# Patient Record
Sex: Female | Born: 1935 | Race: Black or African American | Hispanic: No | Smoking: Former smoker
Health system: Southern US, Community
[De-identification: ages and names within clinical notes are randomized; demographics above are authoritative.]

## PROBLEM LIST (undated history)

## (undated) DIAGNOSIS — F32A Depression, unspecified: Secondary | ICD-10-CM

## (undated) DIAGNOSIS — I209 Angina pectoris, unspecified: Secondary | ICD-10-CM

## (undated) DIAGNOSIS — D649 Anemia, unspecified: Secondary | ICD-10-CM

## (undated) DIAGNOSIS — R51 Headache: Secondary | ICD-10-CM

## (undated) DIAGNOSIS — N2581 Secondary hyperparathyroidism of renal origin: Secondary | ICD-10-CM

## (undated) DIAGNOSIS — I10 Essential (primary) hypertension: Secondary | ICD-10-CM

## (undated) DIAGNOSIS — G5600 Carpal tunnel syndrome, unspecified upper limb: Secondary | ICD-10-CM

## (undated) DIAGNOSIS — K219 Gastro-esophageal reflux disease without esophagitis: Secondary | ICD-10-CM

## (undated) DIAGNOSIS — Z992 Dependence on renal dialysis: Secondary | ICD-10-CM

## (undated) DIAGNOSIS — I639 Cerebral infarction, unspecified: Secondary | ICD-10-CM

## (undated) DIAGNOSIS — N186 End stage renal disease: Secondary | ICD-10-CM

## (undated) DIAGNOSIS — Z8719 Personal history of other diseases of the digestive system: Secondary | ICD-10-CM

## (undated) DIAGNOSIS — B1920 Unspecified viral hepatitis C without hepatic coma: Secondary | ICD-10-CM

## (undated) DIAGNOSIS — F329 Major depressive disorder, single episode, unspecified: Secondary | ICD-10-CM

## (undated) DIAGNOSIS — M1990 Unspecified osteoarthritis, unspecified site: Secondary | ICD-10-CM

## (undated) DIAGNOSIS — J189 Pneumonia, unspecified organism: Secondary | ICD-10-CM

## (undated) DIAGNOSIS — Z9981 Dependence on supplemental oxygen: Secondary | ICD-10-CM

## (undated) DIAGNOSIS — F419 Anxiety disorder, unspecified: Secondary | ICD-10-CM

## (undated) HISTORY — PX: ANKLE RECONSTRUCTION: SHX1151

## (undated) HISTORY — PX: ABDOMINAL HYSTERECTOMY: SHX81

## (undated) HISTORY — PX: CARDIAC CATHETERIZATION: SHX172

## (undated) HISTORY — PX: CATARACT EXTRACTION, BILATERAL: SHX1313

## (undated) HISTORY — PX: SP AV DIALYSIS SHUNT ACCESS EXISTING *L*: HXRAD383

---

## 2012-03-25 ENCOUNTER — Inpatient Hospital Stay: Admit: 2012-03-25 | Payer: Self-pay | Admitting: Family Medicine

## 2012-08-06 ENCOUNTER — Emergency Department (HOSPITAL_COMMUNITY): Payer: Medicare Other

## 2012-08-06 ENCOUNTER — Encounter (HOSPITAL_COMMUNITY): Payer: Self-pay

## 2012-08-06 ENCOUNTER — Inpatient Hospital Stay (HOSPITAL_COMMUNITY)
Admission: EM | Admit: 2012-08-06 | Discharge: 2012-08-11 | DRG: 539 | Disposition: A | Discharge status: Skilled Nursing Facility | Payer: Medicare Other | Attending: Internal Medicine | Admitting: Internal Medicine

## 2012-08-06 DIAGNOSIS — I959 Hypotension, unspecified: Secondary | ICD-10-CM | POA: Diagnosis not present

## 2012-08-06 DIAGNOSIS — Z91158 Patient's noncompliance with renal dialysis for other reason: Secondary | ICD-10-CM

## 2012-08-06 DIAGNOSIS — F329 Major depressive disorder, single episode, unspecified: Secondary | ICD-10-CM | POA: Diagnosis present

## 2012-08-06 DIAGNOSIS — G8929 Other chronic pain: Secondary | ICD-10-CM | POA: Diagnosis present

## 2012-08-06 DIAGNOSIS — I10 Essential (primary) hypertension: Secondary | ICD-10-CM

## 2012-08-06 DIAGNOSIS — M519 Unspecified thoracic, thoracolumbar and lumbosacral intervertebral disc disorder: Secondary | ICD-10-CM | POA: Diagnosis present

## 2012-08-06 DIAGNOSIS — N186 End stage renal disease: Secondary | ICD-10-CM | POA: Diagnosis present

## 2012-08-06 DIAGNOSIS — R29810 Facial weakness: Secondary | ICD-10-CM | POA: Diagnosis present

## 2012-08-06 DIAGNOSIS — D649 Anemia, unspecified: Secondary | ICD-10-CM | POA: Diagnosis present

## 2012-08-06 DIAGNOSIS — Z515 Encounter for palliative care: Secondary | ICD-10-CM

## 2012-08-06 DIAGNOSIS — I120 Hypertensive chronic kidney disease with stage 5 chronic kidney disease or end stage renal disease: Secondary | ICD-10-CM | POA: Diagnosis present

## 2012-08-06 DIAGNOSIS — F3289 Other specified depressive episodes: Secondary | ICD-10-CM | POA: Diagnosis present

## 2012-08-06 DIAGNOSIS — N2581 Secondary hyperparathyroidism of renal origin: Secondary | ICD-10-CM | POA: Diagnosis present

## 2012-08-06 DIAGNOSIS — F411 Generalized anxiety disorder: Secondary | ICD-10-CM | POA: Diagnosis not present

## 2012-08-06 DIAGNOSIS — Z992 Dependence on renal dialysis: Secondary | ICD-10-CM

## 2012-08-06 DIAGNOSIS — M869 Osteomyelitis, unspecified: Principal | ICD-10-CM | POA: Diagnosis present

## 2012-08-06 DIAGNOSIS — K219 Gastro-esophageal reflux disease without esophagitis: Secondary | ICD-10-CM | POA: Diagnosis present

## 2012-08-06 DIAGNOSIS — K5900 Constipation, unspecified: Secondary | ICD-10-CM | POA: Diagnosis not present

## 2012-08-06 DIAGNOSIS — Z79899 Other long term (current) drug therapy: Secondary | ICD-10-CM

## 2012-08-06 DIAGNOSIS — G5600 Carpal tunnel syndrome, unspecified upper limb: Secondary | ICD-10-CM | POA: Diagnosis present

## 2012-08-06 DIAGNOSIS — R29898 Other symptoms and signs involving the musculoskeletal system: Secondary | ICD-10-CM | POA: Diagnosis present

## 2012-08-06 DIAGNOSIS — M129 Arthropathy, unspecified: Secondary | ICD-10-CM | POA: Diagnosis present

## 2012-08-06 DIAGNOSIS — Z66 Do not resuscitate: Secondary | ICD-10-CM | POA: Diagnosis not present

## 2012-08-06 DIAGNOSIS — D72819 Decreased white blood cell count, unspecified: Secondary | ICD-10-CM | POA: Diagnosis present

## 2012-08-06 DIAGNOSIS — R531 Weakness: Secondary | ICD-10-CM

## 2012-08-06 DIAGNOSIS — I69998 Other sequelae following unspecified cerebrovascular disease: Secondary | ICD-10-CM

## 2012-08-06 DIAGNOSIS — M4640 Discitis, unspecified, site unspecified: Secondary | ICD-10-CM | POA: Diagnosis present

## 2012-08-06 DIAGNOSIS — M549 Dorsalgia, unspecified: Secondary | ICD-10-CM

## 2012-08-06 DIAGNOSIS — Z9115 Patient's noncompliance with renal dialysis: Secondary | ICD-10-CM

## 2012-08-06 DIAGNOSIS — Z87891 Personal history of nicotine dependence: Secondary | ICD-10-CM

## 2012-08-06 DIAGNOSIS — E875 Hyperkalemia: Secondary | ICD-10-CM | POA: Diagnosis present

## 2012-08-06 DIAGNOSIS — I330 Acute and subacute infective endocarditis: Secondary | ICD-10-CM | POA: Diagnosis present

## 2012-08-06 HISTORY — DX: Carpal tunnel syndrome, unspecified upper limb: G56.00

## 2012-08-06 HISTORY — DX: Secondary hyperparathyroidism of renal origin: N25.81

## 2012-08-06 HISTORY — DX: Gastro-esophageal reflux disease without esophagitis: K21.9

## 2012-08-06 HISTORY — DX: Essential (primary) hypertension: I10

## 2012-08-06 HISTORY — DX: End stage renal disease: N18.6

## 2012-08-06 LAB — RENAL FUNCTION PANEL
Calcium: 9.3 mg/dL (ref 8.4–10.5)
Creatinine, Ser: 9.61 mg/dL — ABNORMAL HIGH (ref 0.50–1.10)
GFR calc Af Amer: 4 mL/min — ABNORMAL LOW (ref >90)
GFR calc non Af Amer: 3 mL/min — ABNORMAL LOW (ref >90)
Glucose, Bld: 79 mg/dL (ref 70–99)
Phosphorus: 8.3 mg/dL — ABNORMAL HIGH (ref 2.3–4.6)
Sodium: 131 mEq/L — ABNORMAL LOW (ref 135–145)

## 2012-08-06 LAB — CBC
HCT: 31.2 % — ABNORMAL LOW (ref 36.0–46.0)
Hemoglobin: 10.3 g/dL — ABNORMAL LOW (ref 12.0–15.0)
MCV: 88.4 fL (ref 78.0–100.0)
WBC: 3.5 10*3/uL — ABNORMAL LOW (ref 4.0–10.5)

## 2012-08-06 LAB — BASIC METABOLIC PANEL
BUN: 81 mg/dL — ABNORMAL HIGH (ref 6–23)
CO2: 22 mEq/L (ref 19–32)
Chloride: 91 mEq/L — ABNORMAL LOW (ref 96–112)
Creatinine, Ser: 9.45 mg/dL — ABNORMAL HIGH (ref 0.50–1.10)
Glucose, Bld: 92 mg/dL (ref 70–99)
Potassium: 5.8 mEq/L — ABNORMAL HIGH (ref 3.5–5.1)

## 2012-08-06 LAB — HEPATIC FUNCTION PANEL
ALT: 29 U/L (ref 0–35)
AST: 16 U/L (ref 0–37)
Alkaline Phosphatase: 86 U/L (ref 39–117)
Bilirubin, Direct: 0.1 mg/dL (ref 0.0–0.3)
Total Bilirubin: 0.3 mg/dL (ref 0.3–1.2)

## 2012-08-06 MED ORDER — HYDROCODONE-ACETAMINOPHEN 5-325 MG PO TABS
1.0000 | ORAL_TABLET | ORAL | Status: DC | PRN
  Administered 2012-08-07: 2 via ORAL
  Filled 2012-08-06 (×2): qty 2

## 2012-08-06 MED ORDER — HEPARIN SODIUM (PORCINE) 5000 UNIT/ML IJ SOLN
5000.0000 [IU] | Freq: Three times a day (TID) | INTRAMUSCULAR | Status: DC
Start: 2012-08-06 — End: 2012-08-11
  Administered 2012-08-07 – 2012-08-10 (×3): 5000 [IU] via SUBCUTANEOUS
  Filled 2012-08-06 (×17): qty 1

## 2012-08-06 MED ORDER — ONDANSETRON HCL 4 MG/2ML IJ SOLN: 4.0000 mg | Freq: Three times a day (TID) | INTRAMUSCULAR | Status: AC | PRN

## 2012-08-06 MED ORDER — IOHEXOL 300 MG/ML  SOLN: 25.0000 mL | INTRAMUSCULAR | Status: AC

## 2012-08-06 MED ORDER — DARBEPOETIN ALFA-POLYSORBATE 60 MCG/0.3ML IJ SOLN
60.0000 ug | INTRAMUSCULAR | Status: DC
  Administered 2012-08-06: 60 ug via INTRAVENOUS
  Filled 2012-08-06: qty 0.3

## 2012-08-06 MED ORDER — SODIUM CHLORIDE 0.9 % IJ SOLN
3.0000 mL | Freq: Two times a day (BID) | INTRAMUSCULAR | Status: DC
  Administered 2012-08-07 – 2012-08-11 (×10): 3 mL via INTRAVENOUS

## 2012-08-06 MED ORDER — POLYETHYLENE GLYCOL 3350 17 G PO PACK
17.0000 g | PACK | Freq: Every day | ORAL | Status: DC | PRN
  Filled 2012-08-06: qty 1

## 2012-08-06 MED ORDER — HYDROMORPHONE HCL PF 1 MG/ML IJ SOLN: 1.0000 mg | Freq: Once | INTRAMUSCULAR | Status: DC

## 2012-08-06 MED ORDER — CALCIUM ACETATE 667 MG PO CAPS
2001.0000 mg | ORAL_CAPSULE | Freq: Three times a day (TID) | ORAL | Status: DC
  Administered 2012-08-07 – 2012-08-10 (×10): 2001 mg via ORAL
  Filled 2012-08-06 (×16): qty 3

## 2012-08-06 MED ORDER — SODIUM CHLORIDE 0.9 % IV BOLUS (SEPSIS)
500.0000 mL | Freq: Once | INTRAVENOUS | Status: AC
  Administered 2012-08-06: 14:00:00 via INTRAVENOUS

## 2012-08-06 MED ORDER — RENA-VITE PO TABS
1.0000 | ORAL_TABLET | Freq: Every day | ORAL | Status: DC
  Administered 2012-08-07 – 2012-08-08 (×2): 1 via ORAL
  Administered 2012-08-10: 18:00:00 via ORAL
  Filled 2012-08-06 (×6): qty 1

## 2012-08-06 MED ORDER — PIPERACILLIN-TAZOBACTAM 3.375 G IVPB: 3.3750 g | Freq: Once | INTRAVENOUS | Status: DC

## 2012-08-06 MED ORDER — SODIUM POLYSTYRENE SULFONATE 15 GM/60ML PO SUSP: 45.0000 g | Freq: Once | ORAL | Status: DC

## 2012-08-06 MED ORDER — HYDROMORPHONE HCL PF 1 MG/ML IJ SOLN
1.0000 mg | INTRAMUSCULAR | Status: AC | PRN
  Administered 2012-08-06 – 2012-08-07 (×3): 1 mg via INTRAVENOUS
  Filled 2012-08-06: qty 1

## 2012-08-06 MED ORDER — DOXERCALCIFEROL 4 MCG/2ML IV SOLN
4.0000 ug | INTRAVENOUS | Status: DC | PRN
  Administered 2012-08-06 – 2012-08-11 (×3): 4 ug via INTRAVENOUS
  Filled 2012-08-06: qty 2

## 2012-08-06 NOTE — ED Notes (Signed)
IV RN at bedside to attempt IV access.

## 2012-08-06 NOTE — ED Notes (Addendum)
Per [facility] pt c/o lower abd/back pain, increase back pain w/movement, dizziness and weakness x5 days. Pt usually receives dialysis Tuesday, Thursday, and Saturday but has only had dialysis Wednesday of this week due to "not feeling well." 22 g Right Hand, 12 non remarkable, CBG 85, VSS  Pt also c/o SOB w/exertion

## 2012-08-06 NOTE — ED Notes (Signed)
PIV replacement attempted by L. Shular, RN x 1: unsuccessful.  Pt declines further needle sticks.

## 2012-08-06 NOTE — ED Notes (Signed)
IV team will come to attempt IV placement

## 2012-08-06 NOTE — Progress Notes (Signed)
Received pt. From ED.Pt. Is from home alone.She uses a walker at home.pt. Alert and oriented.Pt.'s skin with out any skin sore.Keep monitoring pt. Closely and assessing her needs.

## 2012-08-06 NOTE — H&P (Addendum)
[facility] History and Physical  Julia Small FAO:[id number] DOB: 30-Oct-1935 DOA: 08/06/2012  Referring physician: Dr. Rosalia Hammers PCP: No primary provider on file.  Specialists: Neurosurgery, Nephrology  Chief Complaint: back pain  HPI: Julia Small is a 77 y.o. female has a past medical history significant for end-stage renal disease on hemodialysis TTS, with her last hemodialysis Saturday, also history of hypertension, previous CVA, GERD presents with a chief complaint of back pain and weakness for the past 5 days. Her complaints are somewhat diffuse, however endorses nausea, generalized weakness with difficulties walking and fatigue. She missed her dialysis sessions this week due to these complaints. She lives in [place]. She denies any fevers, endorses two episodes of chills in the past 2 days. She denies any chest pain, shortness of breath. She denies any abdominal pain. She has a small cough which is somewhat chronic, she's having troubles coughing because every time she tries to cough she has back pain. In the emergency room, patient is afebrile, initially tachycardic then her heart rate is in the 70s, initial blood pressure 84/52, improved after small 500cc bolus and now consistently > 100 systolic. Lab evaluation showed hyperkalemia at 5.8, BUN 81 , creatinine 9.4, mild leukopenia with a white count of 3.5, and mild anemia. Because of her diffuse GI complaints and back pain, she had a CT scan which showed findings worrisome for acute discitis/osteomyelitis of the T10-11 region. Hospitalist service has been asked to admit patient.  Review of Systems: As per history of present illness, otherwise negative.  Past Medical History  Diagnosis Date  . Renal disorder   . Hypertension   . GERD (gastroesophageal reflux disease)   . Arthritis   . Renal failure   . CVA (cerebral infarction)     Left side weakness and facial droop  . Carpal tunnel syndrome    Past Surgical History   Procedure Laterality Date  . Ankle reconstruction    . Cesarean section      three  . Sp av dialysis shunt access existing *l* Left   . Abdominal hysterectomy     Social History:  reports that she has quit smoking. She does not have any smokeless tobacco history on file. She reports that  drinks alcohol. She reports that she does not use illicit drugs.  No Known Allergies  History reviewed. No pertinent family history.   Prior to Admission medications   Medication Sig Start Date End Date Taking? Authorizing Provider  amLODipine (NORVASC) 10 MG tablet Take 10 mg by mouth daily.   Yes Historical Provider, MD  b complex-vitamin c-folic acid (NEPHRO-VITE) 0.8 MG TABS Take 0.8 mg by mouth daily.   Yes Historical Provider, MD  calcium acetate (PHOSLO) 667 MG capsule Take 1,334 mg by mouth 3 (three) times daily with meals.    Yes Historical Provider, MD  carvedilol (COREG) 6.25 MG tablet Take 6.25 mg by mouth 2 (two) times daily.   Yes Historical Provider, MD  cloNIDine (CATAPRES) 0.1 MG tablet Take 0.1 mg by mouth 2 (two) times daily.   Yes Historical Provider, MD  esomeprazole (NEXIUM) 40 MG capsule Take 40 mg by mouth daily.   Yes Historical Provider, MD  HYDROcodone-acetaminophen (NORCO) 7.5-325 MG per tablet Take 1 tablet by mouth 3 (three) times daily as needed for pain.    Yes Historical Provider, MD  ranitidine (ZANTAC) 150 MG tablet Take 150 mg by mouth 2 (two) times daily.   Yes Historical Provider, MD  rOPINIRole (REQUIP) 0.5 MG tablet  Take 0.5 mg by mouth daily.   Yes Historical Provider, MD  zolpidem (AMBIEN) 10 MG tablet Take 10 mg by mouth at bedtime as needed for sleep.   Yes Historical Provider, MD   Physical Exam: Filed Vitals:   08/06/12 1300 08/06/12 1315 08/06/12 1403 08/06/12 1415  BP: 115/66 108/68  126/78  Pulse: 72 74  73  Temp:   98.7 F (37.1 C)   TempSrc:   Rectal   Resp: 20 23  23   Height:      Weight:      SpO2: 96% 100%  97%     General:  No apparent  distress, pleasant African American woman  Eyes: no scleral icterus  ENT: moist oropharynx  Neck: supple, no JVD  Cardiovascular: regular rate without MRG; 2+ peripheral pulses  Respiratory: CTA biL, good air movement without wheezing, rhonchi or crackled  Abdomen: soft, non tender to palpation, positive bowel sounds, no guarding, no rebound  Skin: no rashes  Musculoskeletal: no peripheral edema  Psychiatric: normal mood and affect  Neurologic: Nonfocal  Labs on Admission:  Basic Metabolic Panel:  Recent Labs Lab 08/06/12 1312  NA 132*  K 5.8*  CL 91*  CO2 22  GLUCOSE 92  BUN 81*  CREATININE 9.45*  CALCIUM 9.7   Liver Function Tests:  Recent Labs Lab 08/06/12 1200  AST 16  ALT 29  ALKPHOS 86  BILITOT 0.3  PROT 7.6  ALBUMIN 2.9*   CBC:  Recent Labs Lab 08/06/12 1312  WBC 3.5*  HGB 10.3*  HCT 31.2*  MCV 88.4  PLT PLATELET CLUMPS NOTED ON SMEAR, UNABLE TO ESTIMATE   Radiological Exams on Admission: Ct Abdomen Pelvis Wo Contrast  08/06/2012   *RADIOLOGY REPORT*  Clinical Data: Diffuse abdominal and back pain  CT ABDOMEN AND PELVIS WITHOUT CONTRAST  Technique:  Multidetector CT imaging of the abdomen and pelvis was performed following the standard protocol without intravenous contrast.  Comparison: CT abdomen pelvis - 04/17/2008  Findings:  The lack of intravenous contrast limits the ability to evaluate solid abdominal organs.  There has been interval destruction of the T10 - T11 intervertebral disc space with associated increase sclerosis and irregularity of the adjacent endplates. There is minimal (approximately 5 mm) of herniated disc material towards the spinal canal (axial image 11, series 2) and approximately 25% loss of the T11 vertebral body height.  These findings are associated with development of adjacent paraspinal soft tissue stranding and thickening (images 8 through 13, series 2) and are worrisome for acute diskitis osteomyelitis.  Previously  levels of diskitis osteomyelitis at T9 - T10 and L4 - L5 appear grossly unchanged, however there is now near complete bony fusion of the T9 - T10 intervertebral disc space.  Normal hepatic contour.  Normal noncontrast appearance of the decompressed gallbladder.  No ascites.  The bilateral kidneys are atrophic compatible with provided history of end-stage renal disease.  There is a possible 1.6 x 1.9 cm hypoattenuating lesion arising from the superior pole of the left kidney which is incompletely evaluated on this noncontrast examination (image 23, series 2).  No definite urinary obstruction. No perinephric stranding.  There is mild diffuse thickening of the left adrenal gland without discrete nodule.  Normal noncontrast appearance of the right adrenal gland, pancreas and spleen.  Ingested enteric contrast extends to the level of the distal small bowel.  No evidence of enteric obstruction.  Bowel is normal in course and caliber without discrete area of wall thickening.  No pneumoperitoneum, pneumatosis or portal venous gas.  Atherosclerotic plaque within a tortuous and normal caliber abdominal aorta.  No definite bulky retroperitoneal, mesenteric, pelvic or inguinal lymphadenopathy on this noncontrast examination.  Post hysterectomy.  No discrete adnexal lesion.  No free fluid in the pelvis.  Limited visualization of the lower thorax demonstrates worsening bibasilar opacities.  No definite pleural effusion.  Cardiomegaly.  Calcifications within the mitral valve annulus.  No definite pericardial effusion.  Multiple ventral abdominal wall metallic sutures are redemonstrated.  IMPRESSION: 1.  Findings worrisome for acute diskitis osteomyelitis involving the T10 - T11 intervertebral disc space.  Further evaluation with MRI may be performed as clinically indicated.  2.  Grossly unchanged sequela of prior presumed areas of diskitis osteomyelitis involving the T9 - T10 and L4 - L5 intervertebral disc space levels.  3.   Cardiomegaly.  4.  Indeterminate approximately 1.7 cm left-sided hypoattenuating lesion, incompletely evaluated on this noncontrast examination but may represent a renal cyst.  Further evaluation with non emergent renal ultrasound may be performed as clinically indicated.  Above findings discussed with Geiple, PA at 1526.   Original Report Authenticated By: Tacey Ruiz, MD   Dg Chest 2 View  08/06/2012   *RADIOLOGY REPORT*  Clinical Data: Cough.  CHEST - 2 VIEW  Comparison: 03/27/2012  Findings: The cardiopericardial silhouette is enlarged.  Vascular congestion noted with patchy atelectasis or infiltrate bilaterally, left greater than right. Telemetry leads overlie the chest. Imaged bony structures of the thorax are intact.  IMPRESSION: No substantial change.  Cardiomegaly with vascular congestion and bilateral lower lung patchy atelectasis or infiltrate.   Original Report Authenticated By: Kennith Center, M.D.   Assessment/Plan Active Problems:   ESRD (end stage renal disease) on dialysis   Osteomyelitis   Diskitis   HTN (hypertension)   GERD (gastroesophageal reflux disease)    End-stage renal disease on hemodialysis - Patient missed her dialysis sessions this week, nephrology has been consulted, appreciate input. - I don't have access to her baseline labs; per patient dry weight is 133 lbs, now 135.  Hyperkalemia - Again I'm not sure about her regular potassium levels. EKG does show very mild peaked T waves. Will keep patient on telemetry. Kayexalate. HD per renal  Osteomyelitis/diskitis - Patient not toxic looking, now normotensive and afebrile, will hold antibiotics hopefully will be able to do CT guided biopsy prior to antibiotics. Talked to IR and plan for tomorrow morning 9 am. NPO past midnight.  - low threshold to start antibiotics if needed.  HTN - given initial hypotension we'll hold antihypertensives. They may need to be restarted soon.  Anemia - Of chronic kidney  disease.  Progressive weakness - somewhat acute on chronic, uses a walker at home - PT consult.   GERD - continue PPI  DVT Prophylaxis - heparin   Code Status: Full  Family Communication: none  Disposition Plan: inpatient.  Time spent: 24  Saina Waage M. Elvera Lennox, MD [facility] Pager [phone]  If 7PM-7AM, please contact night-coverage [email or web address] Password [facility] 08/06/2012, 4:28 PM

## 2012-08-06 NOTE — Procedures (Signed)
I was present at this session.  I have reviewed the session itself and made appropriate changes.  Mild dyspnea, vol xs. LUA AVF  Julia Small L 7/18/20146:26 PM

## 2012-08-06 NOTE — ED Provider Notes (Signed)
History    CSN: [id number] Arrival date & time 08/06/12  1145  First MD Initiated Contact with Patient 08/06/12 1225     Chief Complaint  Patient presents with  . Weakness   (Consider location/radiation/quality/duration/timing/severity/associated sxs/prior Treatment) HPI 77 y.o. Female on dialysis, h.o. Cva, hypertension here complaining of weakness for 5 days.  Patient states she feels sick with nausea and dizziness, difficulty walking due to generalized.  Patient normally goes dialysis T,Th, Saturday.  Patient went to dialysis Saturday.  She did not go to dialysis Tuesday due to feeling very weak.  She went Wednesday because she knew she had to. She states they told her they wouldn't have room for her today.  Patient goes to dialysis in [place].  Dr. Park Breed at [facility].  Nepghrology was [facility].  Patient endorses headache, cough, nausea, decreased po intake partly due to too weak to prepare meals, no fever known, no chills, no chest pain.  Patient does not make urine.  Patient has bilateral low back pain began about a week ago.  No trauma to back known.  Pain increases with movement.  No history of back pain or problems.   Past Medical History  Diagnosis Date  . Renal disorder   . Hypertension   . GERD (gastroesophageal reflux disease)   . Arthritis   . Renal failure   . CVA (cerebral infarction)     Left side weakness and facial droop  . Carpal tunnel syndrome    Past Surgical History  Procedure Laterality Date  . Ankle reconstruction    . Cesarean section      three  . Sp av dialysis shunt access existing *l* Left   . Abdominal hysterectomy     History reviewed. No pertinent family history. History  Substance Use Topics  . Smoking status: Former Games developer  . Smokeless tobacco: Not on file  . Alcohol Use: Yes     Comment: occasional   OB History   Grav Para Term Preterm Abortions TAB SAB Ect Mult Living   3 3 3       3      Review of Systems  All other systems reviewed  and are negative.    Allergies  Review of patient's allergies indicates no known allergies.  Home Medications   Current Outpatient Rx  Name  Route  Sig  Dispense  Refill  . AMLODIPINE BESYLATE PO   Oral   Take 2 mg by mouth.         . calcium acetate (PHOSLO) 667 MG capsule   Oral   Take 667 mg by mouth 3 (three) times daily with meals.         Marland Kitchen HYDROcodone-acetaminophen (NORCO) 7.5-325 MG per tablet   Oral   Take 1 tablet by mouth every 6 (six) hours as needed for pain.         Marland Kitchen oxyCODONE-acetaminophen (PERCOCET/ROXICET) 5-325 MG per tablet   Oral   Take 1 tablet by mouth every 4 (four) hours as needed for pain.         . ropinirole (REQUIP) 5 MG tablet   Oral   Take 5 mg by mouth at bedtime.         Marland Kitchen zolpidem (AMBIEN) 10 MG tablet   Oral   Take 10 mg by mouth at bedtime as needed for sleep.          BP 84/52  Pulse 70  Temp(Src) 97.8 F (36.6 C) (Oral)  Resp 14  Ht 4\' 10"  (1.473 m)  Wt 135 lb (61.236 kg)  BMI 28.22 kg/m2  SpO2 100% Physical Exam  Nursing note and vitals reviewed. Constitutional: She is oriented to person, place, and time. She appears well-developed and well-nourished.  HENT:  Head: Normocephalic and atraumatic.  Right Ear: Tympanic membrane and external ear normal.  Left Ear: Tympanic membrane and external ear normal.  Nose: Nose normal. Right sinus exhibits no maxillary sinus tenderness and no frontal sinus tenderness. Left sinus exhibits no maxillary sinus tenderness and no frontal sinus tenderness.  Mouth/Throat: Oropharynx is clear and moist.  Eyes: Conjunctivae and EOM are normal. Pupils are equal, round, and reactive to light. Right eye exhibits no nystagmus. Left eye exhibits no nystagmus.  Neck: Normal range of motion. Neck supple.  Cardiovascular: Normal rate, regular rhythm, normal heart sounds and intact distal pulses.   Pulmonary/Chest: Effort normal and breath sounds normal. No respiratory distress. She exhibits  no tenderness.  Abdominal: Soft. Bowel sounds are normal. She exhibits no distension and no mass. There is no tenderness.  Diffuse ttp  Musculoskeletal: Normal range of motion. She exhibits edema. She exhibits no tenderness.  Patient without pain on rom but states too weak to arom  Neurological: She is alert and oriented to person, place, and time. She has normal strength and normal reflexes. No sensory deficit. She displays a negative Romberg sign. GCS eye subscore is 4. GCS verbal subscore is 5. GCS motor subscore is 6.  Reflex Scores:      Tricep reflexes are 2+ on the right side and 2+ on the left side.      Bicep reflexes are 2+ on the right side and 2+ on the left side.      Brachioradialis reflexes are 2+ on the right side and 2+ on the left side.      Patellar reflexes are 2+ on the right side and 2+ on the left side.      Achilles reflexes are 2+ on the right side and 2+ on the left side. Strength is 4/5 bilateral hip flexors, knee, ankle and toe 5/5 bilaterally, no sensory deficit.  DTR equal bilateral knees and ankles.   Skin: Skin is warm and dry. No rash noted.  Psychiatric: Her speech is normal. Judgment and thought content normal. Her affect is angry. She is agitated. Cognition and memory are normal.    ED Course  Procedures (including critical care time) Labs Reviewed  CBC  BASIC METABOLIC PANEL   No results found. No diagnosis found. Results for orders placed during the hospital encounter of 08/06/12  CBC      Result Value Range   WBC 3.5 (*) 4.0 - 10.5 K/uL   RBC 3.53 (*) 3.87 - 5.11 MIL/uL   Hemoglobin 10.3 (*) 12.0 - 15.0 g/dL   HCT 56.2 (*) 13.0 - 86.5 %   MCV 88.4  78.0 - 100.0 fL   MCH 29.2  26.0 - 34.0 pg   MCHC 33.0  30.0 - 36.0 g/dL   RDW 78.4  69.6 - 29.5 %   Platelets PLATELET CLUMPS NOTED ON SMEAR, UNABLE TO ESTIMATE  150 - 400 K/uL  BASIC METABOLIC PANEL      Result Value Range   Sodium 132 (*) 135 - 145 mEq/L   Potassium 5.8 (*) 3.5 - 5.1 mEq/L    Chloride 91 (*) 96 - 112 mEq/L   CO2 22  19 - 32 mEq/L   Glucose, Bld 92  70 - 99 mg/dL  BUN 81 (*) 6 - 23 mg/dL   Creatinine, Ser 4.09 (*) 0.50 - 1.10 mg/dL   Calcium 9.7  8.4 - 81.1 mg/dL   GFR calc non Af Amer 4 (*) >90 mL/min   GFR calc Af Amer 4 (*) >90 mL/min  HEPATIC FUNCTION PANEL      Result Value Range   Total Protein 7.6  6.0 - 8.3 g/dL   Albumin 2.9 (*) 3.5 - 5.2 g/dL   AST 16  0 - 37 U/L   ALT 29  0 - 35 U/L   Alkaline Phosphatase 86  39 - 117 U/L   Total Bilirubin 0.3  0.3 - 1.2 mg/dL   Bilirubin, Direct 0.1  0.0 - 0.3 mg/dL   Indirect Bilirubin 0.2 (*) 0.3 - 0.9 mg/dL  CG4 I-STAT (LACTIC ACID)      Result Value Range   Lactic Acid, Venous 1.20  0.5 - 2.2 mmol/L   Filed Vitals:   08/06/12 1415  BP: 126/78  Pulse: 73  Temp:   Resp: 23   Filed Vitals:   08/06/12 1300 08/06/12 1315 08/06/12 1403 08/06/12 1415  BP: 115/66 108/68  126/78  Pulse: 72 74  73  Temp:   98.7 F (37.1 C)   TempSrc:   Rectal   Resp: 20 23  23   Height:      Weight:      SpO2: 96% 100%  97%   Ct Abdomen Pelvis Wo Contrast  08/06/2012   *RADIOLOGY REPORT*  Clinical Data: Diffuse abdominal and back pain  CT ABDOMEN AND PELVIS WITHOUT CONTRAST  Technique:  Multidetector CT imaging of the abdomen and pelvis was performed following the standard protocol without intravenous contrast.  Comparison: CT abdomen pelvis - 04/17/2008  Findings:  The lack of intravenous contrast limits the ability to evaluate solid abdominal organs.  There has been interval destruction of the T10 - T11 intervertebral disc space with associated increase sclerosis and irregularity of the adjacent endplates. There is minimal (approximately 5 mm) of herniated disc material towards the spinal canal (axial image 11, series 2) and approximately 25% loss of the T11 vertebral body height.  These findings are associated with development of adjacent paraspinal soft tissue stranding and thickening (images 8 through 13, series 2)  and are worrisome for acute diskitis osteomyelitis.  Previously levels of diskitis osteomyelitis at T9 - T10 and L4 - L5 appear grossly unchanged, however there is now near complete bony fusion of the T9 - T10 intervertebral disc space.  Normal hepatic contour.  Normal noncontrast appearance of the decompressed gallbladder.  No ascites.  The bilateral kidneys are atrophic compatible with provided history of end-stage renal disease.  There is a possible 1.6 x 1.9 cm hypoattenuating lesion arising from the superior pole of the left kidney which is incompletely evaluated on this noncontrast examination (image 23, series 2).  No definite urinary obstruction. No perinephric stranding.  There is mild diffuse thickening of the left adrenal gland without discrete nodule.  Normal noncontrast appearance of the right adrenal gland, pancreas and spleen.  Ingested enteric contrast extends to the level of the distal small bowel.  No evidence of enteric obstruction.  Bowel is normal in course and caliber without discrete area of wall thickening.  No pneumoperitoneum, pneumatosis or portal venous gas.  Atherosclerotic plaque within a tortuous and normal caliber abdominal aorta.  No definite bulky retroperitoneal, mesenteric, pelvic or inguinal lymphadenopathy on this noncontrast examination.  Post hysterectomy.  No discrete adnexal lesion.  No free fluid in the pelvis.  Limited visualization of the lower thorax demonstrates worsening bibasilar opacities.  No definite pleural effusion.  Cardiomegaly.  Calcifications within the mitral valve annulus.  No definite pericardial effusion.  Multiple ventral abdominal wall metallic sutures are redemonstrated.  IMPRESSION: 1.  Findings worrisome for acute diskitis osteomyelitis involving the T10 - T11 intervertebral disc space.  Further evaluation with MRI may be performed as clinically indicated.  2.  Grossly unchanged sequela of prior presumed areas of diskitis osteomyelitis involving the  T9 - T10 and L4 - L5 intervertebral disc space levels.  3.  Cardiomegaly.  4.  Indeterminate approximately 1.7 cm left-sided hypoattenuating lesion, incompletely evaluated on this noncontrast examination but may represent a renal cyst.  Further evaluation with non emergent renal ultrasound may be performed as clinically indicated.  Above findings discussed with Geiple, PA at 1526.   Original Report Authenticated By: Tacey Ruiz, MD   Dg Chest 2 View  08/06/2012   *RADIOLOGY REPORT*  Clinical Data: Cough.  CHEST - 2 VIEW  Comparison: 03/27/2012  Findings: The cardiopericardial silhouette is enlarged.  Vascular congestion noted with patchy atelectasis or infiltrate bilaterally, left greater than right. Telemetry leads overlie the chest. Imaged bony structures of the thorax are intact.  IMPRESSION: No substantial change.  Cardiomegaly with vascular congestion and bilateral lower lung patchy atelectasis or infiltrate.   Original Report Authenticated By: Kennith Center, M.D.   MDM  77 y.o. Female on dialysis presents today with generalized weakness who presented with initial hypotension with bp 84/52.  BP has increased with fluid bolus.  Patient with cough and possible bilateral lower lobe patchy pneumonia.  Patient started on empiric zosyn and vancomycin.  CT of abdomen done secondary to lower abdominal pain, bilateral flank pain and back pain.  CT positive for discitis and osteomyelitis at t10/11.  Plan consult to neurosurgery and hospitalist for admission and treatment.   1- discitis/osteomyelitis t10/11 2- pneumonia 3- weaknes- secondary to 1 and 2. 4- crf- patient will need dialysis.  5- hyperkalemia- secondary to #4, only mild at 5.8- EKg with some peaked t waves.  Plan kay exalate.    Patient care discussed with Dr. Elvera Lennox.  Plan admission to 6700 bed.  Neurosurgery has been paged for consult.  Dr. Reino Kent will consult nephrology.  Dr. Phoebe Perch answered page and advised that ir can be consulted and  patient managed medically unless evidence of cord involvement.    Hilario Quarry, MD 08/06/12 [phone]

## 2012-08-06 NOTE — Consult Note (Signed)
[facility] Renal Consultation Note    Indication for Consultation:  Management of ESRD/hemodialysis; anemia, hypertension/volume and secondary hyperparathyroidism  HPI: Julia Small is a 77 y.o. female ESRD patient (TTS HD) with past medical history significant for hypertension, diastolic dysfunction, CVA with left sided deficit, GERD, chronic pain and depression who presented to the ED via EMS with complaints of dizziness, weakness, abdominal and back pain x 5 days.  CT abdomen and pelvis is worrisome for acute diskitis osteomyelitis at T10-T-11 and left sided hypo attenuating lesion suspicious for renal cyst but not confirmed due to non-contrast study.  Julia Small relocated to [place] from [place] last year and has had a tenuous social and financial situation since the move. She has a history of chronic sign-offs that she attributes to unbearable pain to bilateral hands (L > R, stated as secondary to carpal tunnel.) She missed her regular dialysis sessions this week, reporting for only one of the make -up days offered by the outpatient center, for a total of 1 treatment since 07/29/12. Her K+ is 5.8 with a BUN of 81 and creatinine of 9.4. At the time of this encounter she is tearful, complaining of back pain, dizziness and leg weakness. She denies, HA, N/V/D or paresthesias.   Past Medical History  Diagnosis Date  . Renal disorder   . Hypertension   . GERD (gastroesophageal reflux disease)   . Arthritis   . Renal failure   . CVA (cerebral infarction)     Left side weakness and facial droop  . Carpal tunnel syndrome    Past Surgical History  Procedure Laterality Date  . Ankle reconstruction    . Cesarean section      three  . Sp av dialysis shunt access existing *l* Left   . Abdominal hysterectomy     History reviewed. No pertinent family history. Social History:  reports that she has quit smoking. She does not have any smokeless tobacco history on file.  She reports that  drinks alcohol. She reports that she does not use illicit drugs. No Known Allergies Prior to Admission medications   Medication Sig Start Date End Date Taking? Authorizing Provider  amLODipine (NORVASC) 10 MG tablet Take 10 mg by mouth daily.   Yes Historical Provider, MD  b complex-vitamin c-folic acid (NEPHRO-VITE) 0.8 MG TABS Take 0.8 mg by mouth daily.   Yes Historical Provider, MD  calcium acetate (PHOSLO) 667 MG capsule Take 1,334 mg by mouth 3 (three) times daily with meals.    Yes Historical Provider, MD  carvedilol (COREG) 6.25 MG tablet Take 6.25 mg by mouth 2 (two) times daily.   Yes Historical Provider, MD  cloNIDine (CATAPRES) 0.1 MG tablet Take 0.1 mg by mouth 2 (two) times daily.   Yes Historical Provider, MD  esomeprazole (NEXIUM) 40 MG capsule Take 40 mg by mouth daily.   Yes Historical Provider, MD  HYDROcodone-acetaminophen (NORCO) 7.5-325 MG per tablet Take 1 tablet by mouth 3 (three) times daily as needed for pain.    Yes Historical Provider, MD  ranitidine (ZANTAC) 150 MG tablet Take 150 mg by mouth 2 (two) times daily.   Yes Historical Provider, MD  rOPINIRole (REQUIP) 0.5 MG tablet Take 0.5 mg by mouth daily.   Yes Historical Provider, MD  zolpidem (AMBIEN) 10 MG tablet Take 10 mg by mouth at bedtime as needed for sleep.   Yes Historical Provider, MD   Current Facility-Administered Medications  Medication Dose Route Frequency Provider Last Rate  Last Dose  . doxercalciferol (HECTOROL) injection 4 mcg  4 mcg Intravenous Q hemodialysis Kerin Salen, PA-C      . heparin injection 5,000 Units  5,000 Units Subcutaneous Q8H Pamella Pert, MD      . HYDROcodone-acetaminophen (NORCO/VICODIN) 5-325 MG per tablet 1-2 tablet  1-2 tablet Oral Q4H PRN Pamella Pert, MD      . HYDROmorphone (DILAUDID) injection 1 mg  1 mg Intravenous Once Hilario Quarry, MD      . HYDROmorphone (DILAUDID) injection 1 mg  1 mg Intravenous Q4H PRN Hilario Quarry, MD      .  ondansetron [facility]) injection 4 mg  4 mg Intravenous Q8H PRN Hilario Quarry, MD      . piperacillin-tazobactam (ZOSYN) IVPB 3.375 g  3.375 g Intravenous Once Hilario Quarry, MD      . polyethylene glycol (MIRALAX / GLYCOLAX) packet 17 g  17 g Oral Daily PRN Costin Gherghe, MD      . sodium chloride 0.9 % injection 3 mL  3 mL Intravenous Q12H Costin Gherghe, MD      . sodium polystyrene (KAYEXALATE) 15 GM/60ML suspension 45 g  45 g Oral Once Hilario Quarry, MD       Current Outpatient Prescriptions  Medication Sig Dispense Refill  . amLODipine (NORVASC) 10 MG tablet Take 10 mg by mouth daily.      Marland Kitchen b complex-vitamin c-folic acid (NEPHRO-VITE) 0.8 MG TABS Take 0.8 mg by mouth daily.      . calcium acetate (PHOSLO) 667 MG capsule Take 1,334 mg by mouth 3 (three) times daily with meals.       . carvedilol (COREG) 6.25 MG tablet Take 6.25 mg by mouth 2 (two) times daily.      . cloNIDine (CATAPRES) 0.1 MG tablet Take 0.1 mg by mouth 2 (two) times daily.      Marland Kitchen esomeprazole (NEXIUM) 40 MG capsule Take 40 mg by mouth daily.      Marland Kitchen HYDROcodone-acetaminophen (NORCO) 7.5-325 MG per tablet Take 1 tablet by mouth 3 (three) times daily as needed for pain.       . ranitidine (ZANTAC) 150 MG tablet Take 150 mg by mouth 2 (two) times daily.      Marland Kitchen rOPINIRole (REQUIP) 0.5 MG tablet Take 0.5 mg by mouth daily.      Marland Kitchen zolpidem (AMBIEN) 10 MG tablet Take 10 mg by mouth at bedtime as needed for sleep.       Labs: Basic Metabolic Panel:  Recent Labs Lab 08/06/12 1312  NA 132*  K 5.8*  CL 91*  CO2 22  GLUCOSE 92  BUN 81*  CREATININE 9.45*  CALCIUM 9.7   Liver Function Tests:  Recent Labs Lab 08/06/12 1200  AST 16  ALT 29  ALKPHOS 86  BILITOT 0.3  PROT 7.6  ALBUMIN 2.9*   No results found for this basename: LIPASE, AMYLASE,  in the last 168 hours No results found for this basename: AMMONIA,  in the last 168 hours CBC:  Recent Labs Lab 08/06/12 1312  WBC 3.5*  HGB 10.3*  HCT 31.2*   MCV 88.4  PLT PLATELET CLUMPS NOTED ON SMEAR, UNABLE TO ESTIMATE   Cardiac Enzymes: No results found for this basename: CKTOTAL, CKMB, CKMBINDEX, TROPONINI,  in the last 168 hours CBG: No results found for this basename: GLUCAP,  in the last 168 hours INR: @labcntip (inr:3)@ Iron Studies: No results found for this basename: IRON, TIBC, TRANSFERRIN, FERRITIN,  in the last 72 hours Studies/Results: Ct Abdomen Pelvis Wo Contrast  08/06/2012   *RADIOLOGY REPORT*  Clinical Data: Diffuse abdominal and back pain  CT ABDOMEN AND PELVIS WITHOUT CONTRAST  Technique:  Multidetector CT imaging of the abdomen and pelvis was performed following the standard protocol without intravenous contrast.  Comparison: CT abdomen pelvis - 04/17/2008  Findings:  The lack of intravenous contrast limits the ability to evaluate solid abdominal organs.  There has been interval destruction of the T10 - T11 intervertebral disc space with associated increase sclerosis and irregularity of the adjacent endplates. There is minimal (approximately 5 mm) of herniated disc material towards the spinal canal (axial image 11, series 2) and approximately 25% loss of the T11 vertebral body height.  These findings are associated with development of adjacent paraspinal soft tissue stranding and thickening (images 8 through 13, series 2) and are worrisome for acute diskitis osteomyelitis.  Previously levels of diskitis osteomyelitis at T9 - T10 and L4 - L5 appear grossly unchanged, however there is now near complete bony fusion of the T9 - T10 intervertebral disc space.  Normal hepatic contour.  Normal noncontrast appearance of the decompressed gallbladder.  No ascites.  The bilateral kidneys are atrophic compatible with provided history of end-stage renal disease.  There is a possible 1.6 x 1.9 cm hypoattenuating lesion arising from the superior pole of the left kidney which is incompletely evaluated on this noncontrast examination (image 23, series  2).  No definite urinary obstruction. No perinephric stranding.  There is mild diffuse thickening of the left adrenal gland without discrete nodule.  Normal noncontrast appearance of the right adrenal gland, pancreas and spleen.  Ingested enteric contrast extends to the level of the distal small bowel.  No evidence of enteric obstruction.  Bowel is normal in course and caliber without discrete area of wall thickening.  No pneumoperitoneum, pneumatosis or portal venous gas.  Atherosclerotic plaque within a tortuous and normal caliber abdominal aorta.  No definite bulky retroperitoneal, mesenteric, pelvic or inguinal lymphadenopathy on this noncontrast examination.  Post hysterectomy.  No discrete adnexal lesion.  No free fluid in the pelvis.  Limited visualization of the lower thorax demonstrates worsening bibasilar opacities.  No definite pleural effusion.  Cardiomegaly.  Calcifications within the mitral valve annulus.  No definite pericardial effusion.  Multiple ventral abdominal wall metallic sutures are redemonstrated.  IMPRESSION: 1.  Findings worrisome for acute diskitis osteomyelitis involving the T10 - T11 intervertebral disc space.  Further evaluation with MRI may be performed as clinically indicated.  2.  Grossly unchanged sequela of prior presumed areas of diskitis osteomyelitis involving the T9 - T10 and L4 - L5 intervertebral disc space levels.  3.  Cardiomegaly.  4.  Indeterminate approximately 1.7 cm left-sided hypoattenuating lesion, incompletely evaluated on this noncontrast examination but may represent a renal cyst.  Further evaluation with non emergent renal ultrasound may be performed as clinically indicated.  Above findings discussed with [place], PA at 1526.   Original Report Authenticated By: Tacey Ruiz, MD   Dg Chest 2 View  08/06/2012   *RADIOLOGY REPORT*  Clinical Data: Cough.  CHEST - 2 VIEW  Comparison: 03/27/2012  Findings: The cardiopericardial silhouette is enlarged.  Vascular  congestion noted with patchy atelectasis or infiltrate bilaterally, left greater than right. Telemetry leads overlie the chest. Imaged bony structures of the thorax are intact.  IMPRESSION: No substantial change.  Cardiomegaly with vascular congestion and bilateral lower lung patchy atelectasis or infiltrate.   Original Report  Authenticated By: Kennith Center, M.D.    ROS: Dizziness, cough, back pain, leg weakness, depression. All systems negative except as above.   Physical Exam: Filed Vitals:   08/06/12 1300 08/06/12 1315 08/06/12 1403 08/06/12 1415  BP: 115/66 108/68  126/78  Pulse: 72 74  73  Temp:   98.7 F (37.1 C)   TempSrc:   Rectal   Resp: 20 23  23   Height:      Weight:      SpO2: 96% 100%  97%     General: Elderly, chronically-ill appearing, obvious discomfort. Head: Normocephalic, atraumatic, sclera non-icteric, mucus membranes are moist, dentition in poor repair upper plate and protruding lower teeth, fundi with scarring  Neck: Supple. JVD mildly elevated.PCL Lungs: Decreased at bases, No wheezes, rales, or rhonchi. Breathing is unlabored. Heart: RRR with S1 S2. No murmurs, rubs, or gallops appreciated.  Gr 2/6 SEM ULSB  PMI 12 cm lat to MSL Abdomen: Soft,  Diffusely tender, normoactive bowel sounds. No rebound/guarding. No obvious abdominal masses. M-S:  Strength and tone appear normal for age. Lower extremities:without edema or ischemic changes, no open wounds Pulses 2+/4+ Neuro: Alert and oriented X 3. Moves all extremities spontaneously. LE effort poor but moves Psych:  Tearful, Responds to questions appropriately Dialysis Access: Large LUA AVF + bruit  Dialysis Orders: Center: [facility]  on TTS. EDW 60.5 HD Bath 2K/2Ca  Time 3:30 Heparin NONE. Access LUA AVF BFR 400 DFR 600   Hectorol 4 mcg IV/HD Epogen 1800 Units IV/HD  Venofer  50 mg q week  Labs: Hgb 11.9, Tsat 36%, Ferritin 764 in April, Phos 8.4, PTH 959.6, Ca 9  Assessment/Plan: 1. Acute diskitis osteomyelitis  T10-11 - Per admit. Neurosurgery consulted. Plan for abx upon completion of CT-guided biopsy. 2. ESRD/Hyperkalemia -  TTS but only one tx Wed of this week . K+5.8, should resolve with HD. Hold kayexalate.  Recurrently s/o, and poor compliance with meds and HD  3. Hypertension/volume  - SBPs 100s-120s. Home amlodipine, coreg and clonidine held for now. Up about 1 kg by wgts with vascular congestion on CXR. UF 2.5L as tolerated. Suspect if would dialzyze appropriately would not need as much 4.  Anemia will use epo, check Fe 5. Metabolic bone disease -  Ca 9.7 (10.6 corrected). ^Phos and PTH op. No binders for 3 weeks sec to cost. Continue hectorol, phoslo and low Ca bath 6. Nutrition - Alb 2.9. NPO for now. Renal diet when appropriate. Multivitamin, 7. HX CVA - left side deficit 8. Carpal Tunnel syndrome - Has had op eval and been approved for surgery. Procedure cancelled due to cost.  Claud Kelp, PA-C [facility] Pager [phone] 08/06/2012, 4:48 PM  I have seen and examined this patient and agree with the plan of care seen, eval , examined and counseled.  Eval disciitis and give AB.  Will do HD and lower K and vol. .  Girtha Kilgore L 08/06/2012, 5:33 PM

## 2012-08-07 ENCOUNTER — Encounter (HOSPITAL_COMMUNITY): Payer: Self-pay | Admitting: Radiology

## 2012-08-07 ENCOUNTER — Inpatient Hospital Stay (HOSPITAL_COMMUNITY): Payer: Medicare Other

## 2012-08-07 LAB — COMPREHENSIVE METABOLIC PANEL
ALT: 23 U/L (ref 0–35)
AST: 13 U/L (ref 0–37)
Albumin: 2.9 g/dL — ABNORMAL LOW (ref 3.5–5.2)
CO2: 28 mEq/L (ref 19–32)
Chloride: 99 mEq/L (ref 96–112)
GFR calc non Af Amer: 9 mL/min — ABNORMAL LOW (ref >90)
Potassium: 4 mEq/L (ref 3.5–5.1)
Sodium: 140 mEq/L (ref 135–145)
Total Bilirubin: 0.3 mg/dL (ref 0.3–1.2)

## 2012-08-07 LAB — CBC
MCH: 29.4 pg (ref 26.0–34.0)
MCHC: 32.8 g/dL (ref 30.0–36.0)
MCV: 89.7 fL (ref 78.0–100.0)
Platelets: 189 10*3/uL (ref 150–400)

## 2012-08-07 LAB — PROTIME-INR: INR: 1.14 (ref 0.00–1.49)

## 2012-08-07 MED ORDER — MIDAZOLAM HCL 2 MG/2ML IJ SOLN
INTRAMUSCULAR | Status: AC
  Filled 2012-08-07: qty 4

## 2012-08-07 MED ORDER — ROPINIROLE HCL 0.5 MG PO TABS
0.5000 mg | ORAL_TABLET | Freq: Every day | ORAL | Status: DC
  Administered 2012-08-07 – 2012-08-11 (×5): 0.5 mg via ORAL
  Filled 2012-08-07 (×6): qty 1

## 2012-08-07 MED ORDER — DEXTROSE 5 % IV SOLN
2.0000 g | Freq: Once | INTRAVENOUS | Status: DC
  Filled 2012-08-07: qty 2

## 2012-08-07 MED ORDER — FENTANYL CITRATE 0.05 MG/ML IJ SOLN
INTRAMUSCULAR | Status: AC | PRN
  Administered 2012-08-07: 50 ug via INTRAVENOUS
  Administered 2012-08-07 (×2): 25 ug via INTRAVENOUS

## 2012-08-07 MED ORDER — PANTOPRAZOLE SODIUM 40 MG PO TBEC
80.0000 mg | DELAYED_RELEASE_TABLET | Freq: Every day | ORAL | Status: DC
  Administered 2012-08-07 – 2012-08-11 (×5): 80 mg via ORAL
  Filled 2012-08-07 (×4): qty 2

## 2012-08-07 MED ORDER — CAPSAICIN 0.025 % EX CREA
TOPICAL_CREAM | Freq: Two times a day (BID) | CUTANEOUS | Status: DC
  Administered 2012-08-07 – 2012-08-10 (×7): via TOPICAL
  Filled 2012-08-07: qty 56.6

## 2012-08-07 MED ORDER — DEXTROSE 5 % IV SOLN
2.0000 g | INTRAVENOUS | Status: AC
  Administered 2012-08-07: 2 g via INTRAVENOUS
  Filled 2012-08-07: qty 2

## 2012-08-07 MED ORDER — MIDAZOLAM HCL 2 MG/2ML IJ SOLN
INTRAMUSCULAR | Status: AC | PRN
  Administered 2012-08-07: 1 mg via INTRAVENOUS
  Administered 2012-08-07: 0.5 mg via INTRAVENOUS
  Administered 2012-08-07: 1 mg via INTRAVENOUS

## 2012-08-07 MED ORDER — VANCOMYCIN HCL 500 MG IV SOLR
500.0000 mg | Freq: Once | INTRAVENOUS | Status: AC
  Administered 2012-08-09: 500 mg via INTRAVENOUS
  Filled 2012-08-07 (×2): qty 500

## 2012-08-07 MED ORDER — FENTANYL CITRATE 0.05 MG/ML IJ SOLN
INTRAMUSCULAR | Status: AC
  Filled 2012-08-07: qty 4

## 2012-08-07 MED ORDER — SODIUM CHLORIDE 0.9 % IV SOLN: 125.0000 mg | INTRAVENOUS | Status: DC

## 2012-08-07 MED ORDER — VANCOMYCIN HCL 10 G IV SOLR
1250.0000 mg | Freq: Once | INTRAVENOUS | Status: AC
  Administered 2012-08-07: 1250 mg via INTRAVENOUS
  Filled 2012-08-07: qty 1250

## 2012-08-07 MED ORDER — BIOTENE DRY MOUTH MT LIQD: 15.0000 mL | OROMUCOSAL | Status: DC | PRN

## 2012-08-07 NOTE — Procedures (Signed)
Interventional Radiology Procedure Note  Procedure: Technically successful CT guided T10-T11 disc aspiration.  Minimal ~ 1 mL bloody fluid aspirated and diluted in saline. Sent for Cx. Complications: None. Recommendations: - Bedrest x 1 hour - Follow Cx  Signed,  Sterling Big, MD Vascular & Interventional Radiologist [facility]

## 2012-08-07 NOTE — H&P (Signed)
Agree with PA note.  Will attempt aspiration of T10-T11 disc space and/or paraspinal phlegmon.  Very low pre-test probability of isolating a pathogen.  Signed,  Sterling Big, MD [facility] Interventional [facility]iology [facility]

## 2012-08-07 NOTE — Progress Notes (Signed)
2.5mg  total Versed was given to pt during her CT aspiration procedure. Waste in Phyxis was entered incorrectly. Chelsea from CT wasted with RN, Rodney Booze. Meant to say that 2.5mg  given and 1.5mg  wasted. Accidentally charted 2.25mg  given and 1.75mg  wasted.

## 2012-08-07 NOTE — Consult Note (Signed)
ANTIBIOTIC CONSULT NOTE - INITIAL  Pharmacy Consult for :   Vancomycin, Cefepime Indication:  Diskitis osteomyelitis T10-11  Hospital Problems: Active Problems:   ESRD (end stage renal disease) on dialysis   Osteomyelitis   Diskitis   HTN (hypertension)   GERD (gastroesophageal reflux disease)  Allergies: No Known Allergies  Patient Measurements: Height: 4\' 10"  (147.3 cm) Weight: 125 lb 10.6 oz (57 kg) IBW/kg (Calculated) : 40.9  Vital Signs: BP 123/87  Pulse 78  Temp(Src) 98.6 F (37 C) (Oral)  Resp 18  Ht 4\' 10"  (1.473 m)  Wt 125 lb 10.6 oz (57 kg)  BMI 26.27 kg/m2  SpO2 100%  Labs:  Recent Labs  08/06/12 2004 08/07/12 0400  WBC  --  3.4*  HGB  --  10.3*  PLT  --  189  CREATININE 9.61* 4.22*   Estimated Creatinine Clearance: 8.6 ml/min (by C-G formula based on Cr of 4.22).   Microbiology: Recent Results (from the past 720 hour(s))  MRSA PCR SCREENING     Status: None   Collection Time    08/07/12  3:35 AM      Result Value Range Status   MRSA by PCR NEGATIVE  NEGATIVE Final    Medical/Surgical History: Diagnosis  . Renal disorder  . Hypertension  . GERD (gastroesophageal reflux disease)  . Arthritis  . Renal failure  . CVA (cerebral infarction)     . Carpal tunnel syndrome  . Secondary hyperparathyroidism (of renal origin)   Procedure Laterality  . Ankle reconstruction   . Cesarean section      . Sp av dialysis shunt access existing *l* Left  . Abdominal hysterectomy    Medications:  Prescriptions prior to admission  Medication Sig Dispense Refill  . amLODipine (NORVASC) 10 MG tablet Take 10 mg by mouth daily.      Marland Kitchen b complex-vitamin c-folic acid (NEPHRO-VITE) 0.8 MG TABS Take 0.8 mg by mouth daily.      . calcium acetate (PHOSLO) 667 MG capsule Take 1,334 mg by mouth 3 (three) times daily with meals.       . carvedilol (COREG) 6.25 MG tablet Take 6.25 mg by mouth 2 (two) times daily.      . cloNIDine (CATAPRES) 0.1 MG tablet Take 0.1  mg by mouth 2 (two) times daily.      Marland Kitchen esomeprazole (NEXIUM) 40 MG capsule Take 40 mg by mouth daily.      Marland Kitchen HYDROcodone-acetaminophen (NORCO) 7.5-325 MG per tablet Take 1 tablet by mouth 3 (three) times daily as needed for pain.       . ranitidine (ZANTAC) 150 MG tablet Take 150 mg by mouth 2 (two) times daily.      Marland Kitchen rOPINIRole (REQUIP) 0.5 MG tablet Take 0.5 mg by mouth daily.      Marland Kitchen zolpidem (AMBIEN) 10 MG tablet Take 10 mg by mouth at bedtime as needed for sleep.       Scheduled:  . calcium acetate  2,001 mg Oral TID WC  . capsaicin   Topical BID  . darbepoetin (ARANESP) injection - DIALYSIS  60 mcg Intravenous Q Fri-HD  . [START ON 08/12/2012] ferric gluconate (FERRLECIT/NULECIT) IV  125 mg Intravenous Q Thu-HD  . heparin  5,000 Units Subcutaneous Q8H  .  HYDROmorphone (DILAUDID) injection  1 mg Intravenous Once  . multivitamin  1 tablet Oral Q supper  . pantoprazole  80 mg Oral Q1200  . rOPINIRole  0.5 mg Oral Daily  . sodium chloride  3 mL Intravenous Q12H   Assessment:  77 y/o F with history of ESRD admitted w/Diskitis osteomyelitis T10-11 and will be placed on Cefepime and Vancomycin.  Currently on HD q TTS [patient non-compliant].  Next HD session scheduled Monday, 08/09/12.  Goal of Therapy:  Pre - HD Vancomycin levels 15 - 25 mcg/ml  Antibiotics selected for infection/cultures and adjusted for renal function/dialysis schedule.  Plan:   Vancomycin 1250 mg IV x 1, then 500 mg q HD.  Cefepime 2 gm IV now, then 2 gm IV q HD. Follow up SCr, UOP, cultures, clinical course and adjust as clinically indicated.  Kimbely Whiteaker, Elisha Headland,  Pharm.D.,    7/19/20144:05 PM

## 2012-08-07 NOTE — Progress Notes (Signed)
[facility] PROGRESS NOTE  Khaliyah Northrop ZOX:[id number] DOB: 1935-02-10 DOA: 08/06/2012 PCP: No primary provider on file.  Assessment/Plan  Osteomyelitis/diskitis T10-11  -  Appreciate radiology assistance with bone biopsy -  Obtain blood cultures -  ESR and CRP -  Start vancomycin and cefepime after blood cultures obtained  Left kidney lesion, possible cyst -  RUS  End-stage renal disease on hemodialysis Tuesday, Thursday, Saturday. -  Next hemodialysis on Monday -  Patient has history of noncompliance, we'll have social worker find out if there is a problem with her returning to her previous dialysis center  Hyperkalemia resolved with hemodialysis   HTN blood pressures have been low normal - Continue to hold blood pressure medications  Anemia of renal parenchymal disease - Per nephrology  GERD stable, continue high dose PPI  Hx of CVA with residual side weakness and facial droop  Carpal tunnel and chronic pain  -  Consider fentanyl patch or gabapentin - will discuss with patient -  Continue vicodin prn  Progressive weakness - somewhat acute on chronic, uses a walker at home  - PT consult.    Diet:  Renal diet Access:  PIV IVF:  OFF Proph:  heparin  Code Status: Full Family Communication: spoke with the patient alone Disposition Plan: pending results of culture from disk and blood cultures   Consultants:  Nephrology  Radiology  Procedures:  CT-guided biopsy on 7/18   Antibiotics:  Vancomycin 7/19 >>  Cefepime 7/19 >>   HPI/Subjective:  Endorses severe hand and low back pain.  Pain is primarily in the low lumbar region and radiates laterally.  Feels hungry but also has nausea.  Objective: Filed Vitals:   08/07/12 1036 08/07/12 1102 08/07/12 1206 08/07/12 1231  BP: 112/73 116/72 135/93 123/87  Pulse:  78 79 78  Temp:  98.5 F (36.9 C) 98.7 F (37.1 C) 98.6 F (37 C)  TempSrc:  Oral Oral Oral  Resp:  18 18 18   Height:      Weight:       SpO2: 100% 100% 100% 100%    Intake/Output Summary (Last 24 hours) at 08/07/12 1442 Last data filed at 08/06/12 2339  Gross per 24 hour  Intake      0 ml  Output   2715 ml  Net  -2715 ml   Filed Weights   08/06/12 1202 08/06/12 1915 08/07/12 0025  Weight: 61.236 kg (135 lb) 58.6 kg (129 lb 3 oz) 57 kg (125 lb 10.6 oz)    Exam:   General:  Thin African American female,  No acute distress  HEENT:  NCAT, MMM  Cardiovascular:  RRR, nl S1, S2 radiation of bruit from arm, 2+ pulses, warm extremities  Respiratory:  CTAB, no increased WOB  Abdomen:  NABS, soft, NT/ND  MSK:  Normal tone and bulk, no LEE.  Patient has no tenderness to palpation of the spinous processes. She does have some tenderness of the paraspinous muscles of the L5 level and tenderness over the SI joints bilaterally.  She has severe pain in her bilateral hands particularly in her right hand and yelled in pain when I try to shake her hand.    Neuro:  Grossly intact  mild facial asymmetry, 3/5 hand grip on the right, possibly secondary to pain.  Data Reviewed: Basic Metabolic Panel:  Recent Labs Lab 08/06/12 1312 08/06/12 2004 08/07/12 0400  NA 132* 131* 140  K 5.8* 5.7* 4.0  CL 91* 90* 99  CO2 22 23 28  GLUCOSE 92 79 68*  BUN 81* 84* 23  CREATININE 9.45* 9.61* 4.22*  CALCIUM 9.7 9.3 9.4  PHOS  --  8.3*  --    Liver Function Tests:  Recent Labs Lab 08/06/12 1200 08/06/12 2004 08/07/12 0400  AST 16  --  13  ALT 29  --  23  ALKPHOS 86  --  81  BILITOT 0.3  --  0.3  PROT 7.6  --  7.2  ALBUMIN 2.9* 2.8* 2.9*   No results found for this basename: LIPASE, AMYLASE,  in the last 168 hours No results found for this basename: AMMONIA,  in the last 168 hours CBC:  Recent Labs Lab 08/06/12 1312 08/07/12 0400  WBC 3.5* 3.4*  HGB 10.3* 10.3*  HCT 31.2* 31.4*  MCV 88.4 89.7  PLT PLATELET CLUMPS NOTED ON SMEAR, UNABLE TO ESTIMATE 189   Cardiac Enzymes: No results found for this basename:  CKTOTAL, CKMB, CKMBINDEX, TROPONINI,  in the last 168 hours BNP (last 3 results) No results found for this basename: PROBNP,  in the last 8760 hours CBG: No results found for this basename: GLUCAP,  in the last 168 hours  Recent Results (from the past 240 hour(s))  MRSA PCR SCREENING     Status: None   Collection Time    08/07/12  3:35 AM      Result Value Range Status   MRSA by PCR NEGATIVE  NEGATIVE Final   Comment:            The GeneXpert MRSA Assay (FDA     approved for NASAL specimens     only), is one component of a     comprehensive MRSA colonization     surveillance program. It is not     intended to diagnose MRSA     infection nor to guide or     monitor treatment for     MRSA infections.     Studies: Ct Abdomen Pelvis Wo Contrast  08/06/2012   *RADIOLOGY REPORT*  Clinical Data: Diffuse abdominal and back pain  CT ABDOMEN AND PELVIS WITHOUT CONTRAST  Technique:  Multidetector CT imaging of the abdomen and pelvis was performed following the standard protocol without intravenous contrast.  Comparison: CT abdomen pelvis - 04/17/2008  Findings:  The lack of intravenous contrast limits the ability to evaluate solid abdominal organs.  There has been interval destruction of the T10 - T11 intervertebral disc space with associated increase sclerosis and irregularity of the adjacent endplates. There is minimal (approximately 5 mm) of herniated disc material towards the spinal canal (axial image 11, series 2) and approximately 25% loss of the T11 vertebral body height.  These findings are associated with development of adjacent paraspinal soft tissue stranding and thickening (images 8 through 13, series 2) and are worrisome for acute diskitis osteomyelitis.  Previously levels of diskitis osteomyelitis at T9 - T10 and L4 - L5 appear grossly unchanged, however there is now near complete bony fusion of the T9 - T10 intervertebral disc space.  Normal hepatic contour.  Normal noncontrast  appearance of the decompressed gallbladder.  No ascites.  The bilateral kidneys are atrophic compatible with provided history of end-stage renal disease.  There is a possible 1.6 x 1.9 cm hypoattenuating lesion arising from the superior pole of the left kidney which is incompletely evaluated on this noncontrast examination (image 23, series 2).  No definite urinary obstruction. No perinephric stranding.  There is mild diffuse thickening of the left adrenal gland without  discrete nodule.  Normal noncontrast appearance of the right adrenal gland, pancreas and spleen.  Ingested enteric contrast extends to the level of the distal small bowel.  No evidence of enteric obstruction.  Bowel is normal in course and caliber without discrete area of wall thickening.  No pneumoperitoneum, pneumatosis or portal venous gas.  Atherosclerotic plaque within a tortuous and normal caliber abdominal aorta.  No definite bulky retroperitoneal, mesenteric, pelvic or inguinal lymphadenopathy on this noncontrast examination.  Post hysterectomy.  No discrete adnexal lesion.  No free fluid in the pelvis.  Limited visualization of the lower thorax demonstrates worsening bibasilar opacities.  No definite pleural effusion.  Cardiomegaly.  Calcifications within the mitral valve annulus.  No definite pericardial effusion.  Multiple ventral abdominal wall metallic sutures are redemonstrated.  IMPRESSION: 1.  Findings worrisome for acute diskitis osteomyelitis involving the T10 - T11 intervertebral disc space.  Further evaluation with MRI may be performed as clinically indicated.  2.  Grossly unchanged sequela of prior presumed areas of diskitis osteomyelitis involving the T9 - T10 and L4 - L5 intervertebral disc space levels.  3.  Cardiomegaly.  4.  Indeterminate approximately 1.7 cm left-sided hypoattenuating lesion, incompletely evaluated on this noncontrast examination but may represent a renal cyst.  Further evaluation with non emergent renal  ultrasound may be performed as clinically indicated.  Above findings discussed with Geiple, PA at 1526.   Original Report Authenticated By: Tacey Ruiz, MD   Dg Chest 2 View  08/06/2012   *RADIOLOGY REPORT*  Clinical Data: Cough.  CHEST - 2 VIEW  Comparison: 03/27/2012  Findings: The cardiopericardial silhouette is enlarged.  Vascular congestion noted with patchy atelectasis or infiltrate bilaterally, left greater than right. Telemetry leads overlie the chest. Imaged bony structures of the thorax are intact.  IMPRESSION: No substantial change.  Cardiomegaly with vascular congestion and bilateral lower lung patchy atelectasis or infiltrate.   Original Report Authenticated By: Kennith Center, M.D.   Ir Lumbar Disc Aspiration W/img Guide  08/07/2012   *RADIOLOGY REPORT*  CT ASPIRATION  Date: 08/07/2012  Clinical History: 77 year old female with osteomyelitis diskitis of the T10-T11 interspace.  CT guided aspiration is requested to facilitate passage of identification prior to initiation of intravenous antibiotics.  Procedures Performed: 1. CT guided aspiration of the T10-T11 disc space  Interventional Radiologist:  Sterling Big, MD  Sedation: Moderate (conscious) sedation was used.  2.5 mg Versed, 100 mcg Fentanyl were administered intravenously.  The patient's vital signs were monitored continuously by radiology nursing throughout the procedure.  Sedation Time: 30 minutes  Fluoroscopy time: 3 minutes  Contrast volume: None  PROCEDURE/FINDINGS:   Informed consent was obtained from the patient following explanation of the procedure, risks, benefits and alternatives. The patient understands, agrees and consents for the procedure. All questions were addressed. A time out was performed.  Maximal barrier sterile technique utilized including caps, mask, sterile gowns, sterile gloves, large sterile drape, hand hygiene, and betadine skin prep.  A planning axial CT scan was performed.  The T10-T11 disc space was  successfully identified.  A suitable skin entry site was selected and marked.  Using snap shot CT guidance, an 18 gauge trocar needle was carefully advanced from an oblique approach into the T10-T11 disc space.  Aspiration and agitation of the disc space new approximately 1 ml of bloody fluid.  This was dilated and a small amount of saline within the aspiration syringe.  The needle was removed.  Post aspiration axial CT imaging demonstrates no  evidence of immediate complication or hemorrhage.  The patient tolerated the procedure well and was returned to her room in stable condition.  IMPRESSION:  Technically successful CT-guided aspiration of the T10-T11 disc space yielding approximately 1 ml of bloody fluid which is dilated and a small amount of saline and sent to the lab for culture and sensitivities.  Signed,  Sterling Big, MD Vascular & Interventional Radiologist [facility]   Original Report Authenticated By: Malachy Moan, M.D.    Scheduled Meds: . calcium acetate  2,001 mg Oral TID WC  . darbepoetin (ARANESP) injection - DIALYSIS  60 mcg Intravenous Q Fri-HD  . [START ON 08/12/2012] ferric gluconate (FERRLECIT/NULECIT) IV  125 mg Intravenous Q Thu-HD  . heparin  5,000 Units Subcutaneous Q8H  .  HYDROmorphone (DILAUDID) injection  1 mg Intravenous Once  . multivitamin  1 tablet Oral Q supper  . pantoprazole  80 mg Oral Q1200  . rOPINIRole  0.5 mg Oral Daily  . sodium chloride  3 mL Intravenous Q12H   Continuous Infusions:   Active Problems:   ESRD (end stage renal disease) on dialysis   Osteomyelitis   Diskitis   HTN (hypertension)   GERD (gastroesophageal reflux disease)    Time spent: 30 min    Melesio Madara, [facility]  [facility] Pager [phone]. If 7PM-7AM, please contact night-coverage at [email or web address], password [facility] 08/07/2012, 2:42 PM  LOS: 1 day

## 2012-08-07 NOTE — Progress Notes (Signed)
[facility] Progress Note  Subjective:   C/o pain in back; wants to sit up; can't get comfortable - lying crossways in bed and half sitting up. Said she had prior back infection about 10 years ago in [place] (has been on HD ~14 years)  Objective Filed Vitals:   08/07/12 1030 08/07/12 1033 08/07/12 1036 08/07/12 1102  BP: 118/72 118/72 112/73 116/72  Pulse: 81 78  78  Temp:    98.5 F (36.9 C)  TempSrc:    Oral  Resp: 16 18  18   Height:      Weight:      SpO2: 100% 100% 100% 100%   Physical Exam General: uncomfortable, multiple complaints Heart: RRR 2/6 murmur Lungs: grossly clear without rales or wheezes diminished bs Abdomen:soft mild diffuse tenderness Extremities: no edema Dialysis Access: left upper AVF + bruit and thrill  Dialysis Orders: Center: [facility] on TTS.  EDW 60.5 HD Bath 2K/2Ca Time 3:30 Heparin NONE. Access LUA AVF BFR 400 DFR 600  Hectorol 4 mcg IV/HD Epogen 1800 Units IV/HD Venofer 50 mg q week  Labs: Hgb 11.9, Tsat 36%, Ferritin 764 in April, Phos 8.4, PTH 959.6, Ca 9   Assessment/Plan: 1. Acute diskitis osteomyelitis T10-11 -  Neurosurgery consulted. Plan for abx upon completion of CT-guided biopsy just completed . (note CT shows grossly unchanged sequela of prior presumed areas of diskitis osteomyelitis involving the T9 - T10 and L4 - L5 intervertebral disc space levels)= what pat said she had about 10 years ago while living in [place] 2. ESRD/Hyperkalemia - TTS hx noncompliance with HD; K corrected with dialysis (4 today) - had HD on off day - plan next HD Monday; no on heparin HD 3. Hypertension/volume - SBPs variable - . Home amlodipine, coreg and clonidine held for now. Most recent BP in the 90s . Net UF 2.7L Friday - looks like post HD weight about 58.6 - probably can use less meds with lower volume 4. Anemia -Hgb stable 10.3  - Aranesp 60, weeklyl low dose IV Fe 5. Metabolic bone disease - . ^Phos and PTH op. No binders for 3 weeks sec to  cost. Continue hectorol, phoslo and low Ca bath 6. Nutrition - Alb 2.9. NPO for now. Renal diet when appropriate. Multivitamin, 7. HX CVA - left side deficit 8. Carpal Tunnel syndrome - Has had op eval and been approved for surgery. Procedure cancelled due to cost. 9. Hx dialysis and medical noncompliance, the latter ostensibly secondary to financial constraints  Sheffield Slider, PA-C [facility] Beeper [phone] 08/07/2012,11:21 AM  LOS: 1 day  I have seen and examined this patient and agree with the plan of care seen, eval, examined, and counseled. .  Emmett Bracknell L 08/07/2012, 1:36 PM    Additional Objective Labs: Basic Metabolic Panel:  Recent Labs Lab 08/06/12 1312 08/06/12 2004 08/07/12 0400  NA 132* 131* 140  K 5.8* 5.7* 4.0  CL 91* 90* 99  CO2 22 23 28   GLUCOSE 92 79 68*  BUN 81* 84* 23  CREATININE 9.45* 9.61* 4.22*  CALCIUM 9.7 9.3 9.4  PHOS  --  8.3*  --    Liver Function Tests:  Recent Labs Lab 08/06/12 1200 08/06/12 2004 08/07/12 0400  AST 16  --  13  ALT 29  --  23  ALKPHOS 86  --  81  BILITOT 0.3  --  0.3  PROT 7.6  --  7.2  ALBUMIN 2.9* 2.8* 2.9*   CBC:  Recent Labs Lab 08/06/12 1312 08/07/12 0400  WBC 3.5* 3.4*  HGB 10.3* 10.3*  HCT 31.2* 31.4*  MCV 88.4 89.7  PLT PLATELET CLUMPS NOTED ON SMEAR, UNABLE TO ESTIMATE 189   Blood Culture No results found for this basename: sdes,  specrequest,  cult,  reptstatus   Studies/Results: Ct Abdomen Pelvis Wo Contrast  08/06/2012   *RADIOLOGY REPORT*  Clinical Data: Diffuse abdominal and back pain  CT ABDOMEN AND PELVIS WITHOUT CONTRAST  .  IMPRESSION: 1.  Findings worrisome for acute diskitis osteomyelitis involving the T10 - T11 intervertebral disc space.  Further evaluation with MRI may be performed as clinically indicated.  2.  Grossly unchanged sequela of prior presumed areas of diskitis osteomyelitis involving the T9 - T10 and L4 - L5 intervertebral disc space levels.  3.   Cardiomegaly.  4.  Indeterminate approximately 1.7 cm left-sided hypoattenuating lesion, incompletely evaluated on this noncontrast examination but may represent a renal cyst.  Further evaluation with non emergent renal ultrasound may be performed as clinically indicated.  Above findings discussed with Geiple, PA at 1526.   Original Report Authenticated By: Tacey Ruiz, MD   Dg Chest 2 View  08/06/2012   *RADIOLOGY REPORT*    IMPRESSION: No substantial change.  Cardiomegaly with vascular congestion and bilateral lower lung patchy atelectasis or infiltrate.   Original Report Authenticated By: Kennith Center, M.D.   Medications:   . calcium acetate  2,001 mg Oral TID WC  . darbepoetin (ARANESP) injection - DIALYSIS  60 mcg Intravenous Q Fri-HD  . [START ON 08/12/2012] ferric gluconate (FERRLECIT/NULECIT) IV  125 mg Intravenous Q Thu-HD  . heparin  5,000 Units Subcutaneous Q8H  .  HYDROmorphone (DILAUDID) injection  1 mg Intravenous Once  . multivitamin  1 tablet Oral Q supper  . pantoprazole  80 mg Oral Q1200  . rOPINIRole  0.5 mg Oral Daily  . sodium chloride  3 mL Intravenous Q12H

## 2012-08-07 NOTE — Evaluation (Signed)
Physical Therapy Evaluation Patient Details Name: Lilyanna Lunt MRN: [id number] DOB: [birth year] Today's Date: 08/07/2012 Time: 4098-1191 PT Time Calculation (min): 16 min  PT Assessment / Plan / Recommendation History of Present Illness  77 y.o. female has a past medical history significant for end-stage renal disease on hemodialysis TTS, with her last hemodialysis Saturday, also history of hypertension, previous CVA, GERD presents with a chief complaint of back pain and weakness for the past 5 days. Her complaints are somewhat diffuse, however endorses nausea, generalized weakness with difficulties walking and fatigue. She missed her dialysis sessions this week due to these complaints. She lives in [place].  CT scan which showed findings worrisome for acute discitis/osteomyelitis of the T10-11 region and now s/p CT guided T10-T11 disc aspiration  Clinical Impression  Pt currently with functional limitations due to the deficits listed below (see PT Problem List). Pt will benefit from skilled PT to increase their independence and safety with mobility to allow discharge to the venue listed below. Pt hopeful to d/c home however requiring assist at this time and would benefit from ST-SNF.  Pt may progress to home.  Pt also reports bilateral wrist/hand pain from arthritis is very limiting to her mobility at this time so notified RN of pt request for pain creme.      PT Assessment  Patient needs continued PT services    Follow Up Recommendations  Supervision/Assistance - 24 hour;SNF    Does the patient have the potential to tolerate intense rehabilitation      Barriers to Discharge        Equipment Recommendations  3in1 (PT)    Recommendations for Other Services     Frequency Min 3X/week    Precautions / Restrictions Precautions Precautions: Fall   Pertinent Vitals/Pain unrated bilateral wrist/hand pain > back pain today, notified RN, repositioned pt to comfort      Mobility  Bed  Mobility Bed Mobility: Supine to Sit;Sit to Supine Supine to Sit: 3: Mod assist Sit to Supine: 3: Mod assist Details for Bed Mobility Assistance: pt reports bed not working so assist for trunk upright, also needed assist for LEs onto bed Transfers Transfers: Sit to Stand;Stand to Sit Sit to Stand: 4: Min assist;With upper extremity assist;From bed Stand to Sit: 4: Min guard;With upper extremity assist;To bed Details for Transfer Assistance: verbal cues for technique, assist to rise Ambulation/Gait Ambulation/Gait Assistance: 4: Min guard Ambulation Distance (Feet): 28 Feet Assistive device: Rolling walker Ambulation/Gait Assistance Details: pt prefers rollator, reports increased bil wrist/hand pain from arthritis Gait Pattern: Step-through pattern;Trunk flexed;Decreased stride length Gait velocity: decreased    Exercises     PT Diagnosis: Difficulty walking  PT Problem List: Decreased strength;Decreased activity tolerance;Decreased mobility;Pain PT Treatment Interventions: DME instruction;Gait training;Functional mobility training;Therapeutic activities;Therapeutic exercise;Patient/family education     PT Goals(Current goals can be found in the care plan section) Acute Rehab PT Goals PT Goal Formulation: With patient Time For Goal Achievement: 08/14/12 Potential to Achieve Goals: Good  Visit Information  Last PT Received On: 08/07/12 Assistance Needed: +1 History of Present Illness: 77 y.o. female has a past medical history significant for end-stage renal disease on hemodialysis TTS, with her last hemodialysis Saturday, also history of hypertension, previous CVA, GERD presents with a chief complaint of back pain and weakness for the past 5 days. Her complaints are somewhat diffuse, however endorses nausea, generalized weakness with difficulties walking and fatigue. She missed her dialysis sessions this week due to these complaints. She lives in [place].  CT scan which showed  findings worrisome for acute discitis/osteomyelitis of the T10-11 region and now s/p CT guided T10-T11 disc aspiration       Prior Functioning  Home Living Family/patient expects to be discharged to:: Private residence Living Arrangements: Alone Type of Home: House Home Access: Level entry Home Layout: One level Home Equipment: Environmental consultant - 4 wheels;Other (comment) (rollator) Prior Function Level of Independence: Independent with assistive device(s) Communication Communication: No difficulties    Cognition  Cognition Arousal/Alertness: Awake/alert Behavior During Therapy: WFL for tasks assessed/performed Overall Cognitive Status: Within Functional Limits for tasks assessed    Extremity/Trunk Assessment Lower Extremity Assessment Lower Extremity Assessment: RLE deficits/detail;LLE deficits/detail RLE Deficits / Details: decreased hip flexion strength observed, pt reports overall LE weakness throughout, not at baseline LLE Deficits / Details: decreased hip flexion strength observed, pt reports overall LE weakness throughout, not at baseline   Balance    End of Session PT - End of Session Activity Tolerance: Patient limited by fatigue;Patient limited by pain Patient left: in bed;with call bell/phone within reach Nurse Communication: Mobility status;Other (comment) (request for pain cream for wrists/hands)  GP     Ada Holness,KATHrine E 08/07/2012, 3:51 PM Zenovia Jarred, PT, DPT 08/07/2012 Pager: [phone]

## 2012-08-07 NOTE — H&P (Signed)
Julia Small is an 77 y.o. female.   Chief Complaint: Back pain x 1 week Weakness; fatigue No injury; no chills; no fever CT reveals Thoracic 10-11 diskitis Scheduled for disc aspiration now  HPI: ESRD; HTN; CVA; GERD  Past Medical History  Diagnosis Date  . Renal disorder   . Hypertension   . GERD (gastroesophageal reflux disease)   . Arthritis   . Renal failure   . CVA (cerebral infarction)     Left side weakness and facial droop  . Carpal tunnel syndrome     Past Surgical History  Procedure Laterality Date  . Ankle reconstruction    . Cesarean section      three  . Sp av dialysis shunt access existing *l* Left   . Abdominal hysterectomy      History reviewed. No pertinent family history. Social History:  reports that she has quit smoking. She does not have any smokeless tobacco history on file. She reports that  drinks alcohol. She reports that she does not use illicit drugs.  Allergies: No Known Allergies  Medications Prior to Admission  Medication Sig Dispense Refill  . amLODipine (NORVASC) 10 MG tablet Take 10 mg by mouth daily.      Marland Kitchen b complex-vitamin c-folic acid (NEPHRO-VITE) 0.8 MG TABS Take 0.8 mg by mouth daily.      . calcium acetate (PHOSLO) 667 MG capsule Take 1,334 mg by mouth 3 (three) times daily with meals.       . carvedilol (COREG) 6.25 MG tablet Take 6.25 mg by mouth 2 (two) times daily.      . cloNIDine (CATAPRES) 0.1 MG tablet Take 0.1 mg by mouth 2 (two) times daily.      Marland Kitchen esomeprazole (NEXIUM) 40 MG capsule Take 40 mg by mouth daily.      Marland Kitchen HYDROcodone-acetaminophen (NORCO) 7.5-325 MG per tablet Take 1 tablet by mouth 3 (three) times daily as needed for pain.       . ranitidine (ZANTAC) 150 MG tablet Take 150 mg by mouth 2 (two) times daily.      Marland Kitchen rOPINIRole (REQUIP) 0.5 MG tablet Take 0.5 mg by mouth daily.      Marland Kitchen zolpidem (AMBIEN) 10 MG tablet Take 10 mg by mouth at bedtime as needed for sleep.        Results for orders placed during  the hospital encounter of 08/06/12 (from the past 48 hour(s))  HEPATIC FUNCTION PANEL     Status: Abnormal   Collection Time    08/06/12 12:00 PM      Result Value Range   Total Protein 7.6  6.0 - 8.3 g/dL   Albumin 2.9 (*) 3.5 - 5.2 g/dL   AST 16  0 - 37 U/L   ALT 29  0 - 35 U/L   Alkaline Phosphatase 86  39 - 117 U/L   Total Bilirubin 0.3  0.3 - 1.2 mg/dL   Bilirubin, Direct 0.1  0.0 - 0.3 mg/dL   Comment: HEMOLYSIS AT THIS LEVEL MAY AFFECT RESULT   Indirect Bilirubin 0.2 (*) 0.3 - 0.9 mg/dL  CG4 I-STAT (LACTIC ACID)     Status: None   Collection Time    08/06/12  1:09 PM      Result Value Range   Lactic Acid, Venous 1.20  0.5 - 2.2 mmol/L  CBC     Status: Abnormal   Collection Time    08/06/12  1:12 PM      Result Value Range  WBC 3.5 (*) 4.0 - 10.5 K/uL   Comment: WHITE COUNT CONFIRMED ON SMEAR   RBC 3.53 (*) 3.87 - 5.11 MIL/uL   Hemoglobin 10.3 (*) 12.0 - 15.0 g/dL   HCT 16.1 (*) 09.6 - 04.5 %   MCV 88.4  78.0 - 100.0 fL   MCH 29.2  26.0 - 34.0 pg   MCHC 33.0  30.0 - 36.0 g/dL   RDW 40.9  81.1 - 91.4 %   Platelets PLATELET CLUMPS NOTED ON SMEAR, UNABLE TO ESTIMATE  150 - 400 K/uL  BASIC METABOLIC PANEL     Status: Abnormal   Collection Time    08/06/12  1:12 PM      Result Value Range   Sodium 132 (*) 135 - 145 mEq/L   Potassium 5.8 (*) 3.5 - 5.1 mEq/L   Chloride 91 (*) 96 - 112 mEq/L   CO2 22  19 - 32 mEq/L   Glucose, Bld 92  70 - 99 mg/dL   BUN 81 (*) 6 - 23 mg/dL   Creatinine, Ser 7.82 (*) 0.50 - 1.10 mg/dL   Calcium 9.7  8.4 - 95.6 mg/dL   GFR calc non Af Amer 4 (*) >90 mL/min   GFR calc Af Amer 4 (*) >90 mL/min   Comment:            The eGFR has been calculated     using the CKD EPI equation.     This calculation has not been     validated in all clinical     situations.     eGFR's persistently     <90 mL/min signify     possible Chronic Kidney Disease.  RENAL FUNCTION PANEL     Status: Abnormal   Collection Time    08/06/12  8:04 PM      Result  Value Range   Sodium 131 (*) 135 - 145 mEq/L   Potassium 5.7 (*) 3.5 - 5.1 mEq/L   Chloride 90 (*) 96 - 112 mEq/L   CO2 23  19 - 32 mEq/L   Glucose, Bld 79  70 - 99 mg/dL   BUN 84 (*) 6 - 23 mg/dL   Creatinine, Ser 2.13 (*) 0.50 - 1.10 mg/dL   Calcium 9.3  8.4 - 08.6 mg/dL   Phosphorus 8.3 (*) 2.3 - 4.6 mg/dL   Albumin 2.8 (*) 3.5 - 5.2 g/dL   GFR calc non Af Amer 3 (*) >90 mL/min   GFR calc Af Amer 4 (*) >90 mL/min   Comment:            The eGFR has been calculated     using the CKD EPI equation.     This calculation has not been     validated in all clinical     situations.     eGFR's persistently     <90 mL/min signify     possible Chronic Kidney Disease.  MRSA PCR SCREENING     Status: None   Collection Time    08/07/12  3:35 AM      Result Value Range   MRSA by PCR NEGATIVE  NEGATIVE   Comment:            The GeneXpert MRSA Assay (FDA     approved for NASAL specimens     only), is one component of a     comprehensive MRSA colonization     surveillance program. It is not     intended to diagnose  MRSA     infection nor to guide or     monitor treatment for     MRSA infections.  COMPREHENSIVE METABOLIC PANEL     Status: Abnormal   Collection Time    08/07/12  4:00 AM      Result Value Range   Sodium 140  135 - 145 mEq/L   Comment: DELTA CHECK NOTED   Potassium 4.0  3.5 - 5.1 mEq/L   Comment: DELTA CHECK NOTED   Chloride 99  96 - 112 mEq/L   Comment: DELTA CHECK NOTED   CO2 28  19 - 32 mEq/L   Glucose, Bld 68 (*) 70 - 99 mg/dL   BUN 23  6 - 23 mg/dL   Comment: DELTA CHECK NOTED   Creatinine, Ser 4.22 (*) 0.50 - 1.10 mg/dL   Comment: DELTA CHECK NOTED   Calcium 9.4  8.4 - 10.5 mg/dL   Total Protein 7.2  6.0 - 8.3 g/dL   Albumin 2.9 (*) 3.5 - 5.2 g/dL   AST 13  0 - 37 U/L   ALT 23  0 - 35 U/L   Alkaline Phosphatase 81  39 - 117 U/L   Total Bilirubin 0.3  0.3 - 1.2 mg/dL   GFR calc non Af Amer 9 (*) >90 mL/min   GFR calc Af Amer 11 (*) >90 mL/min   Comment:             The eGFR has been calculated     using the CKD EPI equation.     This calculation has not been     validated in all clinical     situations.     eGFR's persistently     <90 mL/min signify     possible Chronic Kidney Disease.  CBC     Status: Abnormal   Collection Time    08/07/12  4:00 AM      Result Value Range   WBC 3.4 (*) 4.0 - 10.5 K/uL   RBC 3.50 (*) 3.87 - 5.11 MIL/uL   Hemoglobin 10.3 (*) 12.0 - 15.0 g/dL   HCT 16.1 (*) 09.6 - 04.5 %   MCV 89.7  78.0 - 100.0 fL   MCH 29.4  26.0 - 34.0 pg   MCHC 32.8  30.0 - 36.0 g/dL   RDW 40.9  81.1 - 91.4 %   Platelets 189  150 - 400 K/uL   Ct Abdomen Pelvis Wo Contrast  08/06/2012   *RADIOLOGY REPORT*  Clinical Data: Diffuse abdominal and back pain  CT ABDOMEN AND PELVIS WITHOUT CONTRAST  Technique:  Multidetector CT imaging of the abdomen and pelvis was performed following the standard protocol without intravenous contrast.  Comparison: CT abdomen pelvis - 04/17/2008  Findings:  The lack of intravenous contrast limits the ability to evaluate solid abdominal organs.  There has been interval destruction of the T10 - T11 intervertebral disc space with associated increase sclerosis and irregularity of the adjacent endplates. There is minimal (approximately 5 mm) of herniated disc material towards the spinal canal (axial image 11, series 2) and approximately 25% loss of the T11 vertebral body height.  These findings are associated with development of adjacent paraspinal soft tissue stranding and thickening (images 8 through 13, series 2) and are worrisome for acute diskitis osteomyelitis.  Previously levels of diskitis osteomyelitis at T9 - T10 and L4 - L5 appear grossly unchanged, however there is now near complete bony fusion of the T9 - T10 intervertebral disc space.  Normal hepatic contour.  Normal noncontrast appearance of the decompressed gallbladder.  No ascites.  The bilateral kidneys are atrophic compatible with provided history of  end-stage renal disease.  There is a possible 1.6 x 1.9 cm hypoattenuating lesion arising from the superior pole of the left kidney which is incompletely evaluated on this noncontrast examination (image 23, series 2).  No definite urinary obstruction. No perinephric stranding.  There is mild diffuse thickening of the left adrenal gland without discrete nodule.  Normal noncontrast appearance of the right adrenal gland, pancreas and spleen.  Ingested enteric contrast extends to the level of the distal small bowel.  No evidence of enteric obstruction.  Bowel is normal in course and caliber without discrete area of wall thickening.  No pneumoperitoneum, pneumatosis or portal venous gas.  Atherosclerotic plaque within a tortuous and normal caliber abdominal aorta.  No definite bulky retroperitoneal, mesenteric, pelvic or inguinal lymphadenopathy on this noncontrast examination.  Post hysterectomy.  No discrete adnexal lesion.  No free fluid in the pelvis.  Limited visualization of the lower thorax demonstrates worsening bibasilar opacities.  No definite pleural effusion.  Cardiomegaly.  Calcifications within the mitral valve annulus.  No definite pericardial effusion.  Multiple ventral abdominal wall metallic sutures are redemonstrated.  IMPRESSION: 1.  Findings worrisome for acute diskitis osteomyelitis involving the T10 - T11 intervertebral disc space.  Further evaluation with MRI may be performed as clinically indicated.  2.  Grossly unchanged sequela of prior presumed areas of diskitis osteomyelitis involving the T9 - T10 and L4 - L5 intervertebral disc space levels.  3.  Cardiomegaly.  4.  Indeterminate approximately 1.7 cm left-sided hypoattenuating lesion, incompletely evaluated on this noncontrast examination but may represent a renal cyst.  Further evaluation with non emergent renal ultrasound may be performed as clinically indicated.  Above findings discussed with [place], PA at 1526.   Original Report  Authenticated By: Tacey Ruiz, MD   Dg Chest 2 View  08/06/2012   *RADIOLOGY REPORT*  Clinical Data: Cough.  CHEST - 2 VIEW  Comparison: 03/27/2012  Findings: The cardiopericardial silhouette is enlarged.  Vascular congestion noted with patchy atelectasis or infiltrate bilaterally, left greater than right. Telemetry leads overlie the chest. Imaged bony structures of the thorax are intact.  IMPRESSION: No substantial change.  Cardiomegaly with vascular congestion and bilateral lower lung patchy atelectasis or infiltrate.   Original Report Authenticated By: Kennith Center, M.D.    Review of Systems  Constitutional: Negative for fever and chills.  Respiratory: Negative for shortness of breath.   Cardiovascular: Negative for chest pain.  Gastrointestinal: Negative for nausea, vomiting and abdominal pain.  Musculoskeletal: Positive for back pain.  Neurological: Positive for weakness.    Blood pressure 95/73, pulse 96, temperature 98.9 F (37.2 C), temperature source Oral, resp. rate 18, height 4\' 10"  (1.473 m), weight 125 lb 10.6 oz (57 kg), SpO2 97.00%. Physical Exam  Constitutional: She is oriented to person, place, and time.  Cardiovascular: Normal rate and regular rhythm.   No murmur heard. Respiratory: Effort normal. She has no wheezes.  GI: Soft. Bowel sounds are normal. There is no tenderness.  Musculoskeletal: Normal range of motion.  weak  Neurological: She is alert and oriented to person, place, and time. Coordination normal.  Skin: Skin is warm and dry.  Psychiatric: She has a normal mood and affect. Her behavior is normal. Judgment and thought content normal.     Assessment/Plan Back pain x 1 week Weakness; fatigue CT shows T10-11 diskitis  Scheduled for disc aspiration Pt aware of procedure benefits and risks and agreeable to proceed Consent signed and in chart  Ajani Schnieders A 08/07/2012, 8:01 AM

## 2012-08-08 ENCOUNTER — Inpatient Hospital Stay (HOSPITAL_COMMUNITY): Payer: Medicare Other

## 2012-08-08 LAB — RENAL FUNCTION PANEL
BUN: 39 mg/dL — ABNORMAL HIGH (ref 6–23)
CO2: 24 mEq/L (ref 19–32)
GFR calc Af Amer: 6 mL/min — ABNORMAL LOW (ref >90)
Glucose, Bld: 103 mg/dL — ABNORMAL HIGH (ref 70–99)
Potassium: 4 mEq/L (ref 3.5–5.1)
Sodium: 134 mEq/L — ABNORMAL LOW (ref 135–145)

## 2012-08-08 LAB — CBC
HCT: 30.7 % — ABNORMAL LOW (ref 36.0–46.0)
Hemoglobin: 10 g/dL — ABNORMAL LOW (ref 12.0–15.0)
MCV: 90.6 fL (ref 78.0–100.0)
RBC: 3.39 MIL/uL — ABNORMAL LOW (ref 3.87–5.11)
WBC: 3.8 10*3/uL — ABNORMAL LOW (ref 4.0–10.5)

## 2012-08-08 MED ORDER — QUETIAPINE FUMARATE 50 MG PO TABS
50.0000 mg | ORAL_TABLET | Freq: Every day | ORAL | Status: DC
  Administered 2012-08-08 – 2012-08-10 (×3): 50 mg via ORAL
  Filled 2012-08-08 (×4): qty 1

## 2012-08-08 MED ORDER — FENTANYL 12 MCG/HR TD PT72
12.5000 ug | MEDICATED_PATCH | TRANSDERMAL | Status: DC
  Administered 2012-08-08: 12.5 ug via TRANSDERMAL
  Filled 2012-08-08: qty 1

## 2012-08-08 MED ORDER — LACTULOSE 10 GM/15ML PO SOLN
30.0000 g | Freq: Every day | ORAL | Status: DC | PRN
  Administered 2012-08-08: 30 g via ORAL
  Filled 2012-08-08: qty 45

## 2012-08-08 MED ORDER — FENTANYL 25 MCG/HR TD PT72
25.0000 ug | MEDICATED_PATCH | TRANSDERMAL | Status: DC
  Administered 2012-08-11: 25 ug via TRANSDERMAL
  Filled 2012-08-08: qty 1

## 2012-08-08 MED ORDER — OXYCODONE HCL 5 MG PO TABS
10.0000 mg | ORAL_TABLET | ORAL | Status: DC | PRN
  Administered 2012-08-08 – 2012-08-11 (×5): 10 mg via ORAL
  Filled 2012-08-08 (×4): qty 2

## 2012-08-08 NOTE — Progress Notes (Signed)
[facility] Progress Note  Subjective:   Hands are bothering her the most.  Back not hurting as bad. No recent BM, but doesn't want laxative yet.  Objective Filed Vitals:   08/07/12 1231 08/07/12 1712 08/07/12 2024 08/08/12 0427  BP: 123/87 110/67 112/57 120/57  Pulse: 78 75 79 62  Temp: 98.6 F (37 C) 98.6 F (37 C) 98.7 F (37.1 C) 98.2 F (36.8 C)  TempSrc: Oral Oral Oral Oral  Resp: 18 18 18 18   Height:   4\' 10"  (1.473 m)   Weight:   57 kg (125 lb 10.6 oz)   SpO2: 100% 100% 100% 96%   Physical Exam General: sleeping, wakens easily, moves in bed with difficulty due to pain crying very down, depressed Heart: RRR 2/6 murmur Lungs: no wheezes or rales Abdomen: soft NT liver down 4 cm Extremities: no sign edema on right; tr on left Dialysis Access: left upper AVF + bruit and thrill olc avg la  Dialysis Orders: Center: [facility].  EDW 60.5 HD Bath 2K/2Ca Time 3:30 Heparin NONE. Access LUA AVF BFR 400 DFR 600  Hectorol 4 mcg IV/HD Epogen 1800 Units IV/HD Venofer 50 mg q week  Labs: Hgb 11.9, Tsat 36%, Ferritin 764 in April, Phos 8.4, PTH 959.6, Ca 9   Assessment/Plan:  1. 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 Vanc and cefepime started 7/19 after aspiration; BC and aspiration culture pending; afebrile. ESR 65 CRP 8.8 . (note CT shows grossly unchanged sequela of prior presumed areas of diskitis osteomyelitis involving the T9 - T10 and L4 - L5 intervertebral disc space levels)= what pat said she had about 10 years ago while living in [place] 2. ESRD/Hyperkalemia - TTS hx noncompliance with HD; K corrected with dialysis Friday (4 7/19) - had HD on off day - plan next HD Monday; on NO  heparin HD however, it is noted that she is on SQ heparin 3. Hypertension/volume - SBPs variable - . Home amlodipine, coreg and clonidine held for now. Most recent BP in the 90s . Net UF 2.7L Friday - looks like post HD weight was about 58.6 - probably can use less meds with lower  volume- will need new lower edw at d/c 4. Anemia -Hgb stable 10.3 - Aranesp 60, weeklyl low dose IV Fe 5. Metabolic bone disease - . ^Phos and PTH op. Reportedly has had no binders for 3 weeks due to cost. Continue hectorol, phoslo and low Ca bath 6. Nutrition - Alb 2.9. NPO for now. Renal diet when appropriate. Multivitamin, 7. HX CVA - left side deficit 8. Carpal Tunnel syndrome - Has had op eval and been approved for surgery. Procedure cancelled due to cost. 9. Hx dialysis and medical noncompliance, choices  Sheffield Slider, PA-C [facility] [phone] 08/08/2012,8:04 AM  LOS: 2 days  I have seen and examined this patient and agree with the plan of care  Seen eval, examined, counseled.  Severe depression, discussed goals, small step and not futile yet .  Jujuan Dugo L 08/08/2012, 10:34 AM    Additional Objective Labs: Basic Metabolic Panel:  Recent Labs Lab 08/06/12 1312 08/06/12 2004 08/07/12 0400  NA 132* 131* 140  K 5.8* 5.7* 4.0  CL 91* 90* 99  CO2 22 23 28   GLUCOSE 92 79 68*  BUN 81* 84* 23  CREATININE 9.45* 9.61* 4.22*  CALCIUM 9.7 9.3 9.4  PHOS  --  8.3*  --    Liver Function Tests:  Recent Labs  Lab 08/06/12 1200 08/06/12 2004 08/07/12 0400  AST 16  --  13  ALT 29  --  23  ALKPHOS 86  --  81  BILITOT 0.3  --  0.3  PROT 7.6  --  7.2  ALBUMIN 2.9* 2.8* 2.9*   CBC:  Recent Labs Lab 08/06/12 1312 08/07/12 0400  WBC 3.5* 3.4*  HGB 10.3* 10.3*  HCT 31.2* 31.4*  MCV 88.4 89.7  PLT PLATELET CLUMPS NOTED ON SMEAR, UNABLE TO ESTIMATE 189   Blood Culture    Component Value Date/Time   SDES FLUID  T10,T11 DISC ASPIRATE 08/07/2012 1123   SPECREQUEST NONE 08/07/2012 1123   CULT PENDING 08/07/2012 1123   REPTSTATUS PENDING 08/07/2012 1123  CBG:  Recent Labs Lab 08/07/12 1658  GLUCAP 111*  Medications:   . calcium acetate  2,001 mg Oral TID WC  . capsaicin   Topical BID  . [START ON 08/09/2012] ceFEPime (MAXIPIME) IV  2 g  Intravenous Once  . darbepoetin (ARANESP) injection - DIALYSIS  60 mcg Intravenous Q Fri-HD  . [START ON 08/12/2012] ferric gluconate (FERRLECIT/NULECIT) IV  125 mg Intravenous Q Thu-HD  . heparin  5,000 Units Subcutaneous Q8H  .  HYDROmorphone (DILAUDID) injection  1 mg Intravenous Once  . multivitamin  1 tablet Oral Q supper  . pantoprazole  80 mg Oral Q1200  . rOPINIRole  0.5 mg Oral Daily  . sodium chloride  3 mL Intravenous Q12H  . [START ON 08/09/2012] vancomycin (VANCOCIN) 500 mg IVPB  500 mg Intravenous Once

## 2012-08-08 NOTE — Progress Notes (Signed)
Thank you for consulting the Palliative Medicine Team at [facility] to meet your patient's and family's needs.   The reason that you asked [place] to see your patient is  For Goals of Care, Symptom Management, Care Coordination   We have scheduled your patient for a meeting: 7/21 8:30AM  The Surrogate decision make is: no surrogate-needs to be established Contact information:none  Other family members that need to be present: none, patient says even though she has family she is alone    Your patient is able/unable to participate:yes  Additional Narrative: Issues with chronic pain, insomnia, anxiety, depression-Patient having severe pain in her bilateral arms-likely from neuropathy. Increased dose of fentanyl patch to 25 mcg and added seroquel QHS until full consult can be obtained.

## 2012-08-08 NOTE — Progress Notes (Addendum)
[facility] PROGRESS NOTE  Julia Small JYN:[id number] DOB: 12/07/1935 DOA: 08/06/2012 PCP: No primary provider on file.  Assessment/Plan  Osteomyelitis/diskitis T10-11  -  Bone biopsy NGTD -  Blood cx NGTD -  ESR 65 and CRP 8.8 -  Continue vancomycin and cefepime pending culture results -  ECHO to rule out endocarditis as source  Left kidney lesion, possible cyst vs. malignancy -  RUS:  renal sinus cyst which is complicated by an internal area of septation -  Radiology recommending a pre and post contrast CT - would need to be coordinated with HD   End-stage renal disease on hemodialysis Tuesday, Thursday, Saturday. -  Next hemodialysis on Monday -  Patient has history of noncompliance, we'll have social worker find out if there is a problem with her returning to her previous dialysis center  Hyperkalemia resolved with hemodialysis   HTN blood pressures have been low normal - Continue to hold blood pressure medications  Anemia of renal parenchymal disease - Per nephrology  GERD stable, continue high dose PPI  Hx of CVA with residual side weakness and facial droop  Carpal tunnel and chronic pain  -  Start fentanyl patch -  Continue vicodin prn  Progressive weakness - somewhat acute on chronic, uses a walker at home  - PT recommending SNF, however, patient declines at this time  Diet:  Renal diet Access:  PIV IVF:  OFF Proph:  heparin  Code Status: Full Family Communication: spoke with the patient alone Disposition Plan: pending results of culture from disk and blood cultures   Consultants:  Nephrology  Radiology  Procedures:  CT-guided biopsy on 7/18   Antibiotics:  Vancomycin 7/19 >>  Cefepime 7/19 >>   HPI/Subjective:  Patient states that she feels ill, chronically in pain and without resources to afford even her basic medications.  States she does not qualify for medicaid.  States she is depressed.    Objective: Filed Vitals:    08/07/12 1712 08/07/12 2024 08/08/12 0427 08/08/12 0957  BP: 110/67 112/57 120/57 108/65  Pulse: 75 79 62 70  Temp: 98.6 F (37 C) 98.7 F (37.1 C) 98.2 F (36.8 C) 98.3 F (36.8 C)  TempSrc: Oral Oral Oral Oral  Resp: 18 18 18 18   Height:  4\' 10"  (1.473 m)    Weight:  57 kg (125 lb 10.6 oz)    SpO2: 100% 100% 96% 100%    Intake/Output Summary (Last 24 hours) at 08/08/12 1000 Last data filed at 08/08/12 0900  Gross per 24 hour  Intake    320 ml  Output      0 ml  Net    320 ml   Filed Weights   08/06/12 1915 08/07/12 0025 08/07/12 2024  Weight: 58.6 kg (129 lb 3 oz) 57 kg (125 lb 10.6 oz) 57 kg (125 lb 10.6 oz)    Exam:   General:  Thin African American female, Tearful  HEENT:  NCAT, MMM  Cardiovascular:  RRR, nl S1, S2 radiation of bruit from arm, 2+ pulses, warm extremities  Respiratory:  CTAB, no increased WOB  Abdomen:  NABS, soft, NT/ND  MSK:  Normal tone and bulk, no LEE.  Patient continues to have no point tenderness to palpation of the spinous.  Bandage from biopsy c/d/i.   Neuro:  Grossly intact mild facial asymmetry, 3/5 hand grip on the right, possibly secondary to pain.  Data Reviewed: Basic Metabolic Panel:  Recent Labs Lab 08/06/12 1312 08/06/12 2004 08/07/12 0400  NA 132* 131* 140  K 5.8* 5.7* 4.0  CL 91* 90* 99  CO2 22 23 28   GLUCOSE 92 79 68*  BUN 81* 84* 23  CREATININE 9.45* 9.61* 4.22*  CALCIUM 9.7 9.3 9.4  PHOS  --  8.3*  --    Liver Function Tests:  Recent Labs Lab 08/06/12 1200 08/06/12 2004 08/07/12 0400  AST 16  --  13  ALT 29  --  23  ALKPHOS 86  --  81  BILITOT 0.3  --  0.3  PROT 7.6  --  7.2  ALBUMIN 2.9* 2.8* 2.9*   No results found for this basename: LIPASE, AMYLASE,  in the last 168 hours No results found for this basename: AMMONIA,  in the last 168 hours CBC:  Recent Labs Lab 08/06/12 1312 08/07/12 0400  WBC 3.5* 3.4*  HGB 10.3* 10.3*  HCT 31.2* 31.4*  MCV 88.4 89.7  PLT PLATELET CLUMPS NOTED ON  SMEAR, UNABLE TO ESTIMATE 189   Cardiac Enzymes: No results found for this basename: CKTOTAL, CKMB, CKMBINDEX, TROPONINI,  in the last 168 hours BNP (last 3 results) No results found for this basename: PROBNP,  in the last 8760 hours CBG:  Recent Labs Lab 08/07/12 1658  GLUCAP 111*    Recent Results (from the past 240 hour(s))  MRSA PCR SCREENING     Status: None   Collection Time    08/07/12  3:35 AM      Result Value Range Status   MRSA by PCR NEGATIVE  NEGATIVE Final   Comment:            The GeneXpert MRSA Assay (FDA     approved for NASAL specimens     only), is one component of a     comprehensive MRSA colonization     surveillance program. It is not     intended to diagnose MRSA     infection nor to guide or     monitor treatment for     MRSA infections.  BODY FLUID CULTURE     Status: None   Collection Time    08/07/12 11:23 AM      Result Value Range Status   Specimen Description FLUID  T10,T11 DISC ASPIRATE   Final   Special Requests NONE   Final   Gram Stain     Final   Value: WBC PRESENT,BOTH PMN AND MONONUCLEAR     NO ORGANISMS SEEN   Culture PENDING   Incomplete   Report Status PENDING   Incomplete     Studies: Ct Abdomen Pelvis Wo Contrast  08/06/2012   *RADIOLOGY REPORT*  Clinical Data: Diffuse abdominal and back pain  CT ABDOMEN AND PELVIS WITHOUT CONTRAST  Technique:  Multidetector CT imaging of the abdomen and pelvis was performed following the standard protocol without intravenous contrast.  Comparison: CT abdomen pelvis - 04/17/2008  Findings:  The lack of intravenous contrast limits the ability to evaluate solid abdominal organs.  There has been interval destruction of the T10 - T11 intervertebral disc space with associated increase sclerosis and irregularity of the adjacent endplates. There is minimal (approximately 5 mm) of herniated disc material towards the spinal canal (axial image 11, series 2) and approximately 25% loss of the T11 vertebral  body height.  These findings are associated with development of adjacent paraspinal soft tissue stranding and thickening (images 8 through 13, series 2) and are worrisome for acute diskitis osteomyelitis.  Previously levels of diskitis osteomyelitis at T9 -  T10 and L4 - L5 appear grossly unchanged, however there is now near complete bony fusion of the T9 - T10 intervertebral disc space.  Normal hepatic contour.  Normal noncontrast appearance of the decompressed gallbladder.  No ascites.  The bilateral kidneys are atrophic compatible with provided history of end-stage renal disease.  There is a possible 1.6 x 1.9 cm hypoattenuating lesion arising from the superior pole of the left kidney which is incompletely evaluated on this noncontrast examination (image 23, series 2).  No definite urinary obstruction. No perinephric stranding.  There is mild diffuse thickening of the left adrenal gland without discrete nodule.  Normal noncontrast appearance of the right adrenal gland, pancreas and spleen.  Ingested enteric contrast extends to the level of the distal small bowel.  No evidence of enteric obstruction.  Bowel is normal in course and caliber without discrete area of wall thickening.  No pneumoperitoneum, pneumatosis or portal venous gas.  Atherosclerotic plaque within a tortuous and normal caliber abdominal aorta.  No definite bulky retroperitoneal, mesenteric, pelvic or inguinal lymphadenopathy on this noncontrast examination.  Post hysterectomy.  No discrete adnexal lesion.  No free fluid in the pelvis.  Limited visualization of the lower thorax demonstrates worsening bibasilar opacities.  No definite pleural effusion.  Cardiomegaly.  Calcifications within the mitral valve annulus.  No definite pericardial effusion.  Multiple ventral abdominal wall metallic sutures are redemonstrated.  IMPRESSION: 1.  Findings worrisome for acute diskitis osteomyelitis involving the T10 - T11 intervertebral disc space.  Further  evaluation with MRI may be performed as clinically indicated.  2.  Grossly unchanged sequela of prior presumed areas of diskitis osteomyelitis involving the T9 - T10 and L4 - L5 intervertebral disc space levels.  3.  Cardiomegaly.  4.  Indeterminate approximately 1.7 cm left-sided hypoattenuating lesion, incompletely evaluated on this noncontrast examination but may represent a renal cyst.  Further evaluation with non emergent renal ultrasound may be performed as clinically indicated.  Above findings discussed with [place], PA at 1526.   Original Report Authenticated By: Tacey Ruiz, MD   Dg Chest 2 View  08/06/2012   *RADIOLOGY REPORT*  Clinical Data: Cough.  CHEST - 2 VIEW  Comparison: 03/27/2012  Findings: The cardiopericardial silhouette is enlarged.  Vascular congestion noted with patchy atelectasis or infiltrate bilaterally, left greater than right. Telemetry leads overlie the chest. Imaged bony structures of the thorax are intact.  IMPRESSION: No substantial change.  Cardiomegaly with vascular congestion and bilateral lower lung patchy atelectasis or infiltrate.   Original Report Authenticated By: Kennith Center, M.D.   Ir Lumbar Disc Aspiration W/img Guide  08/07/2012   *RADIOLOGY REPORT*  CT ASPIRATION  Date: 08/07/2012  Clinical History: 77 year old female with osteomyelitis diskitis of the T10-T11 interspace.  CT guided aspiration is requested to facilitate passage of identification prior to initiation of intravenous antibiotics.  Procedures Performed: 1. CT guided aspiration of the T10-T11 disc space  Interventional Radiologist:  Sterling Big, MD  Sedation: Moderate (conscious) sedation was used.  2.5 mg Versed, 100 mcg Fentanyl were administered intravenously.  The patient's vital signs were monitored continuously by radiology nursing throughout the procedure.  Sedation Time: 30 minutes  Fluoroscopy time: 3 minutes  Contrast volume: None  PROCEDURE/FINDINGS:   Informed consent was obtained  from the patient following explanation of the procedure, risks, benefits and alternatives. The patient understands, agrees and consents for the procedure. All questions were addressed. A time out was performed.  Maximal barrier sterile technique utilized including caps,  mask, sterile gowns, sterile gloves, large sterile drape, hand hygiene, and betadine skin prep.  A planning axial CT scan was performed.  The T10-T11 disc space was successfully identified.  A suitable skin entry site was selected and marked.  Using snap shot CT guidance, an 18 gauge trocar needle was carefully advanced from an oblique approach into the T10-T11 disc space.  Aspiration and agitation of the disc space new approximately 1 ml of bloody fluid.  This was dilated and a small amount of saline within the aspiration syringe.  The needle was removed.  Post aspiration axial CT imaging demonstrates no evidence of immediate complication or hemorrhage.  The patient tolerated the procedure well and was returned to her room in stable condition.  IMPRESSION:  Technically successful CT-guided aspiration of the T10-T11 disc space yielding approximately 1 ml of bloody fluid which is dilated and a small amount of saline and sent to the lab for culture and sensitivities.  Signed,  Sterling Big, MD Vascular & Interventional Radiologist [facility] Radiology   Original Report Authenticated By: Malachy Moan, M.D.    Scheduled Meds: . calcium acetate  2,001 mg Oral TID WC  . capsaicin   Topical BID  . [START ON 08/09/2012] ceFEPime (MAXIPIME) IV  2 g Intravenous Once  . darbepoetin (ARANESP) injection - DIALYSIS  60 mcg Intravenous Q Fri-HD  . [START ON 08/12/2012] ferric gluconate (FERRLECIT/NULECIT) IV  125 mg Intravenous Q Thu-HD  . heparin  5,000 Units Subcutaneous Q8H  .  HYDROmorphone (DILAUDID) injection  1 mg Intravenous Once  . multivitamin  1 tablet Oral Q supper  . pantoprazole  80 mg Oral Q1200  . rOPINIRole  0.5 mg Oral Daily   . sodium chloride  3 mL Intravenous Q12H  . [START ON 08/09/2012] vancomycin (VANCOCIN) 500 mg IVPB  500 mg Intravenous Once   Continuous Infusions:   Active Problems:   ESRD (end stage renal disease) on dialysis   Osteomyelitis   Diskitis   HTN (hypertension)   GERD (gastroesophageal reflux disease)    Time spent: 30 min    Ayona Yniguez, [facility]  [facility] Pager [phone]. If 7PM-7AM, please contact night-coverage at [email or web address], password [facility] 08/08/2012, 10:00 AM  LOS: 2 days

## 2012-08-08 NOTE — Progress Notes (Signed)
Clinical Social Work Department BRIEF PSYCHOSOCIAL ASSESSMENT 08/08/2012  Patient:  Julia Small, Julia Small     Account Number:  [id number]     Admit date:  08/06/2012  Clinical Social Worker:  Illene Silver  Date/Time:  08/08/2012 04:40 PM  Referred by:  Physician  Date Referred:   Referred for  Other - See comment   Other Referral:   non compliance with dialysis   Interview type:  Patient Other interview type:    PSYCHOSOCIAL DATA Living Status:  ALONE Admitted from facility:   Level of care:   Primary support name:   Primary support relationship to patient:  FAMILY Degree of support available:   Pt reports that although she lives near family, she gets little support.    CURRENT CONCERNS Current Concerns  Other - See comment   Other Concerns:   compliance with dialysis    SOCIAL WORK ASSESSMENT / PLAN CSW met with pt who denies non-compliance with dialysis. Pt has transportation arranged and states that she goes as she is scheduled.  Pt states that she needs a Child psychotherapist to get her assistance, not to be concerned with her dialysis.  Pt unable to stay on track of conversation and went from topic to topic.  pt depressed that she cannot get food stamps and medicaid like she had in [place].  pt also not happy with the reputation of the neighborhood in which she lives.  Conversation cut short b/c MD came in and then pt went to [place].  Unit CSW to f/u.   Assessment/plan status:  Psychosocial Support/Ongoing Assessment of Needs Other assessment/ plan:   Unit CSW to f/u re: status of pt's Medicaid.   Information/referral to community resources:    PATIENT'S/FAMILY'S RESPONSE TO PLAN OF CARE: Pt depressed about her many life stressors.

## 2012-08-09 ENCOUNTER — Inpatient Hospital Stay (HOSPITAL_COMMUNITY): Payer: Medicare Other

## 2012-08-09 ENCOUNTER — Encounter (HOSPITAL_COMMUNITY): Payer: Self-pay | Admitting: Radiology

## 2012-08-09 DIAGNOSIS — I330 Acute and subacute infective endocarditis: Secondary | ICD-10-CM | POA: Clinically undetermined

## 2012-08-09 DIAGNOSIS — R531 Weakness: Secondary | ICD-10-CM

## 2012-08-09 DIAGNOSIS — M549 Dorsalgia, unspecified: Secondary | ICD-10-CM

## 2012-08-09 DIAGNOSIS — Z515 Encounter for palliative care: Secondary | ICD-10-CM

## 2012-08-09 DIAGNOSIS — K5900 Constipation, unspecified: Secondary | ICD-10-CM

## 2012-08-09 LAB — RENAL FUNCTION PANEL
BUN: 47 mg/dL — ABNORMAL HIGH (ref 6–23)
CO2: 24 mEq/L (ref 19–32)
Chloride: 95 mEq/L — ABNORMAL LOW (ref 96–112)
Creatinine, Ser: 7.65 mg/dL — ABNORMAL HIGH (ref 0.50–1.10)
GFR calc non Af Amer: 5 mL/min — ABNORMAL LOW (ref >90)

## 2012-08-09 LAB — CBC
HCT: 31.2 % — ABNORMAL LOW (ref 36.0–46.0)
Hemoglobin: 9.8 g/dL — ABNORMAL LOW (ref 12.0–15.0)
MCV: 92 fL (ref 78.0–100.0)
Platelets: 217 10*3/uL (ref 150–400)
RBC: 3.39 MIL/uL — ABNORMAL LOW (ref 3.87–5.11)
WBC: 3.5 10*3/uL — ABNORMAL LOW (ref 4.0–10.5)

## 2012-08-09 MED ORDER — LIDOCAINE-PRILOCAINE 2.5-2.5 % EX CREA: 1.0000 "application " | TOPICAL_CREAM | CUTANEOUS | Status: DC | PRN

## 2012-08-09 MED ORDER — PENTAFLUOROPROP-TETRAFLUOROETH EX AERO: 1.0000 "application " | INHALATION_SPRAY | CUTANEOUS | Status: DC | PRN

## 2012-08-09 MED ORDER — SODIUM CHLORIDE 0.9 % IV SOLN: 100.0000 mL | INTRAVENOUS | Status: DC | PRN

## 2012-08-09 MED ORDER — NEPRO/CARBSTEADY PO LIQD: 237.0000 mL | ORAL | Status: DC | PRN

## 2012-08-09 MED ORDER — IOHEXOL 300 MG/ML  SOLN
100.0000 mL | Freq: Once | INTRAMUSCULAR | Status: AC | PRN
  Administered 2012-08-09: 100 mL via INTRAVENOUS

## 2012-08-09 MED ORDER — HEPARIN SODIUM (PORCINE) 1000 UNIT/ML DIALYSIS
1000.0000 [IU] | INTRAMUSCULAR | Status: DC | PRN
  Filled 2012-08-09: qty 1

## 2012-08-09 MED ORDER — DEXTROSE 5 % IV SOLN
2.0000 g | Freq: Once | INTRAVENOUS | Status: AC
  Administered 2012-08-09: 2 g via INTRAVENOUS
  Filled 2012-08-09: qty 2

## 2012-08-09 MED ORDER — ALTEPLASE 2 MG IJ SOLR
2.0000 mg | Freq: Once | INTRAMUSCULAR | Status: AC | PRN
  Filled 2012-08-09: qty 2

## 2012-08-09 MED ORDER — LIDOCAINE HCL (PF) 1 % IJ SOLN: 5.0000 mL | INTRAMUSCULAR | Status: DC | PRN

## 2012-08-09 MED ORDER — IOHEXOL 300 MG/ML  SOLN: 100.0000 mL | Freq: Once | INTRAMUSCULAR | Status: DC | PRN

## 2012-08-09 NOTE — Progress Notes (Signed)
[facility] PROGRESS NOTE  Julia Small ZHY:[id number] DOB: December 19, 1935 DOA: 08/06/2012 PCP: No primary provider on file.  Assessment/Plan  Osteomyelitis/diskitis T10-11  -  Bone biopsy NGTD -  Blood cx NGTD -  ESR 65 and CRP 8.8 -  Anticipate long course of broad spectrum ABX.  vancomycin and ceftaz for minimum of 6 weeks at HD with follow up with Infectious Disease.   -  ECHO to rule out endocarditis as source  Left kidney lesion, possible cyst vs. malignancy -  RUS:  renal sinus cyst which is complicated by an internal area of septation -  Have ordered pre and post contrast CT  End-stage renal disease on hemodialysis Tuesday, Thursday, Saturday. 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  Offered further investigation of her hand pain, however, she declined at this time.   -  Continue fentanyl patch at (do NOT increase for at least 3-5 days, such as Friday) -  Continue vicodin prn  Progressive weakness - somewhat acute on chronic, uses a walker at home  - PT recommending SNF, however, patient continues to want home care if possible and does not want to go do SNF.    Tearful and likely depressed -  Will discuss possible psych consultation  Diet:  Renal diet Access:  PIV IVF:  OFF Proph:  heparin  Code Status: Full Family Communication: spoke with the patient alone Disposition Plan:  Awaiting CT to characterize her liver lesion and arrangements for home health versus SNF.  Patient appears to be trying to reconcile  going to SNF.   Home possibly tomorrow.     Consultants:  Nephrology  Radiology  Procedures:  CT-guided biopsy on 7/18   Antibiotics:  Vancomycin 7/19 >>  Cefepime 7/19 >> 7/21  Ceftaz 7/21 >>  HPI/Subjective:  Patient states that she feels ill and continues to have pain.   Pain in back improved but persistent.    Objective: Filed Vitals:   08/08/12 1502 08/08/12 1816 08/08/12 2023 08/09/12 0953  BP: 112/58 121/60 118/69 126/66  Pulse: 74 73 72 71  Temp: 98.9 F (37.2 C) 98.6 F (37 C) 99 F (37.2 C) 98.8 F (37.1 C)  TempSrc: Oral Oral Oral Oral  Resp: 18 18 18 18   Height:   4\' 10"  (1.473 m)   Weight:   57 kg (125 lb 10.6 oz)   SpO2: 100% 100% 100% 100%    Intake/Output Summary (Last 24 hours) at 08/09/12 1220 Last data filed at 08/09/12 0900  Gross per 24 hour  Intake    240 ml  Output      0 ml  Net    240 ml   Filed Weights   08/07/12 0025 08/07/12 2024 08/08/12 2023  Weight: 57 kg (125 lb 10.6 oz) 57 kg (125 lb 10.6 oz) 57 kg (125 lb 10.6 oz)    Exam:   General:  Thin African American female, Tearful  HEENT:  NCAT, MMM  Cardiovascular:  RRR, nl S1, S2 radiation of bruit from arm, 2+ pulses,  warm extremities  Respiratory:  CTAB, no increased WOB  Abdomen:  NABS, soft, NT/ND  MSK:  Normal tone and bulk, no LEE.   Hands are swollen warm and tender.    Neuro:  Grossly intact mild facial asymmetry, 3/5 hand grip on the right, possibly secondary to pain.     Data Reviewed: Basic Metabolic Panel:  Recent Labs Lab 08/06/12 1312 08/06/12 2004 08/07/12 0400 08/08/12 1226  NA 132* 131* 140 134*  K 5.8* 5.7* 4.0 4.0  CL 91* 90* 99 95*  CO2 22 23 28 24   GLUCOSE 92 79 68* 103*  BUN 81* 84* 23 39*  CREATININE 9.45* 9.61* 4.22* 6.49*  CALCIUM 9.7 9.3 9.4 10.0  PHOS  --  8.3*  --  5.6*   Liver Function Tests:  Recent Labs Lab 08/06/12 1200 08/06/12 2004 08/07/12 0400 08/08/12 1226  AST 16  --  13  --   ALT 29  --  23  --    ALKPHOS 86  --  81  --   BILITOT 0.3  --  0.3  --   PROT 7.6  --  7.2  --   ALBUMIN 2.9* 2.8* 2.9* 2.7*   No results found for this basename: LIPASE, AMYLASE,  in the last 168 hours No results found for this basename: AMMONIA,  in the last 168 hours CBC:  Recent Labs Lab 08/06/12 1312 08/07/12 0400 08/08/12 1226  WBC 3.5* 3.4* 3.8*  HGB 10.3* 10.3* 10.0*  HCT 31.2* 31.4* 30.7*  MCV 88.4 89.7 90.6  PLT PLATELET CLUMPS NOTED ON SMEAR, UNABLE TO ESTIMATE 189 172   Cardiac Enzymes: No results found for this basename: CKTOTAL, CKMB, CKMBINDEX, TROPONINI,  in the last 168 hours BNP (last 3 results) No results found for this basename: PROBNP,  in the last 8760 hours CBG:  Recent Labs Lab 08/07/12 1658  GLUCAP 111*    Recent Results (from the past 240 hour(s))  MRSA PCR SCREENING     Status: None   Collection Time    08/07/12  3:35 AM      Result Value Range Status   MRSA by PCR NEGATIVE  NEGATIVE Final   Comment:            The GeneXpert MRSA Assay (FDA     approved for NASAL specimens     only), is one component of a     comprehensive MRSA colonization     surveillance program. It is not     intended to diagnose MRSA     infection nor to guide or     monitor treatment for     MRSA infections.  BODY FLUID CULTURE     Status: None   Collection Time    08/07/12 11:23 AM      Result Value Range Status   Specimen Description FLUID  T10,T11 DISC ASPIRATE   Final   Special Requests NONE   Final   Gram Stain     Final   Value: WBC PRESENT,BOTH PMN AND MONONUCLEAR     NO ORGANISMS SEEN   Culture NO GROWTH 1 DAY   Final   Report Status PENDING   Incomplete  CULTURE, BLOOD (ROUTINE X 2)     Status: None   Collection Time    08/07/12  4:53 PM      Result Value Range Status   Specimen Description BLOOD RIGHT ARM   Final   Special Requests BOTTLES DRAWN AEROBIC ONLY 3CC  Final   Culture  Setup Time 08/08/2012 15:02   Final   Culture     Final   Value:        BLOOD  CULTURE RECEIVED NO GROWTH TO DATE CULTURE WILL BE HELD FOR 5 DAYS BEFORE ISSUING A FINAL NEGATIVE REPORT   Report Status PENDING   Incomplete  CULTURE, BLOOD (ROUTINE X 2)     Status: None   Collection Time    08/07/12  5:54 PM      Result Value Range Status   Specimen Description BLOOD RIGHT WRIST   Final   Special Requests BOTTLES DRAWN AEROBIC ONLY 3CC   Final   Culture  Setup Time 08/08/2012 15:02   Final   Culture     Final   Value:        BLOOD CULTURE RECEIVED NO GROWTH TO DATE CULTURE WILL BE HELD FOR 5 DAYS BEFORE ISSUING A FINAL NEGATIVE REPORT   Report Status PENDING   Incomplete     Studies: US Renal  08/08/2012   *RADIOLOGY REPORT*  Clinical Data:  .  Evaluate renal cysts.  RENAL/URINARY TRACT ULTRASOUND COMPLETE  Comparison:  None.  Findings:  Right Kidney:  The right kidney is echogenic and atrophic measuring 7.4 cm.  There are multiple cysts within the upper and lower pole. The largest is in the upper pole measuring 1.9 x 1.8 x 1.5 cm.  Left Kidney:  Left kidney is atrophic and echogenic measuring 8.1 cm.  There are several cysts within the upper pole of the left kidney.  The  Renal sinus cyst is complicated by internal area of septation. This measures 1.9 x 1.7 x 1.8 cm. No evidence of mass or hydronephrosis.  Bladder:  Not visualize.  IMPRESSION:  1.  Bilateral atrophic and echogenic kidneys. 2.  Bilateral renal cysts. 3.  Within the upper pole of the left kidney there is a renal sinus cyst which is complicated by an internal area of septation.  This may be better assessed with contrast enhanced cross-sectional imaging.  If the patient is on the long-term dialysis then the best option may be a pre and postcontrast CT of the kidneys.  If the CT contrast material is contraindicated then the next best study would be a noncontrast MRI.   Original Report Authenticated By: Signa Kell, M.D.    Scheduled Meds: . calcium acetate  2,001 mg Oral TID WC  . capsaicin   Topical BID  .  ceFEPime (MAXIPIME) IV  2 g Intravenous Once  . darbepoetin (ARANESP) injection - DIALYSIS  60 mcg Intravenous Q Fri-HD  . [START ON 08/11/2012] fentaNYL  25 mcg Transdermal Q72H  . [START ON 08/12/2012] ferric gluconate (FERRLECIT/NULECIT) IV  125 mg Intravenous Q Thu-HD  . heparin  5,000 Units Subcutaneous Q8H  . multivitamin  1 tablet Oral Q supper  . pantoprazole  80 mg Oral Q1200  . QUEtiapine  50 mg Oral QHS  . rOPINIRole  0.5 mg Oral Daily  . sodium chloride  3 mL Intravenous Q12H  . vancomycin (VANCOCIN) 500 mg IVPB  500 mg Intravenous Once   Continuous Infusions:   Active Problems:   ESRD (end stage renal disease) on dialysis   Osteomyelitis   Diskitis   HTN (hypertension)   GERD (gastroesophageal reflux disease)    Time spent: 30 min    Deyanira Fesler, [facility]  [facility] Pager [phone]. If 7PM-7AM, please contact night-coverage at [email or web address], password [facility] 08/09/2012, 12:20 PM  LOS: 3 days

## 2012-08-09 NOTE — Progress Notes (Signed)
Palliative Medicine Team SW Pt discussed in PMT rounds, referred for psychosocial support. Met with pt at bedside, who was at first very agitated and reluctant to speak with me about her emotional coping. Pt stated "you can't take away the fears; this is just something I have to do on my own". Pt became very upset when discussing her lack of confidence in relying on family and friends for emotional support. Pt eventually opened up about her love of cooking and the sense of loss she feels having lost much of her independence and ability to "distract" herself from "depression" as she has often done in the past. Discussed small things that pt can control; asked RN to assist with helping pt get her hair combed and have some lotion so that she can feel a bit more like herself. Had to end visit for pt to go to H/D, but will follow up.   Kennieth Francois, LCSW PMT phone [phone] Pager [phone]

## 2012-08-09 NOTE — Progress Notes (Signed)
ANTIBIOTIC CONSULT NOTE - Follow Up  Pharmacy Consult for ceftazidime and vancomycin Indication: diskitis/osteomyelitis  No Known Allergies  Patient Measurements: Height: 4\' 10"  (147.3 cm) Weight: 125 lb 10.6 oz (57 kg) IBW/kg (Calculated) : 40.9   Vital Signs: Temp: 98.3 F (36.8 C) (07/21 1324) Temp src: Oral (07/21 1324) BP: 137/84 mmHg (07/21 1430) Pulse Rate: 79 (07/21 1430) Intake/Output from previous day: 07/20 0701 - 07/21 0700 In: 200 [P.O.:200] Out: -  Intake/Output from this shift: Total I/O In: 240 [P.O.:240] Out: -   Labs:  Recent Labs  08/07/12 0400 08/08/12 1226 08/09/12 1307 08/09/12 1308  WBC 3.4* 3.8* 3.5*  --   HGB 10.3* 10.0* 9.8*  --   PLT 189 172 217  --   CREATININE 4.22* 6.49*  --  7.65*   Estimated Creatinine Clearance: 4.7 ml/min (by C-G formula based on Cr of 7.65).   Microbiology: Recent Results (from the past 720 hour(s))  MRSA PCR SCREENING     Status: None   Collection Time    08/07/12  3:35 AM      Result Value Range Status   MRSA by PCR NEGATIVE  NEGATIVE Final   Comment:            The GeneXpert MRSA Assay (FDA     approved for NASAL specimens     only), is one component of a     comprehensive MRSA colonization     surveillance program. It is not     intended to diagnose MRSA     infection nor to guide or     monitor treatment for     MRSA infections.  BODY FLUID CULTURE     Status: None   Collection Time    08/07/12 11:23 AM      Result Value Range Status   Specimen Description FLUID  T10,T11 DISC ASPIRATE   Final   Special Requests NONE   Final   Gram Stain     Final   Value: WBC PRESENT,BOTH PMN AND MONONUCLEAR     NO ORGANISMS SEEN   Culture NO GROWTH 2 DAYS   Final   Report Status PENDING   Incomplete  CULTURE, BLOOD (ROUTINE X 2)     Status: None   Collection Time    08/07/12  4:53 PM      Result Value Range Status   Specimen Description BLOOD RIGHT ARM   Final   Special Requests BOTTLES DRAWN AEROBIC  ONLY 3CC   Final   Culture  Setup Time 08/08/2012 15:02   Final   Culture     Final   Value:        BLOOD CULTURE RECEIVED NO GROWTH TO DATE CULTURE WILL BE HELD FOR 5 DAYS BEFORE ISSUING A FINAL NEGATIVE REPORT   Report Status PENDING   Incomplete  CULTURE, BLOOD (ROUTINE X 2)     Status: None   Collection Time    08/07/12  5:54 PM      Result Value Range Status   Specimen Description BLOOD RIGHT WRIST   Final   Special Requests BOTTLES DRAWN AEROBIC ONLY 3CC   Final   Culture  Setup Time 08/08/2012 15:02   Final   Culture     Final   Value:        BLOOD CULTURE RECEIVED NO GROWTH TO DATE CULTURE WILL BE HELD FOR 5 DAYS BEFORE ISSUING A FINAL NEGATIVE REPORT   Report Status PENDING   Incomplete  Medical History: Past Medical History  Diagnosis Date  . Renal disorder   . Hypertension   . GERD (gastroesophageal reflux disease)   . Arthritis   . Renal failure   . CVA (cerebral infarction)     Left side weakness and facial droop  . Carpal tunnel syndrome   . Secondary hyperparathyroidism (of renal origin)     Assessment: 68 YOF ESRD on HD TTS admitted with diskitis/osteomyelitis. Originally stated on vancomycin and cefepime, now changed to vancomycin and ceftazidime. Will need to be on antibiotics for 6 weeks. She is usually a TTS schedule for HD, but is receiving HD today for fluid. Cultures are all NGTD, WBC 3.5 and pt afebrile   Goal of Therapy:  pre-HD vanc level of 15-20mcg/mL  Plan:  1. Vancomycin 500mg  IV qHD (order entered for today's dose) 2. Ceftazidime 2g IV qHD (order entered for today's dose) 3. Will follow LOT, cultures/sensitvities and HD scheduled  Koran Seabrook D. Gessica Jawad, PharmD Clinical Pharmacist Pager: [phone] 08/09/2012 2:57 PM

## 2012-08-09 NOTE — Progress Notes (Signed)
PT Cancellation Note  Patient Details Name: Julia Small MRN: [id number] DOB: 1935-05-03   Cancelled Treatment:    Reason Eval/Treat Not Completed: Fatigue/lethargy limiting ability to participate   Helios Kohlmann,KATHrine E 08/09/2012, 11:30 AM Zenovia Jarred, PT, DPT 08/09/2012 Pager: [phone]

## 2012-08-09 NOTE — Progress Notes (Signed)
  Echocardiogram 2D Echocardiogram has been performed.  Julia Small FRANCES 08/09/2012, 12:49 PM

## 2012-08-09 NOTE — Procedures (Signed)
I was present at this session.  I have reviewed the session itself and made appropriate changes.  HD via LUA avf, bp ok.  Julia Small L 7/21/20141:15 PM

## 2012-08-09 NOTE — Progress Notes (Signed)
Patient  Refuses  Heparin  Sub q.

## 2012-08-09 NOTE — Consult Note (Signed)
Patient Julia Small      DOB: 1935-05-12      WUJ:[id number]     Consult Note from the Palliative Medicine Team at [facility]    Consult Requested by: Dr Malachi Bonds     PCP: No primary provider on file. Reason for Consultation:Goals of Care    Phone Number:None  Assessment of patients Current state: Patient alert, sitting up in bed eating breakfast (appartite good/able to feed self), makes eye contact, verbal responses appropriate to questions pertinent to medical interventions. States pain in lower back has improved, currently pain is rated 0/10, aggravated by movement and ambulation. Defines pain as a throbbing/stabbing in quality.   Reviewed chart, spoke with staff caring for patient, then proceeded to have PMT discussion with patient.  Per patient she re-located 5 years ago from [place], she stated she regrets the decision and made it without thinking it through, and now she is financially unable to move back to [place]. Admits to being on dialysis for past 14 years, does not drive. She relies on community based transportation services for transportation to dialysis, appointments and to store.    Stated she has a son, Julia Small that lives in [place], and a daughter, Julia Small in [place], 3 sisters who live in [place], [place], a brother in [place] (no family phone numbers except for son in chart, patient did not want to share or have them called). Emphasized multiple times she does not want any family involved in health care decision discussions. Gave me permission to call her son, Julia Small to inform him of what is being treated during this hospital admission. Patient admits has been weak and fatigued over past week prior to admission, and has had difficulty caring for self at home due to severe arthritic deformity and pain in hands/fingers. We discussed the possibility of discharge to SNF for rehabilitation until she regains strength, and back pain is under control, she  seemed to agree with that plan.  We also discussed patients wishes pertinent to resuscitation, level of medical treatment desired, including medical interventions such as blood transfusions. She stated she is a Jehovah Witness, and would not want blood transfusions, but would want all other inteventions as indicated at this time.   During our conversation patient appeared to have "flight of ideas", and could not fully complete entire conversation on one topic, kept changing the focus of conversation and reverting to prior family issues and concerns. Unsure if there is an underlying psychiatric disorder.     Called patients son, Julia Small [phone]), left message for him to call PMT phone [phone], would like to get family perspective on patients mental and physical capabilities.   Spoke with Dr Malachi Bonds regarding above conversation with patient.  Goals of Care: 1.  Code Status: Full   Continue current level of medical intervention and plan, wants to continue dialysis treatments   2. Scope of Treatment: 1. Vital Signs: routine  2. Respiratory/Oxygen: support as indicated 3. Nutritional Support/Tube Feeds: Yes 4. Antibiotics: as indicated  5. Blood Products: No (Jehovah Witness) 6. IVF: as indicated 7. Review of Medications to be discontinued: None 8. Labs: continue 9. Telemetry: as indicated   4. Disposition: pending, would recommend disposition to SNF for rehabilitation.    3. Symptom Management:   1. Anxiety/Agitation: Seroquel 50 mg daily at bedtime 2. Pain: Fentanyl patch increased to 25 mcg-pain improved, as needed Oxycodone IR 10 mg every 4 hours as needed (10 mg/24 hours).  3. Bowel Regimen: Miralax as needed, Lactulose 30 GM as needed  4. Psychosocial: Per patient she lives alone,per patient she  has minimal social and family support.  5. Spiritual: declined spiritual intervention   Patient Documents Completed or Given: Document Given Completed  Advanced  Directives Pkt    MOST  Will f/u not completed at this time  DNR    Gone from [facility]    Hard Choices      Brief HPI: Patient is a 77 yo AAF, with PMH: ESRD, HTN, prior CVA. She was admitted from her home 08/06/12 due to back pain, nausea, and generalized weakness, found to have diskitis/osetomyelitis of T 10-11 vertebrae, also noted to have Left Kidney lesion via ultrasound.    ROS: + lower back pain,     PMH:  Past Medical History  Diagnosis Date  . Renal disorder   . Hypertension   . GERD (gastroesophageal reflux disease)   . Arthritis   . Renal failure   . CVA (cerebral infarction)     Left side weakness and facial droop  . Carpal tunnel syndrome   . Secondary hyperparathyroidism (of renal origin)      PSH: Past Surgical History  Procedure Laterality Date  . Ankle reconstruction    . Cesarean section      three  . Sp av dialysis shunt access existing *l* Left   . Abdominal hysterectomy     I have reviewed the FH and SH and  If appropriate update it with new information. No Known Allergies Scheduled Meds: . calcium acetate  2,001 mg Oral TID WC  . capsaicin   Topical BID  . ceFEPime (MAXIPIME) IV  2 g Intravenous Once  . darbepoetin (ARANESP) injection - DIALYSIS  60 mcg Intravenous Q Fri-HD  . [START ON 08/11/2012] fentaNYL  25 mcg Transdermal Q72H  . [START ON 08/12/2012] ferric gluconate (FERRLECIT/NULECIT) IV  125 mg Intravenous Q Thu-HD  . heparin  5,000 Units Subcutaneous Q8H  . multivitamin  1 tablet Oral Q supper  . pantoprazole  80 mg Oral Q1200  . QUEtiapine  50 mg Oral QHS  . rOPINIRole  0.5 mg Oral Daily  . sodium chloride  3 mL Intravenous Q12H  . vancomycin (VANCOCIN) 500 mg IVPB  500 mg Intravenous Once   Continuous Infusions:  PRN Meds:.antiseptic oral rinse, doxercalciferol, lactulose, oxyCODONE, polyethylene glycol    BP 126/66  Pulse 71  Temp(Src) 98.8 F (37.1 C) (Oral)  Resp 18  Ht 4\' 10"  (1.473 m)  Wt 57 kg (125 lb 10.6 oz)   BMI 26.27 kg/m2  SpO2 100%   PPS: 40-50%           7/2//14 Albumin 2.7   Intake/Output Summary (Last 24 hours) at 08/09/12 1001 Last data filed at 08/09/12 0900  Gross per 24 hour  Intake    240 ml  Output      0 ml  Net    240 ml   NWG:NFAOZ to admission 7/18                      Physical Exam:  General: Alert, oriented HEENT:  Buccal mucosa moist Chest:  CTA bilaterally CVS: RRR Abdomen:soft, non-tender, BS audible Ext: no pedal edema Neuro: oriented, but appears to have flight of ideas, responds appropriately to decisions pertinent to medical interventions desired.  Labs: CBC    Component Value Date/Time   WBC 3.8* 08/08/2012 1226   RBC 3.39* 08/08/2012  1226   HGB 10.0* 08/08/2012 1226   HCT 30.7* 08/08/2012 1226   PLT 172 08/08/2012 1226   MCV 90.6 08/08/2012 1226   MCH 29.5 08/08/2012 1226   MCHC 32.6 08/08/2012 1226   RDW 14.6 08/08/2012 1226    BMET    Component Value Date/Time   NA 134* 08/08/2012 1226   K 4.0 08/08/2012 1226   CL 95* 08/08/2012 1226   CO2 24 08/08/2012 1226   GLUCOSE 103* 08/08/2012 1226   BUN 39* 08/08/2012 1226   CREATININE 6.49* 08/08/2012 1226   CALCIUM 10.0 08/08/2012 1226   GFRNONAA 6* 08/08/2012 1226   GFRAA 6* 08/08/2012 1226    CMP     Component Value Date/Time   NA 134* 08/08/2012 1226   K 4.0 08/08/2012 1226   CL 95* 08/08/2012 1226   CO2 24 08/08/2012 1226   GLUCOSE 103* 08/08/2012 1226   BUN 39* 08/08/2012 1226   CREATININE 6.49* 08/08/2012 1226   CALCIUM 10.0 08/08/2012 1226   PROT 7.2 08/07/2012 0400   ALBUMIN 2.7* 08/08/2012 1226   AST 13 08/07/2012 0400   ALT 23 08/07/2012 0400   ALKPHOS 81 08/07/2012 0400   BILITOT 0.3 08/07/2012 0400   GFRNONAA 6* 08/08/2012 1226   GFRAA 6* 08/08/2012 1226    Chest Xray Reviewed/Impressions:08/06/12 IMPRESSION:  No substantial change. Cardiomegaly with vascular congestion and  bilateral lower lung patchy atelectasis or infiltrate.   CT scan Abd/Pelvis 08/06/12 IMPRESSION:  1. Findings  worrisome for acute diskitis osteomyelitis involving  the T10 - T11 intervertebral disc space. Further evaluation with  MRI may be performed as clinically indicated.  2. Grossly unchanged sequela of prior presumed areas of diskitis  osteomyelitis involving the T9 - T10 and L4 - L5 intervertebral  disc space levels.  3. Cardiomegaly.  4. Indeterminate approximately 1.7 cm left-sided hypoattenuating  lesion, incompletely evaluated on this noncontrast examination but  may represent a renal cyst. Further evaluation with non emergent  renal ultrasound may be performed as clinically indicated.  Greater than 50%  of this time was spent counseling and coordinating care related to the above assessment and plan.   Time In Time Out Total Time Spent with Patient Total Overall Time  8:30a 9:45a 75 min 85 min    Julia Small, Julia Small Palliative Medicine Team [facility] Team Phone: [phone] Pager: [phone]

## 2012-08-09 NOTE — Progress Notes (Signed)
[facility] Progress Note  Subjective:   Very tearful and hopeless. Back pain improving some. Wants a bath but hesitant to ask for help.  Objective Filed Vitals:   08/08/12 0957 08/08/12 1502 08/08/12 1816 08/08/12 2023  BP: 108/65 112/58 121/60 118/69  Pulse: 70 74 73 72  Temp: 98.3 F (36.8 C) 98.9 F (37.2 C) 98.6 F (37 C) 99 F (37.2 C)  TempSrc: Oral Oral Oral Oral  Resp: 18 18 18 18   Height:    4\' 10"  (1.473 m)  Weight:    57 kg (125 lb 10.6 oz)  SpO2: 100% 100% 100% 100%   Physical Exam General: Depressed, crying, chronically ill-appearing, NAD Heart: RRR Lungs: RRR. + murmur Abdomen: soft, NT, normal BS Extremities: Trace LE edema Dialysis Access: LUA AVF + bruit   Dialysis Orders: Center: [facility] on TTS.  EDW 60.5 HD Bath 2K/2Ca Time 3:30 Heparin NONE. Access LUA AVF BFR 400 DFR 600  Hectorol 4 mcg IV/HD Epogen 1800 Units IV/HD Venofer 50 mg q week  Labs: Hgb 11.9, Tsat 36%, Ferritin 764 in April, Phos 8.4, PTH 959.6, Ca 9   Assessment/Plan: 1. 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 Vanc and cefepime started 7/19 after aspiration; Tmax 99. ESR 65 CRP 8.8 . (note CT shows grossly unchanged sequela of prior presumed areas of diskitis osteomyelitis involving the T9 - T10 and L4 - L5 intervertebral disc space levels)= what pt said she had about 10 years ago while living in [place]. Bone biopsy and blood cx NGTD. Echo pending to rule out endocarditis as source. 2. Left kidney lesion - Internal area of septation on RUS ?cyst vs malignancy per primary. Rad recs pre/post contrast CT. 3. ESRD/Hyperkalemia - TTS hx noncompliance with HD; K corrected with dialysis Friday (4 7/19) - had HD on off day - plan next HD Monday; on NO heparin HD however, it is noted that she is on SQ heparin will use tight hep 4. Hypertension/volume - BP controlled. Home amlodipine, coreg and clonidine held for now. Net UF 2.7L Friday - looks like post HD weight was about 58.6 - probably  can use less meds with lower volume- will need new lower edw at d/c  5. Anemia - Hgb stable 10.0 - Aranesp 60, weeklyl low dose IV Fe 6. Metabolic bone disease - . ^Phos and PTH op. Reportedly has had no binders for 3 weeks due to cost. Continue hectorol, phoslo and low Ca bath 7. Nutrition - Alb 2.7. Renal diet when appropriate. Multivitamin, 8. HX CVA - left side deficit 9. Carpal Tunnel syndrome - Has had op eval and been approved for surgery. Procedure cancelled due to cost. 10. Hx dialysis and medical noncompliance, choices - appreciate input from Palliative team. Goals of care meeting planned for today . Needs counseling on finances, choices  Scot Jun. Thad Ranger [facility] Pager [phone] 08/09/2012,9:22 AM  LOS: 3 days  I have seen and examined this patient and agree with the plan of care seen , eval , examined and patient counseled on plans. .  Lakeva Hollon L 08/09/2012, 10:03 AM    Additional Objective Labs: Basic Metabolic Panel:  Recent Labs Lab 08/06/12 1312 08/06/12 2004 08/07/12 0400 08/08/12 1226  NA 132* 131* 140 134*  K 5.8* 5.7* 4.0 4.0  CL 91* 90* 99 95*  CO2 22 23 28 24   GLUCOSE 92 79 68* 103*  BUN 81* 84* 23 39*  CREATININE 9.45* 9.61* 4.22* 6.49*  CALCIUM 9.7 9.3 9.4  10.0  PHOS  --  8.3*  --  5.6*   Liver Function Tests:  Recent Labs Lab 08/06/12 1200 08/06/12 2004 08/07/12 0400 08/08/12 1226  AST 16  --  13  --   ALT 29  --  23  --   ALKPHOS 86  --  81  --   BILITOT 0.3  --  0.3  --   PROT 7.6  --  7.2  --   ALBUMIN 2.9* 2.8* 2.9* 2.7*   No results found for this basename: LIPASE, AMYLASE,  in the last 168 hours CBC:  Recent Labs Lab 08/06/12 1312 08/07/12 0400 08/08/12 1226  WBC 3.5* 3.4* 3.8*  HGB 10.3* 10.3* 10.0*  HCT 31.2* 31.4* 30.7*  MCV 88.4 89.7 90.6  PLT PLATELET CLUMPS NOTED ON SMEAR, UNABLE TO ESTIMATE 189 172   Blood Culture    Component Value Date/Time   SDES FLUID  T10,T11 DISC ASPIRATE  08/07/2012 1123   SPECREQUEST NONE 08/07/2012 1123   CULT NO GROWTH 1 DAY 08/07/2012 1123   REPTSTATUS PENDING 08/07/2012 1123    Cardiac Enzymes: No results found for this basename: CKTOTAL, CKMB, CKMBINDEX, TROPONINI,  in the last 168 hours CBG:  Recent Labs Lab 08/07/12 1658  GLUCAP 111*   Iron Studies: No results found for this basename: IRON, TIBC, TRANSFERRIN, FERRITIN,  in the last 72 hours @lablastinr3 @ Studies/Results: US Renal  08/08/2012   *RADIOLOGY REPORT*  Clinical Data:  .  Evaluate renal cysts.  RENAL/URINARY TRACT ULTRASOUND COMPLETE  Comparison:  None.  Findings:  Right Kidney:  The right kidney is echogenic and atrophic measuring 7.4 cm.  There are multiple cysts within the upper and lower pole. The largest is in the upper pole measuring 1.9 x 1.8 x 1.5 cm.  Left Kidney:  Left kidney is atrophic and echogenic measuring 8.1 cm.  There are several cysts within the upper pole of the left kidney.  The  Renal sinus cyst is complicated by internal area of septation. This measures 1.9 x 1.7 x 1.8 cm. No evidence of mass or hydronephrosis.  Bladder:  Not visualize.  IMPRESSION:  1.  Bilateral atrophic and echogenic kidneys. 2.  Bilateral renal cysts. 3.  Within the upper pole of the left kidney there is a renal sinus cyst which is complicated by an internal area of septation.  This may be better assessed with contrast enhanced cross-sectional imaging.  If the patient is on the long-term dialysis then the best option may be a pre and postcontrast CT of the kidneys.  If the CT contrast material is contraindicated then the next best study would be a noncontrast MRI.   Original Report Authenticated By: Signa Kell, M.D.   Ir Lumbar Disc Aspiration W/img Guide  08/07/2012   *RADIOLOGY REPORT*  CT ASPIRATION  Date: 08/07/2012  Clinical History: 77 year old female with osteomyelitis diskitis of the T10-T11 interspace.  CT guided aspiration is requested to facilitate passage of identification  prior to initiation of intravenous antibiotics.  Procedures Performed: 1. CT guided aspiration of the T10-T11 disc space  Interventional Radiologist:  Sterling Big, MD  Sedation: Moderate (conscious) sedation was used.  2.5 mg Versed, 100 mcg Fentanyl were administered intravenously.  The patient's vital signs were monitored continuously by radiology nursing throughout the procedure.  Sedation Time: 30 minutes  Fluoroscopy time: 3 minutes  Contrast volume: None  PROCEDURE/FINDINGS:   Informed consent was obtained from the patient following explanation of the procedure, risks, benefits and alternatives. The  patient understands, agrees and consents for the procedure. All questions were addressed. A time out was performed.  Maximal barrier sterile technique utilized including caps, mask, sterile gowns, sterile gloves, large sterile drape, hand hygiene, and betadine skin prep.  A planning axial CT scan was performed.  The T10-T11 disc space was successfully identified.  A suitable skin entry site was selected and marked.  Using snap shot CT guidance, an 18 gauge trocar needle was carefully advanced from an oblique approach into the T10-T11 disc space.  Aspiration and agitation of the disc space new approximately 1 ml of bloody fluid.  This was dilated and a small amount of saline within the aspiration syringe.  The needle was removed.  Post aspiration axial CT imaging demonstrates no evidence of immediate complication or hemorrhage.  The patient tolerated the procedure well and was returned to her room in stable condition.  IMPRESSION:  Technically successful CT-guided aspiration of the T10-T11 disc space yielding approximately 1 ml of bloody fluid which is dilated and a small amount of saline and sent to the lab for culture and sensitivities.  Signed,  Sterling Big, MD Vascular & Interventional Radiologist [facility]   Original Report Authenticated By: Malachy Moan, M.D.   Medications:    . calcium acetate  2,001 mg Oral TID WC  . capsaicin   Topical BID  . ceFEPime (MAXIPIME) IV  2 g Intravenous Once  . darbepoetin (ARANESP) injection - DIALYSIS  60 mcg Intravenous Q Fri-HD  . [START ON 08/11/2012] fentaNYL  25 mcg Transdermal Q72H  . [START ON 08/12/2012] ferric gluconate (FERRLECIT/NULECIT) IV  125 mg Intravenous Q Thu-HD  . heparin  5,000 Units Subcutaneous Q8H  . multivitamin  1 tablet Oral Q supper  . pantoprazole  80 mg Oral Q1200  . QUEtiapine  50 mg Oral QHS  . rOPINIRole  0.5 mg Oral Daily  . sodium chloride  3 mL Intravenous Q12H  . vancomycin (VANCOCIN) 500 mg IVPB  500 mg Intravenous Once

## 2012-08-09 NOTE — Progress Notes (Signed)
Patient Julia Small      DOB: February 02, 1935      WUJ:[id number]  Received call back from patients son, [name]. With prior permission from patient I informed him of patients current status. Also discussed the possibility of investigating mothers desire to have him designated as HPOA in event she is unable to make decisions. He stated he would be coming to the hospital this evening and will speak to her about it. Informed him he could contact PMT as needed.  Freddie Breech, CNS-C Palliative Medicine Team [facility] Team Phone: [phone] Pager: [phone]

## 2012-08-10 DIAGNOSIS — Z515 Encounter for palliative care: Secondary | ICD-10-CM

## 2012-08-10 DIAGNOSIS — R5381 Other malaise: Secondary | ICD-10-CM

## 2012-08-10 DIAGNOSIS — M549 Dorsalgia, unspecified: Secondary | ICD-10-CM

## 2012-08-10 LAB — CBC
MCV: 92.2 fL (ref 78.0–100.0)
Platelets: 174 10*3/uL (ref 150–400)
RBC: 3.32 MIL/uL — ABNORMAL LOW (ref 3.87–5.11)
WBC: 3.2 10*3/uL — ABNORMAL LOW (ref 4.0–10.5)

## 2012-08-10 LAB — RENAL FUNCTION PANEL
Albumin: 2.5 g/dL — ABNORMAL LOW (ref 3.5–5.2)
BUN: 13 mg/dL (ref 6–23)
Chloride: 99 mEq/L (ref 96–112)
GFR calc non Af Amer: 10 mL/min — ABNORMAL LOW (ref >90)
Potassium: 4 mEq/L (ref 3.5–5.1)

## 2012-08-10 MED ORDER — VANCOMYCIN HCL 500 MG IV SOLR: 500.0000 mg | INTRAVENOUS | Status: DC

## 2012-08-10 MED ORDER — CAPSAICIN 0.025 % EX CREA: TOPICAL_CREAM | Freq: Two times a day (BID) | CUTANEOUS | Status: DC

## 2012-08-10 MED ORDER — DEXTROSE 5 % IV SOLN: 2.0000 g | INTRAVENOUS | Status: DC

## 2012-08-10 MED ORDER — FENTANYL 25 MCG/HR TD PT72: 1.0000 | MEDICATED_PATCH | TRANSDERMAL | Status: DC

## 2012-08-10 MED ORDER — NA FERRIC GLUC CPLX IN SUCROSE 12.5 MG/ML IV SOLN
125.0000 mg | INTRAVENOUS | Status: DC
  Administered 2012-08-11: 125 mg via INTRAVENOUS
  Filled 2012-08-10 (×2): qty 10

## 2012-08-10 MED ORDER — NEPRO/CARBSTEADY PO LIQD: 237.0000 mL | Freq: Two times a day (BID) | ORAL | Status: DC

## 2012-08-10 MED ORDER — HYDROCODONE-ACETAMINOPHEN 7.5-325 MG PO TABS: 1.0000 | ORAL_TABLET | Freq: Three times a day (TID) | ORAL | Status: DC | PRN

## 2012-08-10 MED ORDER — QUETIAPINE FUMARATE 50 MG PO TABS: 50.0000 mg | ORAL_TABLET | Freq: Every day | ORAL | Status: DC

## 2012-08-10 MED ORDER — CALCIUM ACETATE 667 MG PO CAPS: 2001.0000 mg | ORAL_CAPSULE | Freq: Three times a day (TID) | ORAL | Status: DC

## 2012-08-10 NOTE — Clinical Social Work Note (Signed)
Patient assessed today for SNF placement for ST rehab and facility chosen - [facility]. Facility contacted and can take patient. PASARR number needed however before patient can discharge and number has not yet been received. Patient advised that [facility] can take her for rehab. MD and charge nurse notified of information needed before patient can discharge.  Genelle Bal, MSW, LCSW [phone]

## 2012-08-10 NOTE — Progress Notes (Signed)
[facility] Progress Note  Subjective:  Less tearful today. "I guess I feel better"   Objective Filed Vitals:   08/09/12 1730 08/09/12 1743 08/09/12 2127 08/10/12 0550  BP: 133/83 162/90 120/69 127/58  Pulse: 87 86 78 73  Temp:  99.1 F (37.3 C) 99.4 F (37.4 C) 98.7 F (37.1 C)  TempSrc:  Oral Oral Oral  Resp: 18 18 18 18   Height:   4\' 10"  (1.473 m)   Weight:   57 kg (125 lb 10.6 oz)   SpO2:  97% 99% 97%   Physical Exam General: Alert, chronically ill-appearing, depressed mood, NAD Heart: RRR Coarse Gr 2/6 sem, also cont M goes away with comp of avf Lungs: Grossly clear. No wheezes, rales, rhonchi decreased bs Abdomen: soft, NT, normal BS liver down 2 cm Extremities: No LE edema. Some swelling to bilateral hands ?chronic Dialysis Access: LUA AVF +bruit  Dialysis Orders: Center: [facility].  EDW 60.5 HD Bath 2K/2Ca Time 3:30 Heparin NONE. Access LUA AVF BFR 400 DFR 600  Hectorol 4 mcg IV/HD Epogen 1800 Units IV/HD Venofer 50 mg q week  Labs: Hgb 11.9, Tsat 36%, Ferritin 764 in April, Phos 8.4, PTH 959.6, Ca 9   Assessment/Plan: 1. 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 Vanc and cefepime started 7/19 after aspiration; Tmax 99.4 ESR 65 CRP 8.8 . (note CT shows grossly unchanged sequela of prior presumed areas of diskitis osteomyelitis involving the T9 - T10 and L4 - L5 intervertebral disc space levels)= what pt said she had about 10 years ago while living in [place]. Bone biopsy and blood cx NGTD. 2D Echo on 7/21 shows EF 60-65% and heavy aortic valve calcification that could represent vegetation, confirm with TEE if indicated clinically. 2. Left kidney lesion - Internal area of septation on RUS. CT with and without contrast shows complex benign cysts. 3. ESRD/Hyperkalemia - TTS but dialyzing off schedule on MWF here. Hx noncompliance with HD; Hyperkalemia corrected with HD.- Currently on NO heparin HD as op due to prolonged bleeding post tx. Next HD 7/23.    4. Hypertension/volume - BP controlled. Home amlodipine, coreg and clonidine held for now. Net UF 2L Mon with post wgt ~57kg - will probably require fewer meds with lower volume- Adjust edw at d/c Also longer slower ufr 5. Anemia - Hgb trending down - Aranesp 60, weekly low dose IV Fe. Follow CBC 6. Metabolic bone disease - . Phos improving. Ca 10.5 corrected. Reportedly had no binders for 3 weeks due to cost. Continue hectorol, phoslo and low Ca bath 7. Nutrition - Alb 2.5. Renal diet. Multivitamin, 8. HX CVA - left side deficit 9. Carpal Tunnel syndrome - Bilateral, L >R. Has had op eval and been approved for surgery. Procedure cancelled due to cost. Declined further eval here per primary. On analgesia. 10. Hx dialysis and medical noncompliance, choices - appreciate input from Palliative team. Needs counseling on finances, choices. Social work on Theatre manager. Would consider psych consult if pt agreeable.  Scot Jun. Broadus John, PA-C [facility] Pager [phone] 08/10/2012,10:13 AM  LOS: 4 days  I have seen and examined this patient and agree with the plan of care seen,eval, examined and discussed. Will do HD, to go to [facility] .  Simona Rocque L 08/10/2012, 12:40 PM    Additional Objective Labs: Basic Metabolic Panel:  Recent Labs Lab 08/08/12 1226 08/09/12 1308 08/10/12 0500  NA 134* 135 137  K 4.0 4.2 4.0  CL 95* 95* 99  CO2 24  24 29  GLUCOSE 103* 122* 86  BUN 39* 47* 13  CREATININE 6.49* 7.65* 4.00*  CALCIUM 10.0 10.0 9.3  PHOS 5.6* 5.1* 3.4   Liver Function Tests:  Recent Labs Lab 08/06/12 1200  08/07/12 0400 08/08/12 1226 08/09/12 1308 08/10/12 0500  AST 16  --  13  --   --   --   ALT 29  --  23  --   --   --   ALKPHOS 86  --  81  --   --   --   BILITOT 0.3  --  0.3  --   --   --   PROT 7.6  --  7.2  --   --   --   ALBUMIN 2.9*  < > 2.9* 2.7* 2.8* 2.5*  < > = values in this interval not displayed. No results found for this basename: LIPASE, AMYLASE,  in the  last 168 hours CBC:  Recent Labs Lab 08/06/12 1312 08/07/12 0400 08/08/12 1226 08/09/12 1307 08/10/12 0500  WBC 3.5* 3.4* 3.8* 3.5* 3.2*  HGB 10.3* 10.3* 10.0* 9.8* 9.7*  HCT 31.2* 31.4* 30.7* 31.2* 30.6*  MCV 88.4 89.7 90.6 92.0 92.2  PLT PLATELET CLUMPS NOTED ON SMEAR, UNABLE TO ESTIMATE 189 172 217 174   Blood Culture    Component Value Date/Time   SDES BLOOD RIGHT WRIST 08/07/2012 1754   SPECREQUEST BOTTLES DRAWN AEROBIC ONLY 3CC 08/07/2012 1754   CULT        BLOOD CULTURE RECEIVED NO GROWTH TO DATE CULTURE WILL BE HELD FOR 5 DAYS BEFORE ISSUING A FINAL NEGATIVE REPORT 08/07/2012 1754   REPTSTATUS PENDING 08/07/2012 1754    CBG:  Recent Labs Lab 08/07/12 1658  GLUCAP 111*   Studies/Results: US Renal  08/08/2012   *RADIOLOGY REPORT*  Clinical Data:  .  Evaluate renal cysts.  RENAL/URINARY TRACT ULTRASOUND COMPLETE  Comparison:  None.  Findings:  Right Kidney:  The right kidney is echogenic and atrophic measuring 7.4 cm.  There are multiple cysts within the upper and lower pole. The largest is in the upper pole measuring 1.9 x 1.8 x 1.5 cm.  Left Kidney:  Left kidney is atrophic and echogenic measuring 8.1 cm.  There are several cysts within the upper pole of the left kidney.  The  Renal sinus cyst is complicated by internal area of septation. This measures 1.9 x 1.7 x 1.8 cm. No evidence of mass or hydronephrosis.  Bladder:  Not visualize.  IMPRESSION:  1.  Bilateral atrophic and echogenic kidneys. 2.  Bilateral renal cysts. 3.  Within the upper pole of the left kidney there is a renal sinus cyst which is complicated by an internal area of septation.  This may be better assessed with contrast enhanced cross-sectional imaging.  If the patient is on the long-term dialysis then the best option may be a pre and postcontrast CT of the kidneys.  If the CT contrast material is contraindicated then the next best study would be a noncontrast MRI.   Original Report Authenticated By: Signa Kell, M.D.   Ct Abd Wo & W Cm  08/09/2012   *RADIOLOGY REPORT*  Clinical Data: Abnormal left renal ultrasound.  Cyst versus mass. The patient complains of left-sided pain.  History of dialysis for 14 years.  CT ABDOMEN WITHOUT AND WITH CONTRAST  Technique:  Multidetector CT imaging of the abdomen was performed following the standard protocol before and during bolus administration of intravenous contrast.  Contrast: OMNIPAQUE  IOHEXOL 300 MG/ML  SOLN  Comparison: ultrasound renal 08/08/2012.  CT abdomen and pelvis 08/06/2012  Findings: Unenhanced images of the abdomen demonstrate a parenchymal calcification in the left kidney which may represent a small stone or dystrophic calcification.  This appears stable since previous study.  Calcification of the aorta and branch vessels. Surgical sutures in the midline anterior abdominal wall.  Contrast enhanced images:  Respiratory motion artifact limits visualization of the lung fields.  There appears to be atelectasis in the lung bases with patchy infiltration in the right lung base. Cardiac enlargement.  Small esophageal hiatal hernia.  The kidneys are atrophic bilaterally. Delayed renal nephrograms bilaterally although there is some concentration of contrast in the renal parenchyma bilaterally. Nephrograms appear symmetrical.  No excretion is demonstrated at the time of delayed images.  Multiple low attenuation lesions are demonstrated throughout both kidneys. There is no definite contrast the cyst in the upper pole of the left kidney which was indeterminate by ultrasound demonstrates no obvious contrast enhancement.  Changes likely represent a benign complex cyst.  No definite solid enhancing lesion is demonstrated in the kidneys.  No hydronephrosis.  The liver, spleen, gallbladder, pancreas, adrenal glands, inferior vena cava, and retroperitoneal lymph nodes are unremarkable. Calcification of the abdominal aorta without aneurysm.  The stomach and small bowel  are decompressed.  Contrast material in the colon without distension.  No free air or free fluid demonstrated in the abdomen.  Loss of disc space height and endplate destruction at T11- 12 suggesting diskitis/osteomyelitis is again demonstrated.  There appears to be mild progressive loss of the disc space since the previous study.  Mild soft tissue swelling in the paraspinal space at this level.  Degenerative changes in the visualized lumbar spine.  Changes of previous diskitis at L4-5 is incompletely evaluated on this study but appears stable.  IMPRESSION: Bilateral renal atrophy with delayed nephrogram consistent with chronic medical renal disease.  Multiple bilateral renal cysts.  No definite contrast enhancement demonstrated in the cysts.  No solid lesion identified in the upper pole of the left kidney.  Previous ultrasound changes likely represent  complex benign cysts.   Original Report Authenticated By: Burman Nieves, M.D.   Medications:   . calcium acetate  2,001 mg Oral TID WC  . capsaicin   Topical BID  . darbepoetin (ARANESP) injection - DIALYSIS  60 mcg Intravenous Q Fri-HD  . [START ON 08/11/2012] fentaNYL  25 mcg Transdermal Q72H  . [START ON 08/12/2012] ferric gluconate (FERRLECIT/NULECIT) IV  125 mg Intravenous Q Thu-HD  . heparin  5,000 Units Subcutaneous Q8H  . multivitamin  1 tablet Oral Q supper  . pantoprazole  80 mg Oral Q1200  . QUEtiapine  50 mg Oral QHS  . rOPINIRole  0.5 mg Oral Daily  . sodium chloride  3 mL Intravenous Q12H

## 2012-08-10 NOTE — Progress Notes (Signed)
Patient Julia Small      DOB: [birth year]      WUJ:[id number]   Palliative Medicine Team at [facility] Progress Note    Subjective: Patient much more attentive, able to stay on track with topics discussed. She was agreeable to talk about present Goals of care, she stated her son did not visit yesterday evening. PMT spoke with son [name] via phone 7/22, recommended consideration to discuss with mother designation of HPOA in event patient would not be able to make decisions. Patient does admit pain in lower back better, since increase in Fentanyl patch, does have episodic increases with movement.  Filed Vitals:   08/10/12 0550  BP: 127/58  Pulse: 73  Temp: 98.7 F (37.1 C)  Resp: 18   Physical exam: General: Alert, verbal responses appropriate HEENT: buccal mucosa moist CHEST: CTA bilaterally CVS: RRR ABD: soft, non-tender, BS audible NEURO: alert/oriented   Assessment and plan: Patient's thought process intact, not scattered as it was yesterday, judgement and reasoning intact. Completed MOST for with patient today, placed on chart she states she is willing to discharge to SNF for rehabilitation with goal to eventually get back home.  Code Status: DNR/DNI  Symptom Management:  1. Anxiety/Agitation: Seroquel 50 mg daily at bedtime 2. Pain: Fentanyl patch increased to 25 mcg-pain improved, as needed Oxycodone IR 10 mg every 4 hours as needed (10 mg/24 hours). 3. Bowel Regimen: Miralax as needed, Lactulose 30 GM as needed  Time In Time Out Total Time Spent with Patient Total Overall Time  11:30a 12 noon 30 min 30 min    Julia Small, CNS-C Palliative Medicine Team [facility] Team Phone: [phone] Pager: [phone]

## 2012-08-10 NOTE — Progress Notes (Signed)
ANTIBIOTIC CONSULT NOTE - Follow Up  Pharmacy Consult for ceftazidime and vancomycin Indication: diskitis/osteomyelitis  No Known Allergies  Patient Measurements: Height: 4\' 10"  (147.3 cm) Weight: 125 lb 10.6 oz (57 kg) IBW/kg (Calculated) : 40.9   Vital Signs: Temp: 98.7 F (37.1 C) (07/22 0550) Temp src: Oral (07/22 0550) BP: 127/58 mmHg (07/22 0550) Pulse Rate: 73 (07/22 0550) Intake/Output from previous day: 07/21 0701 - 07/22 0700 In: 780 [P.O.:780] Out: 2000  Intake/Output from this shift: Total I/O In: 240 [P.O.:240] Out: -   Labs:  Recent Labs  08/08/12 1226 08/09/12 1307 08/09/12 1308 08/10/12 0500  WBC 3.8* 3.5*  --  3.2*  HGB 10.0* 9.8*  --  9.7*  PLT 172 217  --  174  CREATININE 6.49*  --  7.65* 4.00*   Estimated Creatinine Clearance: 9.1 ml/min (by C-G formula based on Cr of 4).   Microbiology: Recent Results (from the past 720 hour(s))  MRSA PCR SCREENING     Status: None   Collection Time    08/07/12  3:35 AM      Result Value Range Status   MRSA by PCR NEGATIVE  NEGATIVE Final   Comment:            The GeneXpert MRSA Assay (FDA     approved for NASAL specimens     only), is one component of a     comprehensive MRSA colonization     surveillance program. It is not     intended to diagnose MRSA     infection nor to guide or     monitor treatment for     MRSA infections.  BODY FLUID CULTURE     Status: None   Collection Time    08/07/12 11:23 AM      Result Value Range Status   Specimen Description FLUID  T10,T11 DISC ASPIRATE   Final   Special Requests NONE   Final   Gram Stain     Final   Value: WBC PRESENT,BOTH PMN AND MONONUCLEAR     NO ORGANISMS SEEN   Culture NO GROWTH 3 DAYS   Final   Report Status PENDING   Incomplete  CULTURE, BLOOD (ROUTINE X 2)     Status: None   Collection Time    08/07/12  4:53 PM      Result Value Range Status   Specimen Description BLOOD RIGHT ARM   Final   Special Requests BOTTLES DRAWN AEROBIC  ONLY 3CC   Final   Culture  Setup Time 08/08/2012 15:02   Final   Culture     Final   Value:        BLOOD CULTURE RECEIVED NO GROWTH TO DATE CULTURE WILL BE HELD FOR 5 DAYS BEFORE ISSUING A FINAL NEGATIVE REPORT   Report Status PENDING   Incomplete  CULTURE, BLOOD (ROUTINE X 2)     Status: None   Collection Time    08/07/12  5:54 PM      Result Value Range Status   Specimen Description BLOOD RIGHT WRIST   Final   Special Requests BOTTLES DRAWN AEROBIC ONLY 3CC   Final   Culture  Setup Time 08/08/2012 15:02   Final   Culture     Final   Value:        BLOOD CULTURE RECEIVED NO GROWTH TO DATE CULTURE WILL BE HELD FOR 5 DAYS BEFORE ISSUING A FINAL NEGATIVE REPORT   Report Status PENDING   Incomplete  Assessment: 53 YOF ESRD on HD TTS admitted with diskitis/osteomyelitis. Originally stated on vancomycin and cefepime, now on vancomycin and ceftazidime. Will need to be on antibiotics for 6 weeks given at HD center. She is usually a TTS schedule for HD, but did receive HD yesterday (Monday) with no orders entered for today. Cultures are all NGTD, WBC 3.2 and pt afebrile.   Goal of Therapy:  pre-HD vanc level of 15-51mcg/mL  Plan:  1. Vancomycin 500mg  IV qHD TTS  2. Ceftazidime 2g IV qHD TTS 3. Will follow LOT, cultures/sensitvities and HD schedule  Caasi Giglia D. Rada Zegers, PharmD Clinical Pharmacist Pager: [phone] 08/10/2012 1:04 PM

## 2012-08-10 NOTE — Clinical Social Work Placement (Addendum)
Clinical Social Work Department CLINICAL SOCIAL WORK PLACEMENT NOTE 08/10/2012  Patient:  Julia Small, Julia Small  Account Number:  [id number] Admit date:  08/06/2012  Clinical Social Worker:  Genelle Bal, LCSW  Date/time:  08/10/2012 06:03 AM  Clinical Social Work is seeking post-discharge placement for this patient at the following level of care:   SKILLED NURSING   (*CSW will update this form in Epic as items are completed)   08/10/2012  Patient/family provided with [facility] Department of Clinical Social Work's list of facilities offering this level of care within the geographic area requested by the patient (or if unable, by the patient's family).  08/10/2012  Patient/family informed of their freedom to choose among providers that offer the needed level of care, that participate in Medicare, Medicaid or managed care program needed by the patient, have an available bed and are willing to accept the patient.    Patient/family informed of [facility]' ownership interest in [facility], as well as of the fact that they are under no obligation to receive care at this facility.  PASARR submitted to EDS on 08/10/2012 PASARR number received from EDS on 08/11/12 - [phone] [id number]  FL2 transmitted to all facilities in geographic area requested by pt/family on  08/10/2012 FL2 transmitted to all facilities within larger geographic area on   Patient informed that his/her managed care company has contracts with or will negotiate with  certain facilities, including the following:     Patient/family informed of bed offers received:  08/10/2012 Patient chooses bed at [facility] Physician recommends and patient chooses bed at    Patient to be transferred to [facility] on  08/11/2012 Patient to be transferred to facility by ambulance  The following physician request were entered in Epic:  Additional Comments: 08/11/12: PASARR and d/c summary (that was completed  by Dr. Arbutus Leas) sent to facility today.

## 2012-08-10 NOTE — Clinical Social Work Psychosocial (Signed)
Clinical Social Work Department BRIEF PSYCHOSOCIAL ASSESSMENT 08/10/2012  Patient:  Julia Small, Julia Small     Account Number:  [id number]     Admit date:  08/06/2012  Clinical Social Worker:  Delmer Islam  Date/Time:  08/10/2012 05:53 AM  Referred by:  Physician  Date Referred:  08/10/2012 Referred for  SNF Placement   Other Referral:   Interview type:  Patient Other interview type:    PSYCHOSOCIAL DATA Living Status:  ALONE Admitted from facility:   Level of care:   Primary support name:   Primary support relationship to patient:   Degree of support available:   Patient has a granddaughter, and a son and daughter-in-law in [place], however unable to determine level of support from family. Patient stated that her daughter still lives in [place] and that she has 9 sister and brothers living.    CURRENT CONCERNS Current Concerns  Post-Acute Placement   Other Concerns:    SOCIAL WORK ASSESSMENT / PLAN CSW and RN case manager [employer] introduced selves to patient and explained purpose of visit - ST rehab in a facility vs home with home health. Patient reported that she lives alone but does have family in [place]. She goes to dialysis and uses [facility] and [facility] transportation to get to her treatments. Patient gave history of her move from [place] to [place], first to [place] then to [employer].    CSW talked with patient about SNF for ST rehab and patient understand the need for ST rehab as she has no one that can assist her at home. Patient reported that she has been to [facility] but does not know if they will take her back as she owes them money. CSW explained SNF search process and advised that [facility] will be contacted.   Assessment/plan status:  Psychosocial Support/Ongoing Assessment of Needs Other assessment/ plan:   Information/referral to community resources:    PATIENT'S/FAMILY'S RESPONSE TO PLAN OF CARE: Patient willing to go to a facility for ST  rehab.

## 2012-08-10 NOTE — Discharge Summary (Addendum)
Physician Discharge Summary  Shaquella Stamant ZOX:[id number] DOB: Nov 02, 1935 DOA: 08/06/2012  PCP: No primary provider on file.  Admit date: 08/06/2012 Discharge date: 08/10/2012  Recommendations for Outpatient Follow-up:  1. Followup with infectious disease within 4 weeks of discharge for followup of osteomyelitis and discitis 2. Continue regularly scheduled hemodialysis with vancomycin and ceftaz through August 29th.  Please follow up on results of pending biopsy and blood cultures.  If organism speciates, may be able to narrow antibiotics.   3. Ongoing physical, occupational therapy.  Discharge Diagnoses:  Active Problems:   ESRD (end stage renal disease) on dialysis   Osteomyelitis   Diskitis   HTN (hypertension)   GERD (gastroesophageal reflux disease)   Pain in back   Palliative care encounter   Generalized weakness   Unspecified constipation   Bacterial endocarditis   Discharge Condition: Stable, improved  Diet recommendation: Dialysis  Wt Readings from Last 3 Encounters:  08/09/12 57 kg (125 lb 10.6 oz)    History of present illness:   Julia Small is a 77 y.o. female has a past medical history significant for end-stage renal disease on hemodialysis TTS, with her last hemodialysis Saturday, also history of hypertension, previous CVA, GERD presents with a chief complaint of back pain and weakness for the past 5 days. Her complaints are somewhat diffuse, however endorses nausea, generalized weakness with difficulties walking and fatigue. She missed her dialysis sessions this week due to these complaints. She lives in [place]. She denies any fevers, endorses two episodes of chills in the past 2 days. She denies any chest pain, shortness of breath. She denies any abdominal pain. She has a small cough which is somewhat chronic, she's having troubles coughing because every time she tries to cough she has back pain. In the emergency room, patient is afebrile, initially tachycardic  then her heart rate is in the 70s, initial blood pressure 84/52, improved after small 500cc bolus and now consistently > 100 systolic. Lab evaluation showed hyperkalemia at 5.8, BUN 81 , creatinine 9.4, mild leukopenia with a white count of 3.5, and mild anemia. Because of her diffuse GI complaints and back pain, she had a CT scan which showed findings worrisome for acute discitis/osteomyelitis of the T10-11 region. Hospitalist service has been asked to admit patient.   Hospital Course:   Julia Small was admitted with severe low back pain. She underwent MRI of the lumbar spine which demonstrated osteomyelitis and discitis from T10-T11.  She underwent a bone biopsy by interventional radiology and the culture is still no growth to date. Blood cultures have been no growth to date. Her ESR was 65 and her CRP was 8.8. She was started on empiric antibiotics with vancomycin and cefepime. It when her cultures were negative at 48 hours, she was changed to vancomycin and ceftazidime which can be given during dialysis. She will complete a minimum of 6 weeks of antibiotics given at hemodialysis and follow up at the infectious disease clinic in approximately 4-6 weeks. Echocardiogram to rule out a source of bacteremia demonstrated heavy calcification of the AV with moderate AV stenosis that may represent vegetation.  She did not have any valvular insufficiency.  TEE would not change the course or duration of antibiotics.  Discussed case with Dr. Orvan Falconer, Infectious Disease, who agreed that patient should continue current therapy and recommended against TEE at this time.  She did not have any pain at her hemodialysis access site and it worked properly during dialysis, so this was felt to be  less likely the source of infection.  Left kidney lesion, seen initially an MRI. Her renal ultrasound demonstrated a renal sinus cyst with an area of internal septation. Her followup pre-and postcontrast CT demonstrated a benign cyst.  She may follow up with her regular primary care doctor.  End-stage renal disease on hemodialysis on Tuesday, Thursday, and Saturday. She should continue her next hemodialysis on Thursday. She has a history of noncompliance with dialysis and so the Child psychotherapist verified that she would be able to followup with her previous dialysis center.  Hyperkalemia, likely due to inadequate hemodialysis and resolved with hemodialysis.  Hypertension, blood pressures are below normal. I held her blood pressure medications and they can be resumed as an outpatient if needed.  Anemia of renal parenchymal disease. Management per nephrology.  Acid reflux, stable. She continued high-dose PPI.  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 Stable.  Carpal tunnel and chronic pain. She was started on a fentanyl patch at 25 mcg and her pain slowly decreased. She continued as needed Vicodin. She was seen by the palliative care team who increased her fentanyl patch from 12.5 mcg to 25 mcg total and she should not have a dose adjustment for at least one to 2 weeks. Monitor for signs of sedation.  Progressive weakness, subacute. She has minimal assistance at home. She was evaluated by physical therapy recommended skilled nursing facility, however the patient urged [place] to maximize her home support if possible. She states that she does not have the finances to pay for physical and occupational therapy or home health nurse.   Tearful and likely depressed.  Started on Seroquel. She would benefit from an outpatient psychiatric evaluation. I do not start any new medications at this time, however I will provide her with information for behavioral health services.  Consultants:  Nephrology  Radiology Procedures:  CT-guided biopsy on 7/18  Antibiotics:  Vancomycin 7/19 >>  Cefepime 7/19 >> 7/21  Ceftaz 7/21 >>  Discharge Exam: Filed Vitals:   08/10/12 0550  BP: 127/58  Pulse: 73  Temp: 98.7 F  (37.1 C)  Resp: 18   Filed Vitals:   08/09/12 1730 08/09/12 1743 08/09/12 2127 08/10/12 0550  BP: 133/83 162/90 120/69 127/58  Pulse: 87 86 78 73  Temp:  99.1 F (37.3 C) 99.4 F (37.4 C) 98.7 F (37.1 C)  TempSrc:  Oral Oral Oral  Resp: 18 18 18 18   Height:   4\' 10"  (1.473 m)   Weight:   57 kg (125 lb 10.6 oz)   SpO2:  97% 99% 97%    General: Thin African American female,  HEENT: NCAT, MMM  Cardiovascular: RRR, nl S1, S2 radiation of bruit from arm, 2+ pulses, warm extremities  Respiratory: CTAB, no increased WOB  Abdomen: NABS, soft, NT/ND  MSK: Normal tone and bulk, no LEE. Hands are swollen warm and tender.  She continues to have tenderness to palpation along the lumbar back. Neuro: Grossly intact mild facial asymmetry, 3/5 hand grip on the right, possibly secondary to pain   Discharge Instructions      Discharge Orders   Future Orders Complete By Expires     Call MD for:  difficulty breathing, headache or visual disturbances  As directed     Call MD for:  extreme fatigue  As directed     Call MD for:  hives  As directed     Call MD for:  persistant dizziness or light-headedness  As directed  Call MD for:  persistant nausea and vomiting  As directed     Call MD for:  severe uncontrolled pain  As directed     Call MD for:  temperature >100.4  As directed     Diet - low sodium heart healthy  As directed     Comments:      Dialysis    Discharge instructions  As directed     Comments:      You were hospitalized with low back pain and were found to have osteomyelitis and discitis of the thoracic spine. You were started on broad-spectrum antibiotics which will be given to you at hemodialysis for the next 6 weeks. You will follow up with the infectious disease clinic in approximately 4-5 weeks. Please call to schedule this appointment.  Your blood pressures were somewhat low probably because of your underlying infection.  Please stop your Norvasc, carvedilol, and  clonidine.  For your pain you were started on a fentanyl patch and capsaicin cream.  You were also started on Seroquel at night for sleep.  You had a cyst on your left kidney which appears benign.    Increase activity slowly  As directed         Medication List    STOP taking these medications       amLODipine 10 MG tablet  Commonly known as:  NORVASC     carvedilol 6.25 MG tablet  Commonly known as:  COREG     cloNIDine 0.1 MG tablet  Commonly known as:  CATAPRES      TAKE these medications       b complex-vitamin c-folic acid 0.8 MG Tabs  Take 0.8 mg by mouth daily.     calcium acetate 667 MG capsule  Commonly known as:  PHOSLO  Take 3 capsules (2,001 mg total) by mouth 3 (three) times daily with meals.     capsaicin 0.025 % cream  Commonly known as:  ZOSTRIX  Apply topically 2 (two) times daily.     esomeprazole 40 MG capsule  Commonly known as:  NEXIUM  Take 40 mg by mouth daily.     feeding supplement (NEPRO CARB STEADY) Liqd  Take 237 mLs by mouth 2 (two) times daily between meals.     fentaNYL 25 MCG/HR  Commonly known as:  DURAGESIC - dosed mcg/hr  Place 1 patch (25 mcg total) onto the skin every 3 (three) days.  Start taking on:  08/11/2012     HYDROcodone-acetaminophen 7.5-325 MG per tablet  Commonly known as:  NORCO  Take 1 tablet by mouth 3 (three) times daily as needed for pain.     QUEtiapine 50 MG tablet  Commonly known as:  SEROQUEL  Take 1 tablet (50 mg total) by mouth at bedtime.     ranitidine 150 MG tablet  Commonly known as:  ZANTAC  Take 150 mg by mouth 2 (two) times daily.     rOPINIRole 0.5 MG tablet  Commonly known as:  REQUIP  Take 0.5 mg by mouth daily.     zolpidem 10 MG tablet  Commonly known as:  AMBIEN  Take 10 mg by mouth at bedtime as needed for sleep.       Follow-up Information   Follow up with Judyann Munson, MD. Schedule an appointment as soon as possible for a visit in 4 weeks.   Contact information:   [address] [place] [address] [phone]  Follow up with [facility].   Contact information:   [address] [place] [address] [phone]       The results of significant diagnostics from this hospitalization (including imaging, microbiology, ancillary and laboratory) are listed below for reference.    Significant Diagnostic Studies: Ct Abdomen Pelvis Wo Contrast  08/06/2012   *RADIOLOGY REPORT*  Clinical Data: Diffuse abdominal and back pain  CT ABDOMEN AND PELVIS WITHOUT CONTRAST  Technique:  Multidetector CT imaging of the abdomen and pelvis was performed following the standard protocol without intravenous contrast.  Comparison: CT abdomen pelvis - 04/17/2008  Findings:  The lack of intravenous contrast limits the ability to evaluate solid abdominal organs.  There has been interval destruction of the T10 - T11 intervertebral disc space with associated increase sclerosis and irregularity of the adjacent endplates. There is minimal (approximately 5 mm) of herniated disc material towards the spinal canal (axial image 11, series 2) and approximately 25% loss of the T11 vertebral body height.  These findings are associated with development of adjacent paraspinal soft tissue stranding and thickening (images 8 through 13, series 2) and are worrisome for acute diskitis osteomyelitis.  Previously levels of diskitis osteomyelitis at T9 - T10 and L4 - L5 appear grossly unchanged, however there is now near complete bony fusion of the T9 - T10 intervertebral disc space.  Normal hepatic contour.  Normal noncontrast appearance of the decompressed gallbladder.  No ascites.  The bilateral kidneys are atrophic compatible with provided history of end-stage renal disease.  There is a possible 1.6 x 1.9 cm hypoattenuating lesion arising from the superior pole of the left kidney which is incompletely evaluated on this noncontrast examination  (image 23, series 2).  No definite urinary obstruction. No perinephric stranding.  There is mild diffuse thickening of the left adrenal gland without discrete nodule.  Normal noncontrast appearance of the right adrenal gland, pancreas and spleen.  Ingested enteric contrast extends to the level of the distal small bowel.  No evidence of enteric obstruction.  Bowel is normal in course and caliber without discrete area of wall thickening.  No pneumoperitoneum, pneumatosis or portal venous gas.  Atherosclerotic plaque within a tortuous and normal caliber abdominal aorta.  No definite bulky retroperitoneal, mesenteric, pelvic or inguinal lymphadenopathy on this noncontrast examination.  Post hysterectomy.  No discrete adnexal lesion.  No free fluid in the pelvis.  Limited visualization of the lower thorax demonstrates worsening bibasilar opacities.  No definite pleural effusion.  Cardiomegaly.  Calcifications within the mitral valve annulus.  No definite pericardial effusion.  Multiple ventral abdominal wall metallic sutures are redemonstrated.  IMPRESSION: 1.  Findings worrisome for acute diskitis osteomyelitis involving the T10 - T11 intervertebral disc space.  Further evaluation with MRI may be performed as clinically indicated.  2.  Grossly unchanged sequela of prior presumed areas of diskitis osteomyelitis involving the T9 - T10 and L4 - L5 intervertebral disc space levels.  3.  Cardiomegaly.  4.  Indeterminate approximately 1.7 cm left-sided hypoattenuating lesion, incompletely evaluated on this noncontrast examination but may represent a renal cyst.  Further evaluation with non emergent renal ultrasound may be performed as clinically indicated.  Above findings discussed with [place], PA at 1526.   Original Report Authenticated By: Tacey Ruiz, MD   Dg Chest 2 View  08/06/2012   *RADIOLOGY REPORT*  Clinical Data: Cough.  CHEST - 2 VIEW  Comparison: 03/27/2012  Findings: The cardiopericardial silhouette is  enlarged.  Vascular  congestion noted with patchy atelectasis or infiltrate bilaterally, left greater than right. Telemetry leads overlie the chest. Imaged bony structures of the thorax are intact.  IMPRESSION: No substantial change.  Cardiomegaly with vascular congestion and bilateral lower lung patchy atelectasis or infiltrate.   Original Report Authenticated By: Kennith Center, M.D.   US Renal  08/08/2012   *RADIOLOGY REPORT*  Clinical Data:  .  Evaluate renal cysts.  RENAL/URINARY TRACT ULTRASOUND COMPLETE  Comparison:  None.  Findings:  Right Kidney:  The right kidney is echogenic and atrophic measuring 7.4 cm.  There are multiple cysts within the upper and lower pole. The largest is in the upper pole measuring 1.9 x 1.8 x 1.5 cm.  Left Kidney:  Left kidney is atrophic and echogenic measuring 8.1 cm.  There are several cysts within the upper pole of the left kidney.  The  Renal sinus cyst is complicated by internal area of septation. This measures 1.9 x 1.7 x 1.8 cm. No evidence of mass or hydronephrosis.  Bladder:  Not visualize.  IMPRESSION:  1.  Bilateral atrophic and echogenic kidneys. 2.  Bilateral renal cysts. 3.  Within the upper pole of the left kidney there is a renal sinus cyst which is complicated by an internal area of septation.  This may be better assessed with contrast enhanced cross-sectional imaging.  If the patient is on the long-term dialysis then the best option may be a pre and postcontrast CT of the kidneys.  If the CT contrast material is contraindicated then the next best study would be a noncontrast MRI.   Original Report Authenticated By: Signa Kell, M.D.   Ct Abd Wo & W Cm  08/09/2012   *RADIOLOGY REPORT*  Clinical Data: Abnormal left renal ultrasound.  Cyst versus mass. The patient complains of left-sided pain.  History of dialysis for 14 years.  CT ABDOMEN WITHOUT AND WITH CONTRAST  Technique:  Multidetector CT imaging of the abdomen was performed following the standard  protocol before and during bolus administration of intravenous contrast.  Contrast: OMNIPAQUE IOHEXOL 300 MG/ML  SOLN  Comparison: ultrasound renal 08/08/2012.  CT abdomen and pelvis 08/06/2012  Findings: Unenhanced images of the abdomen demonstrate a parenchymal calcification in the left kidney which may represent a small stone or dystrophic calcification.  This appears stable since previous study.  Calcification of the aorta and branch vessels. Surgical sutures in the midline anterior abdominal wall.  Contrast enhanced images:  Respiratory motion artifact limits visualization of the lung fields.  There appears to be atelectasis in the lung bases with patchy infiltration in the right lung base. Cardiac enlargement.  Small esophageal hiatal hernia.  The kidneys are atrophic bilaterally. Delayed renal nephrograms bilaterally although there is some concentration of contrast in the renal parenchyma bilaterally. Nephrograms appear symmetrical.  No excretion is demonstrated at the time of delayed images.  Multiple low attenuation lesions are demonstrated throughout both kidneys. There is no definite contrast the cyst in the upper pole of the left kidney which was indeterminate by ultrasound demonstrates no obvious contrast enhancement.  Changes likely represent a benign complex cyst.  No definite solid enhancing lesion is demonstrated in the kidneys.  No hydronephrosis.  The liver, spleen, gallbladder, pancreas, adrenal glands, inferior vena cava, and retroperitoneal lymph nodes are unremarkable. Calcification of the abdominal aorta without aneurysm.  The stomach and small bowel are decompressed.  Contrast material in the colon without distension.  No free air or free fluid demonstrated in the abdomen.  Loss of disc space height and endplate destruction at T11- 12 suggesting diskitis/osteomyelitis is again demonstrated.  There appears to be mild progressive loss of the disc space since the previous study.  Mild  soft tissue swelling in the paraspinal space at this level.  Degenerative changes in the visualized lumbar spine.  Changes of previous diskitis at L4-5 is incompletely evaluated on this study but appears stable.  IMPRESSION: Bilateral renal atrophy with delayed nephrogram consistent with chronic medical renal disease.  Multiple bilateral renal cysts.  No definite contrast enhancement demonstrated in the cysts.  No solid lesion identified in the upper pole of the left kidney.  Previous ultrasound changes likely represent  complex benign cysts.   Original Report Authenticated By: Burman Nieves, M.D.   Ir Lumbar Disc Aspiration W/img Guide  08/07/2012   *RADIOLOGY REPORT*  CT ASPIRATION  Date: 08/07/2012  Clinical History: 77 year old female with osteomyelitis diskitis of the T10-T11 interspace.  CT guided aspiration is requested to facilitate passage of identification prior to initiation of intravenous antibiotics.  Procedures Performed: 1. CT guided aspiration of the T10-T11 disc space  Interventional Radiologist:  Sterling Big, MD  Sedation: Moderate (conscious) sedation was used.  2.5 mg Versed, 100 mcg Fentanyl were administered intravenously.  The patient's vital signs were monitored continuously by radiology nursing throughout the procedure.  Sedation Time: 30 minutes  Fluoroscopy time: 3 minutes  Contrast volume: None  PROCEDURE/FINDINGS:   Informed consent was obtained from the patient following explanation of the procedure, risks, benefits and alternatives. The patient understands, agrees and consents for the procedure. All questions were addressed. A time out was performed.  Maximal barrier sterile technique utilized including caps, mask, sterile gowns, sterile gloves, large sterile drape, hand hygiene, and betadine skin prep.  A planning axial CT scan was performed.  The T10-T11 disc space was successfully identified.  A suitable skin entry site was selected and marked.  Using snap shot CT  guidance, an 18 gauge trocar needle was carefully advanced from an oblique approach into the T10-T11 disc space.  Aspiration and agitation of the disc space new approximately 1 ml of bloody fluid.  This was dilated and a small amount of saline within the aspiration syringe.  The needle was removed.  Post aspiration axial CT imaging demonstrates no evidence of immediate complication or hemorrhage.  The patient tolerated the procedure well and was returned to her room in stable condition.  IMPRESSION:  Technically successful CT-guided aspiration of the T10-T11 disc space yielding approximately 1 ml of bloody fluid which is dilated and a small amount of saline and sent to the lab for culture and sensitivities.  Signed,  Sterling Big, MD Vascular & Interventional Radiologist [facility] Radiology   Original Report Authenticated By: Malachy Moan, M.D.    Microbiology: Recent Results (from the past 240 hour(s))  MRSA PCR SCREENING     Status: None   Collection Time    08/07/12  3:35 AM      Result Value Range Status   MRSA by PCR NEGATIVE  NEGATIVE Final   Comment:            The GeneXpert MRSA Assay (FDA     approved for NASAL specimens     only), is one component of a     comprehensive MRSA colonization     surveillance program. It is not     intended to diagnose MRSA     infection nor to guide or  monitor treatment for     MRSA infections.  BODY FLUID CULTURE     Status: None   Collection Time    08/07/12 11:23 AM      Result Value Range Status   Specimen Description FLUID  T10,T11 DISC ASPIRATE   Final   Special Requests NONE   Final   Gram Stain     Final   Value: WBC PRESENT,BOTH PMN AND MONONUCLEAR     NO ORGANISMS SEEN   Culture NO GROWTH 3 DAYS   Final   Report Status PENDING   Incomplete  CULTURE, BLOOD (ROUTINE X 2)     Status: None   Collection Time    08/07/12  4:53 PM      Result Value Range Status   Specimen Description BLOOD RIGHT ARM   Final   Special  Requests BOTTLES DRAWN AEROBIC ONLY 3CC   Final   Culture  Setup Time 08/08/2012 15:02   Final   Culture     Final   Value:        BLOOD CULTURE RECEIVED NO GROWTH TO DATE CULTURE WILL BE HELD FOR 5 DAYS BEFORE ISSUING A FINAL NEGATIVE REPORT   Report Status PENDING   Incomplete  CULTURE, BLOOD (ROUTINE X 2)     Status: None   Collection Time    08/07/12  5:54 PM      Result Value Range Status   Specimen Description BLOOD RIGHT WRIST   Final   Special Requests BOTTLES DRAWN AEROBIC ONLY 3CC   Final   Culture  Setup Time 08/08/2012 15:02   Final   Culture     Final   Value:        BLOOD CULTURE RECEIVED NO GROWTH TO DATE CULTURE WILL BE HELD FOR 5 DAYS BEFORE ISSUING A FINAL NEGATIVE REPORT   Report Status PENDING   Incomplete     Labs: Basic Metabolic Panel:  Recent Labs Lab 08/06/12 1312 08/06/12 2004 08/07/12 0400 08/08/12 1226 08/09/12 1308 08/10/12 0500  NA 132* 131* 140 134* 135 137  K 5.8* 5.7* 4.0 4.0 4.2 4.0  CL 91* 90* 99 95* 95* 99  CO2 22 23 28 24 24 29   GLUCOSE 92 79 68* 103* 122* 86  BUN 81* 84* 23 39* 47* 13  CREATININE 9.45* 9.61* 4.22* 6.49* 7.65* 4.00*  CALCIUM 9.7 9.3 9.4 10.0 10.0 9.3  PHOS  --  8.3*  --  5.6* 5.1* 3.4   Liver Function Tests:  Recent Labs Lab 08/06/12 1200 08/06/12 2004 08/07/12 0400 08/08/12 1226 08/09/12 1308 08/10/12 0500  AST 16  --  13  --   --   --   ALT 29  --  23  --   --   --   ALKPHOS 86  --  81  --   --   --   BILITOT 0.3  --  0.3  --   --   --   PROT 7.6  --  7.2  --   --   --   ALBUMIN 2.9* 2.8* 2.9* 2.7* 2.8* 2.5*   No results found for this basename: LIPASE, AMYLASE,  in the last 168 hours No results found for this basename: AMMONIA,  in the last 168 hours CBC:  Recent Labs Lab 08/06/12 1312 08/07/12 0400 08/08/12 1226 08/09/12 1307 08/10/12 0500  WBC 3.5* 3.4* 3.8* 3.5* 3.2*  HGB 10.3* 10.3* 10.0* 9.8* 9.7*  HCT 31.2* 31.4* 30.7* 31.2* 30.6*  MCV 88.4  89.7 90.6 92.0 92.2  PLT PLATELET CLUMPS  NOTED ON SMEAR, UNABLE TO ESTIMATE 189 172 217 174   Cardiac Enzymes: No results found for this basename: CKTOTAL, CKMB, CKMBINDEX, TROPONINI,  in the last 168 hours BNP: BNP (last 3 results) No results found for this basename: PROBNP,  in the last 8760 hours CBG:  Recent Labs Lab 08/07/12 1658  GLUCAP 111*    Time coordinating discharge: 45 minutes  Signed:  Milyn Stapleton  [facility] 08/10/2012, 1:15 PM

## 2012-08-11 DIAGNOSIS — I330 Acute and subacute infective endocarditis: Secondary | ICD-10-CM

## 2012-08-11 LAB — RENAL FUNCTION PANEL
CO2: 28 mEq/L (ref 19–32)
Calcium: 10.5 mg/dL (ref 8.4–10.5)
Creatinine, Ser: 6.14 mg/dL — ABNORMAL HIGH (ref 0.50–1.10)
GFR calc Af Amer: 7 mL/min — ABNORMAL LOW (ref >90)
GFR calc non Af Amer: 6 mL/min — ABNORMAL LOW (ref >90)
Phosphorus: 3.8 mg/dL (ref 2.3–4.6)
Sodium: 133 mEq/L — ABNORMAL LOW (ref 135–145)

## 2012-08-11 LAB — CBC
HCT: 31.1 % — ABNORMAL LOW (ref 36.0–46.0)
Hemoglobin: 9.4 g/dL — ABNORMAL LOW (ref 12.0–15.0)
MCH: 28.1 pg (ref 26.0–34.0)
MCHC: 30.2 g/dL (ref 30.0–36.0)
RDW: 15.1 % (ref 11.5–15.5)

## 2012-08-11 LAB — BODY FLUID CULTURE

## 2012-08-11 MED ORDER — ONDANSETRON HCL 4 MG/2ML IJ SOLN
4.0000 mg | Freq: Once | INTRAMUSCULAR | Status: AC
  Administered 2012-08-11: 4 mg via INTRAVENOUS

## 2012-08-11 MED ORDER — VANCOMYCIN HCL 500 MG IV SOLR
500.0000 mg | Freq: Once | INTRAVENOUS | Status: AC
  Administered 2012-08-11: 500 mg via INTRAVENOUS
  Filled 2012-08-11: qty 500

## 2012-08-11 MED ORDER — DEXTROSE 5 % IV SOLN
2.0000 g | Freq: Once | INTRAVENOUS | Status: AC
  Administered 2012-08-11: 2 g via INTRAVENOUS
  Filled 2012-08-11: qty 2

## 2012-08-11 NOTE — Progress Notes (Signed)
PT Cancellation Note  Patient Details Name: Julia Small MRN: [id number] DOB: [birth year]   Cancelled Treatment:    Reason Eval/Treat Not Completed: Fatigue/lethargy limiting ability to participate;Pain limiting ability to participate Pt's room very warm upon entering.  Pt reports not feeling well.  Adjusted temp and left door open at pt's request.   Brigitte Soderberg,KATHrine E 08/11/2012, 2:10 PM Zenovia Jarred, PT, DPT 08/11/2012 Pager: [phone]

## 2012-08-11 NOTE — Progress Notes (Signed)
Patient ZO:XWRUEAV Koc      DOB: 06-10-1935      WUJ:[id number]   Palliative Medicine Team at [facility] Progress Note    Subjective: Patient sitting up in bed, eating lunch. Alert, thoughts clear, verbal responses appropriate  Filed Vitals:   08/11/12 1104  BP: 140/84  Pulse: 88  Temp:   Resp:    Physical exam: General: Appears comfortable   HEENT: buccal mucosa moist  CHEST: CTA bilaterally  CVS: RRR  ABD: soft, non-tender, BS audible  NEURO: alert/orient  Assessment and plan: Patient with ESRD getting dialysis for past 14 years (per patient). MOST form completed yesterday, placed on chart with DNR [place] form.  1. Anxiety/Agitation: Seroquel 50 mg daily at bedtime 2. Pain: Fentanyl patch increased to 25 mcg-pain improved, as needed Oxycodone IR 10 mg every 4 hours as needed (10 mg/24 hours). 3. Bowel Regimen: Miralax as needed, Lactulose 30 GM as needed 4. Plan: Discharge to [facility] for rehabilitation-then patient hopeful to return home.  Time In Time Out Total Time Spent with Patient Total Overall Time  1:00p 1:15p 15 min 15 min   Freddie Breech, CNS-C Palliative Medicine Team [facility] Team Phone: [phone] Pager: [phone]

## 2012-08-11 NOTE — Procedures (Signed)
I was present at this dialysis session. I have reviewed the session itself and made appropriate changes.   Vinson Moselle  MD Pager [phone]    Cell  [phone] 08/11/2012, 7:32 AM

## 2012-08-11 NOTE — Discharge Summary (Signed)
Physician Discharge Summary  Julia Small WUJ:[id number] DOB: 10-27-1935 DOA: 08/06/2012  PCP: No primary provider on file.  Admit date: 08/06/2012 Discharge date: 08/11/2012  Recommendations for Outpatient Follow-up:  1. Followup with infectious disease within 4 weeks of discharge for followup of osteomyelitis and discitis 2. Continue regularly scheduled hemodialysis with vancomycin and ceftaz through August 29th. Patient currently receiving vancomycin 500 mg with each dialysis, ceftazidime 2 g with each dialysis with the last planned dose 09/17/2012.  Please follow up on results of pending biopsy and blood cultures.  If organism speciates, may be able to narrow antibiotics.   3. Ongoing physical, occupational therapy. 4. Followup Dr. Jonny Ruiz Campbell/Cynthia Drue Second in 4 weeks  Discharge Diagnoses:  Active Problems:   ESRD (end stage renal disease) on dialysis   Osteomyelitis   Diskitis   HTN (hypertension)   GERD (gastroesophageal reflux disease)   Pain in back   Palliative care encounter   Generalized weakness   Unspecified constipation   Bacterial endocarditis   Discharge Condition: Stable, improved  Diet recommendation: Dialysis  Wt Readings from Last 3 Encounters:  08/11/12 61.5 kg (135 lb 9.3 oz)    History of present illness:   Julia Small is a 77 y.o. female has a past medical history significant for end-stage renal disease on hemodialysis TTS, with her last hemodialysis Saturday, also history of hypertension, previous CVA, GERD presents with a chief complaint of back pain and weakness for the past 5 days. Her complaints are somewhat diffuse, however endorses nausea, generalized weakness with difficulties walking and fatigue. She missed her dialysis sessions this week due to these complaints. She lives in [place]. She denies any fevers, endorses two episodes of chills in the past 2 days. She denies any chest pain, shortness of breath. She denies any abdominal pain. She  has a small cough which is somewhat chronic, she's having troubles coughing because every time she tries to cough she has back pain. In the emergency room, patient is afebrile, initially tachycardic then her heart rate is in the 70s, initial blood pressure 84/52, improved after small 500cc bolus and now consistently > 100 systolic. Lab evaluation showed hyperkalemia at 5.8, BUN 81 , creatinine 9.4, mild leukopenia with a white count of 3.5, and mild anemia. Because of her diffuse GI complaints and back pain, she had a CT scan which showed findings worrisome for acute discitis/osteomyelitis of the T10-11 region. Hospitalist service has been asked to admit patient.   Hospital Course:   Ms. Julia Small was admitted with severe low back pain. She underwent MRI of the lumbar spine which demonstrated osteomyelitis and discitis from T10-T11.  She underwent a bone biopsy by interventional radiology and the culture is still no growth to date. Blood cultures have been no growth to date. Her ESR was 65 and her CRP was 8.8. She was started on empiric antibiotics with vancomycin and cefepime. It when her cultures were negative at 48 hours, she was changed to vancomycin and ceftazidime which can be given during dialysis. She will complete a minimum of 6 weeks of antibiotics given at hemodialysis and follow up at the infectious disease clinic in approximately 4-6 weeks. Echocardiogram to rule out a source of bacteremia demonstrated heavy calcification of the AV with moderate AV stenosis that may represent vegetation.  She did not have any valvular insufficiency.  TEE would not change the course or duration of antibiotics.  Discussed case with Dr. Orvan Falconer, Infectious Disease, who agreed that patient should continue current therapy and  recommended against TEE at this time.  She did not have any pain at her hemodialysis access site and it worked properly during dialysis, so this was felt to be less likely the source of  infection.Patient currently receiving vancomycin 500 mg with each dialysis, ceftazidime 2 g with each dialysis with the last planned dose 09/17/2012. The nephrology service was notified of the patient's discharge to coordinate outpatient dialysis as well as outpatient antibiotics received on dialysis  Left kidney lesion, seen initially an MRI. Her renal ultrasound demonstrated a renal sinus cyst with an area of internal septation. Her followup pre-and postcontrast CT demonstrated a benign cyst. She may follow up with her regular primary care doctor.  End-stage renal disease on hemodialysis on Tuesday, Thursday, and Saturday. She should continue her next hemodialysis on Thursday. She has a history of noncompliance with dialysis and so the Child psychotherapist verified that she would be able to followup with her previous dialysis center.  Hyperkalemia, likely due to inadequate hemodialysis and resolved with hemodialysis.  Hypertension, blood pressures are below normal. I held her blood pressure medications and they can be resumed as an outpatient if needed.  Anemia of renal parenchymal disease. Management per nephrology.  Acid reflux, stable. She continued high-dose PPI.  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 Stable.  Carpal tunnel and chronic pain. She was started on a fentanyl patch at 25 mcg and her pain slowly decreased. She continued as needed Vicodin. She was seen by the palliative care team who increased her fentanyl patch from 12.5 mcg to 25 mcg total and she should not have a dose adjustment for at least one to 2 weeks. Monitor for signs of sedation.  Progressive weakness, subacute. She has minimal assistance at home. She was evaluated by physical therapy recommended skilled nursing facility, however the patient urged [place] to maximize her home support if possible. She states that she does not have the finances to pay for physical and occupational therapy or home health  nurse.   Tearful and likely depressed.  Started on Seroquel. She would benefit from an outpatient psychiatric evaluation. I do not start any new medications at this time, however I will provide her with information for behavioral health services.  Consultants:  Nephrology  Radiology Procedures:  CT-guided biopsy on 7/18  Antibiotics:  Vancomycin 7/19 >>  Cefepime 7/19 >> 7/21  Ceftaz 7/21 >>  Discharge Exam: Filed Vitals:   08/11/12 1104  BP: 140/84  Pulse: 88  Temp:   Resp:    Filed Vitals:   08/11/12 1000 08/11/12 1030 08/11/12 1048 08/11/12 1104  BP: 100/57 112/63 109/70 140/84  Pulse: 84 85 84 88  Temp:   98 F (36.7 C)   TempSrc:   Oral   Resp:   18   Height:      Weight:      SpO2:   99%     General: Thin African American female,  HEENT: NCAT, no icterus, no thrush Cardiovascular: RRR, nl S1, S2 radiation of bruit from arm, 2+ pulses, warm extremities  Respiratory: CTAB, no increased WOB  Abdomen: NABS, soft, NT/ND  MSK: Normal tone and bulk, no LEE. Hands are swollen warm and tender.  She continues to have tenderness to palpation along the lumbar back. Neuro: Grossly intact mild facial asymmetry   Discharge Instructions      Discharge Orders   Future Orders Complete By Expires     Call MD for:  difficulty breathing, headache or  visual disturbances  As directed     Call MD for:  extreme fatigue  As directed     Call MD for:  hives  As directed     Call MD for:  persistant dizziness or light-headedness  As directed     Call MD for:  persistant nausea and vomiting  As directed     Call MD for:  severe uncontrolled pain  As directed     Call MD for:  temperature >100.4  As directed     Diet - low sodium heart healthy  As directed     Comments:      Dialysis    Discharge instructions  As directed     Comments:      You were hospitalized with low back pain and were found to have osteomyelitis and discitis of the thoracic spine. You were started on  broad-spectrum antibiotics which will be given to you at hemodialysis for the next 6 weeks. You will follow up with the infectious disease clinic in approximately 4-5 weeks. Please call to schedule this appointment.  Your blood pressures were somewhat low probably because of your underlying infection.  Please stop your Norvasc, carvedilol, and clonidine.  For your pain you were started on a fentanyl patch and capsaicin cream.  You were also started on Seroquel at night for sleep.  You had a cyst on your left kidney which appears benign.    Increase activity slowly  As directed         Medication List    STOP taking these medications       amLODipine 10 MG tablet  Commonly known as:  NORVASC     carvedilol 6.25 MG tablet  Commonly known as:  COREG     cloNIDine 0.1 MG tablet  Commonly known as:  CATAPRES      TAKE these medications       b complex-vitamin c-folic acid 0.8 MG Tabs  Take 0.8 mg by mouth daily.     calcium acetate 667 MG capsule  Commonly known as:  PHOSLO  Take 3 capsules (2,001 mg total) by mouth 3 (three) times daily with meals.     capsaicin 0.025 % cream  Commonly known as:  ZOSTRIX  Apply topically 2 (two) times daily.     esomeprazole 40 MG capsule  Commonly known as:  NEXIUM  Take 40 mg by mouth daily.     feeding supplement (NEPRO CARB STEADY) Liqd  Take 237 mLs by mouth 2 (two) times daily between meals.     fentaNYL 25 MCG/HR  Commonly known as:  DURAGESIC - dosed mcg/hr  Place 1 patch (25 mcg total) onto the skin every 3 (three) days.     HYDROcodone-acetaminophen 7.5-325 MG per tablet  Commonly known as:  NORCO  Take 1 tablet by mouth 3 (three) times daily as needed for pain.     QUEtiapine 50 MG tablet  Commonly known as:  SEROQUEL  Take 1 tablet (50 mg total) by mouth at bedtime.     ranitidine 150 MG tablet  Commonly known as:  ZANTAC  Take 150 mg by mouth 2 (two) times daily.     rOPINIRole 0.5 MG tablet  Commonly known as:  REQUIP   Take 0.5 mg by mouth daily.     zolpidem 10 MG tablet  Commonly known as:  AMBIEN  Take 10 mg by mouth at bedtime as needed for sleep.       Follow-up  Information   Follow up with Judyann Munson, MD. Schedule an appointment as soon as possible for a visit in 4 weeks.   Contact information:   [address] [place] [address] [phone]       Follow up with [facility].   Contact information:   [address] [place] [address] [phone]       The results of significant diagnostics from this hospitalization (including imaging, microbiology, ancillary and laboratory) are listed below for reference.    Significant Diagnostic Studies: Ct Abdomen Pelvis Wo Contrast  08/06/2012   *RADIOLOGY REPORT*  Clinical Data: Diffuse abdominal and back pain  CT ABDOMEN AND PELVIS WITHOUT CONTRAST  Technique:  Multidetector CT imaging of the abdomen and pelvis was performed following the standard protocol without intravenous contrast.  Comparison: CT abdomen pelvis - 04/17/2008  Findings:  The lack of intravenous contrast limits the ability to evaluate solid abdominal organs.  There has been interval destruction of the T10 - T11 intervertebral disc space with associated increase sclerosis and irregularity of the adjacent endplates. There is minimal (approximately 5 mm) of herniated disc material towards the spinal canal (axial image 11, series 2) and approximately 25% loss of the T11 vertebral body height.  These findings are associated with development of adjacent paraspinal soft tissue stranding and thickening (images 8 through 13, series 2) and are worrisome for acute diskitis osteomyelitis.  Previously levels of diskitis osteomyelitis at T9 - T10 and L4 - L5 appear grossly unchanged, however there is now near complete bony fusion of the T9 - T10 intervertebral disc space.  Normal hepatic contour.  Normal noncontrast appearance of  the decompressed gallbladder.  No ascites.  The bilateral kidneys are atrophic compatible with provided history of end-stage renal disease.  There is a possible 1.6 x 1.9 cm hypoattenuating lesion arising from the superior pole of the left kidney which is incompletely evaluated on this noncontrast examination (image 23, series 2).  No definite urinary obstruction. No perinephric stranding.  There is mild diffuse thickening of the left adrenal gland without discrete nodule.  Normal noncontrast appearance of the right adrenal gland, pancreas and spleen.  Ingested enteric contrast extends to the level of the distal small bowel.  No evidence of enteric obstruction.  Bowel is normal in course and caliber without discrete area of wall thickening.  No pneumoperitoneum, pneumatosis or portal venous gas.  Atherosclerotic plaque within a tortuous and normal caliber abdominal aorta.  No definite bulky retroperitoneal, mesenteric, pelvic or inguinal lymphadenopathy on this noncontrast examination.  Post hysterectomy.  No discrete adnexal lesion.  No free fluid in the pelvis.  Limited visualization of the lower thorax demonstrates worsening bibasilar opacities.  No definite pleural effusion.  Cardiomegaly.  Calcifications within the mitral valve annulus.  No definite pericardial effusion.  Multiple ventral abdominal wall metallic sutures are redemonstrated.  IMPRESSION: 1.  Findings worrisome for acute diskitis osteomyelitis involving the T10 - T11 intervertebral disc space.  Further evaluation with MRI may be performed as clinically indicated.  2.  Grossly unchanged sequela of prior presumed areas of diskitis osteomyelitis involving the T9 - T10 and L4 - L5 intervertebral disc space levels.  3.  Cardiomegaly.  4.  Indeterminate approximately 1.7 cm left-sided hypoattenuating lesion, incompletely evaluated on this noncontrast examination but may represent a renal cyst.  Further evaluation with non emergent renal ultrasound may  be performed as clinically indicated.  Above findings discussed with Emmit Alexanders, PA at  1526.   Original Report Authenticated By: Tacey Ruiz, MD   Dg Chest 2 View  08/06/2012   *RADIOLOGY REPORT*  Clinical Data: Cough.  CHEST - 2 VIEW  Comparison: 03/27/2012  Findings: The cardiopericardial silhouette is enlarged.  Vascular congestion noted with patchy atelectasis or infiltrate bilaterally, left greater than right. Telemetry leads overlie the chest. Imaged bony structures of the thorax are intact.  IMPRESSION: No substantial change.  Cardiomegaly with vascular congestion and bilateral lower lung patchy atelectasis or infiltrate.   Original Report Authenticated By: Kennith Center, M.D.   US Renal  08/08/2012   *RADIOLOGY REPORT*  Clinical Data:  .  Evaluate renal cysts.  RENAL/URINARY TRACT ULTRASOUND COMPLETE  Comparison:  None.  Findings:  Right Kidney:  The right kidney is echogenic and atrophic measuring 7.4 cm.  There are multiple cysts within the upper and lower pole. The largest is in the upper pole measuring 1.9 x 1.8 x 1.5 cm.  Left Kidney:  Left kidney is atrophic and echogenic measuring 8.1 cm.  There are several cysts within the upper pole of the left kidney.  The  Renal sinus cyst is complicated by internal area of septation. This measures 1.9 x 1.7 x 1.8 cm. No evidence of mass or hydronephrosis.  Bladder:  Not visualize.  IMPRESSION:  1.  Bilateral atrophic and echogenic kidneys. 2.  Bilateral renal cysts. 3.  Within the upper pole of the left kidney there is a renal sinus cyst which is complicated by an internal area of septation.  This may be better assessed with contrast enhanced cross-sectional imaging.  If the patient is on the long-term dialysis then the best option may be a pre and postcontrast CT of the kidneys.  If the CT contrast material is contraindicated then the next best study would be a noncontrast MRI.   Original Report Authenticated By: Signa Kell, M.D.   Ct Abd Wo & W  Cm  08/09/2012   *RADIOLOGY REPORT*  Clinical Data: Abnormal left renal ultrasound.  Cyst versus mass. The patient complains of left-sided pain.  History of dialysis for 14 years.  CT ABDOMEN WITHOUT AND WITH CONTRAST  Technique:  Multidetector CT imaging of the abdomen was performed following the standard protocol before and during bolus administration of intravenous contrast.  Contrast: OMNIPAQUE IOHEXOL 300 MG/ML  SOLN  Comparison: ultrasound renal 08/08/2012.  CT abdomen and pelvis 08/06/2012  Findings: Unenhanced images of the abdomen demonstrate a parenchymal calcification in the left kidney which may represent a small stone or dystrophic calcification.  This appears stable since previous study.  Calcification of the aorta and branch vessels. Surgical sutures in the midline anterior abdominal wall.  Contrast enhanced images:  Respiratory motion artifact limits visualization of the lung fields.  There appears to be atelectasis in the lung bases with patchy infiltration in the right lung base. Cardiac enlargement.  Small esophageal hiatal hernia.  The kidneys are atrophic bilaterally. Delayed renal nephrograms bilaterally although there is some concentration of contrast in the renal parenchyma bilaterally. Nephrograms appear symmetrical.  No excretion is demonstrated at the time of delayed images.  Multiple low attenuation lesions are demonstrated throughout both kidneys. There is no definite contrast the cyst in the upper pole of the left kidney which was indeterminate by ultrasound demonstrates no obvious contrast enhancement.  Changes likely represent a benign complex cyst.  No definite solid enhancing lesion is demonstrated in the kidneys.  No hydronephrosis.  The liver, spleen, gallbladder, pancreas, adrenal glands,  inferior vena cava, and retroperitoneal lymph nodes are unremarkable. Calcification of the abdominal aorta without aneurysm.  The stomach and small bowel are decompressed.  Contrast  material in the colon without distension.  No free air or free fluid demonstrated in the abdomen.  Loss of disc space height and endplate destruction at T11- 12 suggesting diskitis/osteomyelitis is again demonstrated.  There appears to be mild progressive loss of the disc space since the previous study.  Mild soft tissue swelling in the paraspinal space at this level.  Degenerative changes in the visualized lumbar spine.  Changes of previous diskitis at L4-5 is incompletely evaluated on this study but appears stable.  IMPRESSION: Bilateral renal atrophy with delayed nephrogram consistent with chronic medical renal disease.  Multiple bilateral renal cysts.  No definite contrast enhancement demonstrated in the cysts.  No solid lesion identified in the upper pole of the left kidney.  Previous ultrasound changes likely represent  complex benign cysts.   Original Report Authenticated By: Burman Nieves, M.D.   Ir Lumbar Disc Aspiration W/img Guide  08/07/2012   *RADIOLOGY REPORT*  CT ASPIRATION  Date: 08/07/2012  Clinical History: 77 year old female with osteomyelitis diskitis of the T10-T11 interspace.  CT guided aspiration is requested to facilitate passage of identification prior to initiation of intravenous antibiotics.  Procedures Performed: 1. CT guided aspiration of the T10-T11 disc space  Interventional Radiologist:  Sterling Big, MD  Sedation: Moderate (conscious) sedation was used.  2.5 mg Versed, 100 mcg Fentanyl were administered intravenously.  The patient's vital signs were monitored continuously by radiology nursing throughout the procedure.  Sedation Time: 30 minutes  Fluoroscopy time: 3 minutes  Contrast volume: None  PROCEDURE/FINDINGS:   Informed consent was obtained from the patient following explanation of the procedure, risks, benefits and alternatives. The patient understands, agrees and consents for the procedure. All questions were addressed. A time out was performed.  Maximal barrier  sterile technique utilized including caps, mask, sterile gowns, sterile gloves, large sterile drape, hand hygiene, and betadine skin prep.  A planning axial CT scan was performed.  The T10-T11 disc space was successfully identified.  A suitable skin entry site was selected and marked.  Using snap shot CT guidance, an 18 gauge trocar needle was carefully advanced from an oblique approach into the T10-T11 disc space.  Aspiration and agitation of the disc space new approximately 1 ml of bloody fluid.  This was dilated and a small amount of saline within the aspiration syringe.  The needle was removed.  Post aspiration axial CT imaging demonstrates no evidence of immediate complication or hemorrhage.  The patient tolerated the procedure well and was returned to her room in stable condition.  IMPRESSION:  Technically successful CT-guided aspiration of the T10-T11 disc space yielding approximately 1 ml of bloody fluid which is dilated and a small amount of saline and sent to the lab for culture and sensitivities.  Signed,  Sterling Big, MD Vascular & Interventional Radiologist [facility]   Original Report Authenticated By: Malachy Moan, M.D.    Microbiology: Recent Results (from the past 240 hour(s))  MRSA PCR SCREENING     Status: None   Collection Time    08/07/12  3:35 AM      Result Value Range Status   MRSA by PCR NEGATIVE  NEGATIVE Final   Comment:            The GeneXpert MRSA Assay (FDA     approved for NASAL specimens  only), is one component of a     comprehensive MRSA colonization     surveillance program. It is not     intended to diagnose MRSA     infection nor to guide or     monitor treatment for     MRSA infections.  BODY FLUID CULTURE     Status: None   Collection Time    08/07/12 11:23 AM      Result Value Range Status   Specimen Description FLUID  T10,T11 DISC ASPIRATE   Final   Special Requests NONE   Final   Gram Stain     Final   Value: WBC  PRESENT,BOTH PMN AND MONONUCLEAR     NO ORGANISMS SEEN   Culture NO GROWTH 3 DAYS   Final   Report Status PENDING   Incomplete  CULTURE, BLOOD (ROUTINE X 2)     Status: None   Collection Time    08/07/12  4:53 PM      Result Value Range Status   Specimen Description BLOOD RIGHT ARM   Final   Special Requests BOTTLES DRAWN AEROBIC ONLY 3CC   Final   Culture  Setup Time 08/08/2012 15:02   Final   Culture     Final   Value:        BLOOD CULTURE RECEIVED NO GROWTH TO DATE CULTURE WILL BE HELD FOR 5 DAYS BEFORE ISSUING A FINAL NEGATIVE REPORT   Report Status PENDING   Incomplete  CULTURE, BLOOD (ROUTINE X 2)     Status: None   Collection Time    08/07/12  5:54 PM      Result Value Range Status   Specimen Description BLOOD RIGHT WRIST   Final   Special Requests BOTTLES DRAWN AEROBIC ONLY 3CC   Final   Culture  Setup Time 08/08/2012 15:02   Final   Culture     Final   Value:        BLOOD CULTURE RECEIVED NO GROWTH TO DATE CULTURE WILL BE HELD FOR 5 DAYS BEFORE ISSUING A FINAL NEGATIVE REPORT   Report Status PENDING   Incomplete     Labs: Basic Metabolic Panel:  Recent Labs Lab 08/06/12 2004 08/07/12 0400 08/08/12 1226 08/09/12 1308 08/10/12 0500 08/11/12 0640  NA 131* 140 134* 135 137 133*  K 5.7* 4.0 4.0 4.2 4.0 4.0  CL 90* 99 95* 95* 99 95*  CO2 23 28 24 24 29 28   GLUCOSE 79 68* 103* 122* 86 83  BUN 84* 23 39* 47* 13 23  CREATININE 9.61* 4.22* 6.49* 7.65* 4.00* 6.14*  CALCIUM 9.3 9.4 10.0 10.0 9.3 10.5  PHOS 8.3*  --  5.6* 5.1* 3.4 3.8   Liver Function Tests:  Recent Labs Lab 08/06/12 1200  08/07/12 0400 08/08/12 1226 08/09/12 1308 08/10/12 0500 08/11/12 0640  AST 16  --  13  --   --   --   --   ALT 29  --  23  --   --   --   --   ALKPHOS 86  --  81  --   --   --   --   BILITOT 0.3  --  0.3  --   --   --   --   PROT 7.6  --  7.2  --   --   --   --   ALBUMIN 2.9*  < > 2.9* 2.7* 2.8* 2.5* 2.6*  < > = values in this interval  not displayed. No results found for  this basename: LIPASE, AMYLASE,  in the last 168 hours No results found for this basename: AMMONIA,  in the last 168 hours CBC:  Recent Labs Lab 08/07/12 0400 08/08/12 1226 08/09/12 1307 08/10/12 0500 08/11/12 0640  WBC 3.4* 3.8* 3.5* 3.2* 3.4*  HGB 10.3* 10.0* 9.8* 9.7* 9.4*  HCT 31.4* 30.7* 31.2* 30.6* 31.1*  MCV 89.7 90.6 92.0 92.2 93.1  PLT 189 172 217 174 210   Cardiac Enzymes: No results found for this basename: CKTOTAL, CKMB, CKMBINDEX, TROPONINI,  in the last 168 hours BNP: BNP (last 3 results) No results found for this basename: PROBNP,  in the last 8760 hours CBG:  Recent Labs Lab 08/07/12 1658  GLUCAP 111*    Time coordinating discharge: 45 minutes  Signed:  Arlette Schaad  [facility] 08/11/2012, 1:33 PM

## 2012-08-11 NOTE — Progress Notes (Signed)
[facility] Progress Note  Subjective:  Less tearful today. "I guess I feel better"   Objective Filed Vitals:   08/11/12 0428 08/11/12 0634 08/11/12 0645 08/11/12 0700  BP: 145/76 190/109 157/71 169/104  Pulse: 84 81 83 92  Temp: 98.6 F (37 C) 97.8 F (36.6 C)    TempSrc: Oral Oral    Resp: 17 16    Height:      Weight:  61.5 kg (135 lb 9.3 oz)    SpO2: 99% 99%     Physical Exam General: Alert, chronically ill-appearing, depressed mood, NAD Heart: RRR Coarse Gr 2/6 sem, also cont M goes away with comp of avf Lungs: Grossly clear. No wheezes, rales, rhonchi decreased bs Abdomen: soft, NT, normal BS liver down 2 cm Extremities: No LE edema. Some swelling to bilateral hands ?chronic Dialysis Access: LUA AVF +bruit  Dialysis Orders ([facility])  Dry wt 60.5kg   Bath 2K, 2.0Ca   3:30hrs  Heparin none   LUA AVF Hectorol 4 mcg   Epogen 1800    Venofer 50 mg q week  Labs: Hgb 11.9, Tsat 36%, Ferritin 764 in April, Phos 8.4, PTH 959.6, Ca 9   Assessment/Plan: 1 Acute diskitis/osteomyelitis- on vanc/cefipime 2 ESRD, off schedule, currently no heparin HD due to bleeding post-Rx; HD today 3 HTN- holding home BP meds, below dry wt 4 Anemia, cont epo and IV Fe 5 MBD- cont vit D, binders 6 HX CVA - left side deficit 7 DNR- PC team following 8 Carpal tunnel- bilat hand pain, may have surg as OP  Vinson Moselle  MD Pager [phone]    Cell  [phone] 08/11/2012, 7:29 AM       Additional Objective Labs: Basic Metabolic Panel:  Recent Labs Lab 08/08/12 1226 08/09/12 1308 08/10/12 0500  NA 134* 135 137  K 4.0 4.2 4.0  CL 95* 95* 99  CO2 24 24 29   GLUCOSE 103* 122* 86  BUN 39* 47* 13  CREATININE 6.49* 7.65* 4.00*  CALCIUM 10.0 10.0 9.3  PHOS 5.6* 5.1* 3.4   Liver Function Tests:  Recent Labs Lab 08/06/12 1200  08/07/12 0400 08/08/12 1226 08/09/12 1308 08/10/12 0500  AST 16  --  13  --   --   --   ALT 29  --  23  --   --   --   ALKPHOS 86   --  81  --   --   --   BILITOT 0.3  --  0.3  --   --   --   PROT 7.6  --  7.2  --   --   --   ALBUMIN 2.9*  < > 2.9* 2.7* 2.8* 2.5*  < > = values in this interval not displayed. No results found for this basename: LIPASE, AMYLASE,  in the last 168 hours CBC:  Recent Labs Lab 08/06/12 1312 08/07/12 0400 08/08/12 1226 08/09/12 1307 08/10/12 0500  WBC 3.5* 3.4* 3.8* 3.5* 3.2*  HGB 10.3* 10.3* 10.0* 9.8* 9.7*  HCT 31.2* 31.4* 30.7* 31.2* 30.6*  MCV 88.4 89.7 90.6 92.0 92.2  PLT PLATELET CLUMPS NOTED ON SMEAR, UNABLE TO ESTIMATE 189 172 217 174   Blood Culture    Component Value Date/Time   SDES BLOOD RIGHT WRIST 08/07/2012 1754   SPECREQUEST BOTTLES DRAWN AEROBIC ONLY 3CC 08/07/2012 1754   CULT        BLOOD CULTURE RECEIVED NO GROWTH TO DATE CULTURE WILL BE HELD FOR 5 DAYS BEFORE ISSUING  A FINAL NEGATIVE REPORT 08/07/2012 1754   REPTSTATUS PENDING 08/07/2012 1754    CBG:  Recent Labs Lab 08/07/12 1658  GLUCAP 111*   Studies/Results: Ct Abd Wo & W Cm  08/09/2012   *RADIOLOGY REPORT*  Clinical Data: Abnormal left renal ultrasound.  Cyst versus mass. The patient complains of left-sided pain.  History of dialysis for 14 years.  CT ABDOMEN WITHOUT AND WITH CONTRAST  Technique:  Multidetector CT imaging of the abdomen was performed following the standard protocol before and during bolus administration of intravenous contrast.  Contrast: OMNIPAQUE IOHEXOL 300 MG/ML  SOLN  Comparison: ultrasound renal 08/08/2012.  CT abdomen and pelvis 08/06/2012  Findings: Unenhanced images of the abdomen demonstrate a parenchymal calcification in the left kidney which may represent a small stone or dystrophic calcification.  This appears stable since previous study.  Calcification of the aorta and branch vessels. Surgical sutures in the midline anterior abdominal wall.  Contrast enhanced images:  Respiratory motion artifact limits visualization of the lung fields.  There appears to be atelectasis in  the lung bases with patchy infiltration in the right lung base. Cardiac enlargement.  Small esophageal hiatal hernia.  The kidneys are atrophic bilaterally. Delayed renal nephrograms bilaterally although there is some concentration of contrast in the renal parenchyma bilaterally. Nephrograms appear symmetrical.  No excretion is demonstrated at the time of delayed images.  Multiple low attenuation lesions are demonstrated throughout both kidneys. There is no definite contrast the cyst in the upper pole of the left kidney which was indeterminate by ultrasound demonstrates no obvious contrast enhancement.  Changes likely represent a benign complex cyst.  No definite solid enhancing lesion is demonstrated in the kidneys.  No hydronephrosis.  The liver, spleen, gallbladder, pancreas, adrenal glands, inferior vena cava, and retroperitoneal lymph nodes are unremarkable. Calcification of the abdominal aorta without aneurysm.  The stomach and small bowel are decompressed.  Contrast material in the colon without distension.  No free air or free fluid demonstrated in the abdomen.  Loss of disc space height and endplate destruction at T11- 12 suggesting diskitis/osteomyelitis is again demonstrated.  There appears to be mild progressive loss of the disc space since the previous study.  Mild soft tissue swelling in the paraspinal space at this level.  Degenerative changes in the visualized lumbar spine.  Changes of previous diskitis at L4-5 is incompletely evaluated on this study but appears stable.  IMPRESSION: Bilateral renal atrophy with delayed nephrogram consistent with chronic medical renal disease.  Multiple bilateral renal cysts.  No definite contrast enhancement demonstrated in the cysts.  No solid lesion identified in the upper pole of the left kidney.  Previous ultrasound changes likely represent  complex benign cysts.   Original Report Authenticated By: Burman Nieves, M.D.   Medications:   . calcium acetate   2,001 mg Oral TID WC  . capsaicin   Topical BID  . [START ON 08/12/2012] cefTAZidime (FORTAZ)  IV  2 g Intravenous Q T,Th,Sat-1800  . darbepoetin (ARANESP) injection - DIALYSIS  60 mcg Intravenous Q Fri-HD  . fentaNYL  25 mcg Transdermal Q72H  . ferric gluconate (FERRLECIT/NULECIT) IV  125 mg Intravenous Q Wed-HD  . heparin  5,000 Units Subcutaneous Q8H  . multivitamin  1 tablet Oral Q supper  . pantoprazole  80 mg Oral Q1200  . QUEtiapine  50 mg Oral QHS  . rOPINIRole  0.5 mg Oral Daily  . sodium chloride  3 mL Intravenous Q12H  . [START ON 08/12/2012] vancomycin  500 mg Intravenous Q T,Th,Sa-HD

## 2012-08-11 NOTE — Progress Notes (Signed)
08/11/2012 called [facility]. Reported patient had Pneumonia  Vaccine February 24, 2008. [facility] RN.

## 2012-08-14 LAB — CULTURE, BLOOD (ROUTINE X 2)

## 2012-08-31 NOTE — Consult Note (Signed)
I have reviewed and discussed the care of this patient in detail with the nurse practitioner including pertinent patient records, physical exam findings and data. I agree with details of this encounter.  

## 2012-09-07 ENCOUNTER — Ambulatory Visit (INDEPENDENT_AMBULATORY_CARE_PROVIDER_SITE_OTHER): Payer: Medicare Other | Admitting: Internal Medicine

## 2012-09-07 ENCOUNTER — Telehealth: Payer: Self-pay | Admitting: *Deleted

## 2012-09-07 ENCOUNTER — Encounter: Payer: Self-pay | Admitting: Internal Medicine

## 2012-09-07 VITALS — BP 138/89 | HR 98 | Temp 98.2°F | Ht 58.75 in | Wt 132.5 lb

## 2012-09-07 DIAGNOSIS — M4640 Discitis, unspecified, site unspecified: Secondary | ICD-10-CM

## 2012-09-07 DIAGNOSIS — M519 Unspecified thoracic, thoracolumbar and lumbosacral intervertebral disc disorder: Secondary | ICD-10-CM

## 2012-09-07 NOTE — Telephone Encounter (Signed)
Dialysis RN to obtain order through pt's nephrologist for lab work to be draw.  [facility] fax number provided.  Expect lab results by 09/10/12.

## 2012-09-07 NOTE — Progress Notes (Signed)
Patient ID: Julia Small, female   DOB: 1935/03/09, 77 y.o.   MRN: [id number]         [facility] for Infectious Disease  Reason for Consult: Culture negative T10-11 discitis and possible aortic valve endocarditis Referring Physician: Dr. Onalee Hua Small  Patient Active Problem List   Diagnosis Date Noted  . Pain in back 08/09/2012  . Palliative care encounter 08/09/2012  . Generalized weakness 08/09/2012  . Unspecified constipation 08/09/2012  . Bacterial endocarditis 08/09/2012  . ESRD (end stage renal disease) on dialysis 08/06/2012  . Diskitis 08/06/2012  . HTN (hypertension) 08/06/2012  . GERD (gastroesophageal reflux disease) 08/06/2012    Patient's Medications  New Prescriptions   No medications on file  Previous Medications   B COMPLEX-VITAMIN C-FOLIC ACID (NEPHRO-VITE) 0.8 MG TABS    Take 0.8 mg by mouth daily.   CALCIUM ACETATE (PHOSLO) 667 MG CAPSULE    Take 3 capsules (2,001 mg total) by mouth 3 (three) times daily with meals.   CAPSAICIN (ZOSTRIX) 0.025 % CREAM    Apply topically 2 (two) times daily.   CIPROFLOXACIN (CIPRO) 750 MG TABLET    Take 750 mg by mouth 2 (two) times daily.   ESOMEPRAZOLE (NEXIUM) 40 MG CAPSULE    Take 40 mg by mouth daily.   FENTANYL (DURAGESIC - DOSED MCG/HR) 25 MCG/HR    Place 1 patch (25 mcg total) onto the skin every 3 (three) days.   HYDROCODONE-ACETAMINOPHEN (NORCO) 10-325 MG PER TABLET    Take 2 tablets by mouth every 4 (four) hours as needed for pain.   NUTRITIONAL SUPPLEMENTS (FEEDING SUPPLEMENT, NEPRO CARB STEADY,) LIQD    Take 237 mLs by mouth 2 (two) times daily between meals.   QUETIAPINE (SEROQUEL) 50 MG TABLET    Take 1 tablet (50 mg total) by mouth at bedtime.   RANITIDINE (ZANTAC) 150 MG TABLET    Take 150 mg by mouth 2 (two) times daily.   ROPINIROLE (REQUIP) 0.5 MG TABLET    Take 0.5 mg by mouth daily.   ZOLPIDEM (AMBIEN) 10 MG TABLET    Take 10 mg by mouth at bedtime as needed for sleep.  Modified Medications   No  medications on file  Discontinued Medications   HYDROCODONE-ACETAMINOPHEN (NORCO) 7.5-325 MG PER TABLET    Take 1 tablet by mouth 3 (three) times daily as needed for pain.    Recommendations: 1. Continue vancomycin and ceftazidime for at least 10 more days 2. Repeat sedimentation rate and C-reactive protein 3. Followup here on August 26   Assessment: Julia Small is improving on empiric therapy for culture negative discitis. She has a heart murmur but no definitive evidence of endocarditis. She's received 32 days of therapy and we'll plan on at least 10 more days of treatment.   HPI: Julia Small is a 77 y.o. female with end-stage renal disease on hemodialysis who developed progressive mid back pain 2-3 months ago. He became increasingly severe, 10 out of 10, leading to her hospitalization and an MRI scan on July 18. The scan showed evidence of discitis at the T10-11 level. Admission blood cultures were negative and a lumbar aspirate obtained 1 cc of bloody fluid showed no organisms on stain and no growth on culture. A transthoracic echocardiogram revealed some calcification on her aortic valve and a vegetation could not be excluded. A transesophageal echocardiogram was not done because it was felt that the results would not alter her therapy or management. He is been receiving empiric vancomycin and  ceftazidime 3 days weekly after hemodialysis. She is feeling better and her back pain is now only 4/10. She has been able to be a little more active with physical therapy.  Review of Systems: Constitutional: positive for malaise, negative for anorexia, chills, fevers, sweats and weight loss Eyes: negative Ears, nose, mouth, throat, and face: negative Respiratory: negative Cardiovascular: positive for heart murmur, negative for chest pain, chest pressure/discomfort and dyspnea Gastrointestinal: negative Genitourinary:negative    Past Medical History  Diagnosis Date  . Renal disorder   .  Hypertension   . GERD (gastroesophageal reflux disease)   . Arthritis   . Renal failure   . CVA (cerebral infarction)     Left side weakness and facial droop  . Carpal tunnel syndrome   . Secondary hyperparathyroidism (of renal origin)   . Renal insufficiency     History  Substance Use Topics  . Smoking status: Former Smoker -- 10 years  . Smokeless tobacco: Not on file  . Alcohol Use: Yes     Comment: occasional   History reviewed. No pertinent family history that she knows of.  No Known Allergies  OBJECTIVE: Blood pressure 138/89, pulse 98, temperature 98.2 F (36.8 C), temperature source Oral, height 4' 10.75" (1.492 m), weight 132 lb 8 oz (60.102 kg). General: She is seated in her wheelchair she is alert and in no distress Skin: No splint or conjunctival hemorrhages Lungs: Clear Cor: Regular S1 and S2 with a 2/6 early systolic murmur Abdomen: Nontender Mild tenderness with percussion over her mid spine  Lab Results  Component Value Date   CRP 8.8* 08/07/2012   Lab Results  Component Value Date   ESRSEDRATE 65* 08/07/2012   Microbiology: Blood cultures 08/06/2012: No growth T 10-11 aspirate 08/06/2012: No organisms seen on Gram stain; no growth  Julia Asters, MD [facility] for Infectious Disease [facility] [phone] pager   [phone] cell 09/07/2012, 11:31 AM

## 2012-09-08 ENCOUNTER — Inpatient Hospital Stay: Payer: Medicare Other | Admitting: Internal Medicine

## 2012-09-14 ENCOUNTER — Ambulatory Visit (INDEPENDENT_AMBULATORY_CARE_PROVIDER_SITE_OTHER): Payer: Medicare Other | Admitting: Internal Medicine

## 2012-09-14 ENCOUNTER — Encounter: Payer: Self-pay | Admitting: Internal Medicine

## 2012-09-14 VITALS — BP 175/118 | HR 98 | Temp 98.1°F | Ht 58.5 in | Wt 134.5 lb

## 2012-09-14 DIAGNOSIS — M519 Unspecified thoracic, thoracolumbar and lumbosacral intervertebral disc disorder: Secondary | ICD-10-CM

## 2012-09-14 DIAGNOSIS — M4640 Discitis, unspecified, site unspecified: Secondary | ICD-10-CM

## 2012-09-14 NOTE — Addendum Note (Signed)
Addended by: Cliffton Asters on: 09/14/2012 04:01 PM   Modules accepted: Orders

## 2012-09-14 NOTE — Progress Notes (Signed)
Patient ID: Julia Small, female   DOB: 08/28/1935, 77 y.o.   MRN: [id number]         [facility] for Infectious Disease  Patient Active Problem List   Diagnosis Date Noted  . Pain in back 08/09/2012  . Palliative care encounter 08/09/2012  . Generalized weakness 08/09/2012  . Unspecified constipation 08/09/2012  . Bacterial endocarditis 08/09/2012  . ESRD (end stage renal disease) on dialysis 08/06/2012  . Diskitis 08/06/2012  . HTN (hypertension) 08/06/2012  . GERD (gastroesophageal reflux disease) 08/06/2012    Patient's Medications  New Prescriptions   No medications on file  Previous Medications   B COMPLEX-VITAMIN C-FOLIC ACID (NEPHRO-VITE) 0.8 MG TABS    Take 0.8 mg by mouth daily.   CALCIUM ACETATE (PHOSLO) 667 MG CAPSULE    Take 3 capsules (2,001 mg total) by mouth 3 (three) times daily with meals.   CAPSAICIN (ZOSTRIX) 0.025 % CREAM    Apply topically 2 (two) times daily.   CEFTAZIDIME (FORTAZ) 200 MG/ML INJ INJECTION    Inject into the peritoneum one time in dialysis.   ESOMEPRAZOLE (NEXIUM) 40 MG CAPSULE    Take 40 mg by mouth daily.   FENTANYL (DURAGESIC - DOSED MCG/HR) 25 MCG/HR    Place 1 patch (25 mcg total) onto the skin every 3 (three) days.   HYDROCODONE-ACETAMINOPHEN (NORCO) 10-325 MG PER TABLET    Take 2 tablets by mouth every 4 (four) hours as needed for pain.   NUTRITIONAL SUPPLEMENTS (FEEDING SUPPLEMENT, NEPRO CARB STEADY,) LIQD    Take 237 mLs by mouth 2 (two) times daily between meals.   QUETIAPINE (SEROQUEL) 50 MG TABLET    Take 1 tablet (50 mg total) by mouth at bedtime.   RANITIDINE (ZANTAC) 150 MG TABLET    Take 150 mg by mouth 2 (two) times daily.   ROPINIROLE (REQUIP) 0.5 MG TABLET    Take 0.5 mg by mouth daily.   SODIUM CHLORIDE 0.9 % SOLN 250 ML WITH VANCOMYCIN 1000 MG SOLR 1,000 MG    Inject 1,000 mg into the vein every hemodialysis.   ZOLPIDEM (AMBIEN) 10 MG TABLET    Take 10 mg by mouth at bedtime as needed for sleep.  Modified  Medications   No medications on file  Discontinued Medications   CIPROFLOXACIN (CIPRO) 750 MG TABLET    Take 750 mg by mouth 2 (two) times daily.    Subjective: Ms. Estrin is in for her routine followup visit. She is now completed 39 days of empiric IV vancomycin and ceftazidime after each hemodialysis treatment for culture-negative vertebral infection at the T11-12 level. She is feeling much better with significantly improved pain. She no longer has any pain that wraps around her like a belt". Over the past weekend she had difficulty with constipation received an enema. She large bowel movement and immediately felt much better with decreased abdominal pain. She does not believe she is needing any hydrocodone recently. She has not had any fever, chills or sweats. Review of Systems: Pertinent items are noted in HPI.  Past Medical History  Diagnosis Date  . Renal disorder   . Hypertension   . GERD (gastroesophageal reflux disease)   . Arthritis   . Renal failure   . CVA (cerebral infarction)     Left side weakness and facial droop  . Carpal tunnel syndrome   . Secondary hyperparathyroidism (of renal origin)   . Renal insufficiency     History  Substance Use Topics  . Smoking  status: Former Smoker -- 10 years  . Smokeless tobacco: Not on file  . Alcohol Use: Yes     Comment: occasional    No family history on file.  No Known Allergies  Objective: Temp: 98.1 F (36.7 C) (08/26 1529) Temp src: Oral (08/26 1529) BP: 175/118 mmHg (08/26 1529) Pulse Rate: 98 (08/26 1529)  General: She is alert and in better spirits Skin: No rash Lungs: Clear Cor: Regular S1 and S2 with an unchanged 2/6 systolic murmur Abdomen: Soft and nontender Back: No tenderness over her spine Joints and extremities: Good thrill and left upper arm graft   Assessment: She is doing much better on therapy for culture-negative vertebral infection. I will continue her antibiotics for 2 more dialysis  treatments in stop and observe off of antibiotics.  Plan: 1. Discontinue vancomycin and ceftazidime after her doses on August 29 2. Followup in 6 weeks   Cliffton Asters, MD [facility] for Infectious Disease [facility] [phone] pager   [phone] cell 09/14/2012, 3:51 PM

## 2012-09-24 ENCOUNTER — Emergency Department (HOSPITAL_COMMUNITY): Payer: Medicare Other

## 2012-09-24 ENCOUNTER — Emergency Department (HOSPITAL_COMMUNITY)
Admission: EM | Admit: 2012-09-24 | Discharge: 2012-09-24 | Disposition: A | Discharge status: Skilled Nursing Facility | Payer: Medicare Other | Attending: Emergency Medicine | Admitting: Emergency Medicine

## 2012-09-24 ENCOUNTER — Encounter (HOSPITAL_COMMUNITY): Payer: Self-pay | Admitting: *Deleted

## 2012-09-24 DIAGNOSIS — Z8639 Personal history of other endocrine, nutritional and metabolic disease: Secondary | ICD-10-CM | POA: Insufficient documentation

## 2012-09-24 DIAGNOSIS — I10 Essential (primary) hypertension: Secondary | ICD-10-CM | POA: Insufficient documentation

## 2012-09-24 DIAGNOSIS — N19 Unspecified kidney failure: Secondary | ICD-10-CM | POA: Insufficient documentation

## 2012-09-24 DIAGNOSIS — Z8673 Personal history of transient ischemic attack (TIA), and cerebral infarction without residual deficits: Secondary | ICD-10-CM | POA: Insufficient documentation

## 2012-09-24 DIAGNOSIS — G8929 Other chronic pain: Secondary | ICD-10-CM | POA: Insufficient documentation

## 2012-09-24 DIAGNOSIS — R109 Unspecified abdominal pain: Secondary | ICD-10-CM | POA: Insufficient documentation

## 2012-09-24 DIAGNOSIS — Z8669 Personal history of other diseases of the nervous system and sense organs: Secondary | ICD-10-CM | POA: Insufficient documentation

## 2012-09-24 DIAGNOSIS — R011 Cardiac murmur, unspecified: Secondary | ICD-10-CM | POA: Insufficient documentation

## 2012-09-24 DIAGNOSIS — Z79899 Other long term (current) drug therapy: Secondary | ICD-10-CM | POA: Insufficient documentation

## 2012-09-24 DIAGNOSIS — Z87448 Personal history of other diseases of urinary system: Secondary | ICD-10-CM | POA: Insufficient documentation

## 2012-09-24 DIAGNOSIS — M129 Arthropathy, unspecified: Secondary | ICD-10-CM | POA: Insufficient documentation

## 2012-09-24 DIAGNOSIS — K5900 Constipation, unspecified: Secondary | ICD-10-CM

## 2012-09-24 DIAGNOSIS — Z862 Personal history of diseases of the blood and blood-forming organs and certain disorders involving the immune mechanism: Secondary | ICD-10-CM | POA: Insufficient documentation

## 2012-09-24 DIAGNOSIS — Z87891 Personal history of nicotine dependence: Secondary | ICD-10-CM | POA: Insufficient documentation

## 2012-09-24 DIAGNOSIS — K219 Gastro-esophageal reflux disease without esophagitis: Secondary | ICD-10-CM | POA: Insufficient documentation

## 2012-09-24 LAB — CBC WITH DIFFERENTIAL/PLATELET
Basophils Absolute: 0 10*3/uL (ref 0.0–0.1)
Basophils Relative: 1 % (ref 0–1)
Eosinophils Absolute: 0.1 10*3/uL (ref 0.0–0.7)
Eosinophils Relative: 2 % (ref 0–5)
MCH: 29.6 pg (ref 26.0–34.0)
MCV: 90.8 fL (ref 78.0–100.0)
Neutrophils Relative %: 55 % (ref 43–77)
Platelets: 137 10*3/uL — ABNORMAL LOW (ref 150–400)
RDW: 14.8 % (ref 11.5–15.5)

## 2012-09-24 LAB — COMPREHENSIVE METABOLIC PANEL
ALT: 7 U/L (ref 0–35)
AST: 14 U/L (ref 0–37)
Albumin: 2.9 g/dL — ABNORMAL LOW (ref 3.5–5.2)
Calcium: 10.2 mg/dL (ref 8.4–10.5)
GFR calc Af Amer: 8 mL/min — ABNORMAL LOW (ref >90)
Potassium: 3.7 mEq/L (ref 3.5–5.1)
Sodium: 136 mEq/L (ref 135–145)
Total Protein: 7.6 g/dL (ref 6.0–8.3)

## 2012-09-24 MED ORDER — FLEET ENEMA 7-19 GM/118ML RE ENEM
1.0000 | ENEMA | Freq: Once | RECTAL | Status: AC
  Administered 2012-09-24: 1 via RECTAL
  Filled 2012-09-24: qty 1

## 2012-09-24 NOTE — ED Notes (Signed)
Called [facility] for transportation back home 

## 2012-09-24 NOTE — ED Notes (Signed)
Patient transported to X-ray 

## 2012-09-24 NOTE — ED Notes (Signed)
Spoke with Dawn at [facility] in [place] & was informed that they do dialysis tomorrow morning at 0600.

## 2012-09-24 NOTE — ED Provider Notes (Addendum)
CSN: [id number]     Arrival date & time 09/24/12  0944 History   First MD Initiated Contact with Patient 09/24/12 1005     Chief Complaint  Patient presents with  . Constipation  . Abdominal Pain   (Consider location/radiation/quality/duration/timing/severity/associated sxs/prior Treatment) HPI Comments: 77 yo female resident at [facility], [facility], [facility] scheduled to be d/c tomorrow.  Pt presents with c/o constipation. Pt recently finished a 6 month course of IV antibiotics for concern for discitis - cultures were negative.  Pt reports chronic constipation for which she was seen multiple times.  She reports no BM for three days.  Pt has ESRD and is receiving HD M-W-F.   Patient is a 77 y.o. female presenting with constipation and abdominal pain. The history is provided by the patient.  Constipation Severity:  Mild Time since last bowel movement:  4 days Timing:  Constant Progression:  Unchanged Chronicity:  Chronic Context: narcotics   Context: not dehydration, not dietary changes and not stress   Stool description:  None produced Unusual stool frequency:  Last BM 3 days ago, small Worsened by:  Nothing tried Ineffective treatments:  None tried Associated symptoms: abdominal pain   Associated symptoms: no diarrhea and no nausea   Risk factors: hx of abdominal surgery   Risk factors comment:  TAH, Csection x 2 Abdominal Pain Associated symptoms: constipation   Associated symptoms: no chills, no diarrhea and no nausea     Past Medical History  Diagnosis Date  . Renal disorder   . Hypertension   . GERD (gastroesophageal reflux disease)   . Arthritis   . Renal failure   . CVA (cerebral infarction)     Left side weakness and facial droop  . Carpal tunnel syndrome   . Secondary hyperparathyroidism (of renal origin)   . Renal insufficiency    Past Surgical History  Procedure Laterality Date  . Ankle reconstruction    . Cesarean section      three  . Sp av dialysis shunt  access existing *l* Left   . Abdominal hysterectomy     No family history on file. History  Substance Use Topics  . Smoking status: Former Smoker -- 10 years  . Smokeless tobacco: Not on file  . Alcohol Use: Yes     Comment: occasional   OB History   Grav Para Term Preterm Abortions TAB SAB Ect Mult Living   3 3 3       3      Review of Systems  Constitutional: Negative for chills, activity change and appetite change.  HENT: Negative for hearing loss, ear pain, nosebleeds, congestion, tinnitus and ear discharge.   Eyes: Negative.   Respiratory: Negative.   Cardiovascular: Negative.   Gastrointestinal: Positive for abdominal pain and constipation. Negative for nausea, diarrhea, blood in stool, anal bleeding and rectal pain.  Endocrine: Negative.   Genitourinary: Negative.     Allergies  Review of patient's allergies indicates no known allergies.  Home Medications   Current Outpatient Rx  Name  Route  Sig  Dispense  Refill  . b complex-vitamin c-folic acid (NEPHRO-VITE) 0.8 MG TABS   Oral   Take 0.8 mg by mouth daily.         . calcium acetate (PHOSLO) 667 MG capsule   Oral   Take 3 capsules (2,001 mg total) by mouth 3 (three) times daily with meals.   180 capsule   0   . dicyclomine (BENTYL) 20 MG tablet  Oral   Take 20 mg by mouth every 6 (six) hours.         . docusate sodium (COLACE) 50 MG capsule   Oral   Take by mouth 2 (two) times daily.         Marland Kitchen esomeprazole (NEXIUM) 40 MG capsule   Oral   Take 40 mg by mouth daily.         Marland Kitchen HYDROcodone-acetaminophen (NORCO) 7.5-325 MG per tablet   Oral   Take 1 tablet by mouth every 6 (six) hours as needed for pain.         Marland Kitchen losartan (COZAAR) 50 MG tablet   Oral   Take 50 mg by mouth daily.         . Nutritional Supplements (FEEDING SUPPLEMENT, NEPRO CARB STEADY,) LIQD   Oral   Take 237 mLs by mouth 2 (two) times daily between meals.      0   . potassium chloride (K-DUR) 10 MEQ tablet    Oral   Take 10 mEq by mouth 2 (two) times daily.         . QUEtiapine (SEROQUEL) 50 MG tablet   Oral   Take 1 tablet (50 mg total) by mouth at bedtime.   30 tablet   0   . rOPINIRole (REQUIP) 0.5 MG tablet   Oral   Take 0.5 mg by mouth daily.         Marland Kitchen senna (SENOKOT) 8.6 MG TABS tablet   Oral   Take 1 tablet by mouth 2 (two) times daily.         . fentaNYL (DURAGESIC - DOSED MCG/HR) 25 MCG/HR   Transdermal   Place 1 patch (25 mcg total) onto the skin every 3 (three) days.   5 patch   0   . zolpidem (AMBIEN) 10 MG tablet   Oral   Take 10 mg by mouth at bedtime as needed for sleep.          BP 180/119  Pulse 96  Temp(Src) 97.8 F (36.6 C) (Oral)  Resp 18  SpO2 98% Physical Exam  Constitutional: She is oriented to person, place, and time. She appears well-developed and well-nourished.  HENT:  Head: Normocephalic and atraumatic.  Eyes: Conjunctivae are normal. Pupils are equal, round, and reactive to light.  Neck: Normal range of motion. Neck supple.  Cardiovascular: Normal rate, regular rhythm and intact distal pulses.   Murmur heard. Pulmonary/Chest: Effort normal and breath sounds normal.  Abdominal: Soft. Bowel sounds are normal. She exhibits no distension and no mass. There is no tenderness. There is no rebound and no guarding.  Musculoskeletal: Normal range of motion.  HD shunt in RUE  Neurological: She is alert and oriented to person, place, and time. She has normal reflexes.    ED Course  Procedures (including critical care time) Labs Review Labs Reviewed  CBC WITH DIFFERENTIAL - Abnormal; Notable for the following:    WBC 3.3 (*)    RBC 3.82 (*)    Hemoglobin 11.3 (*)    HCT 34.7 (*)    Platelets 137 (*)    Monocytes Relative 15 (*)    All other components within normal limits  COMPREHENSIVE METABOLIC PANEL - Abnormal; Notable for the following:    Glucose, Bld 115 (*)    BUN 28 (*)    Creatinine, Ser 5.51 (*)    Albumin 2.9 (*)    Total  Bilirubin 0.2 (*)    GFR calc non  Af Amer 7 (*)    GFR calc Af Amer 8 (*)    All other components within normal limits   Imaging Review Dg Abd Acute W/chest  09/24/2012   *RADIOLOGY REPORT*  Clinical Data: Constipation with abdominal pain.  ACUTE ABDOMEN SERIES (ABDOMEN 2 VIEW & CHEST 1 VIEW)  Comparison: Chest x-ray from 09/19/2012.  Abdomen study from 09/17/2012.  Findings: The cardiopericardial silhouette is enlarged. There is pulmonary vascular congestion without overt pulmonary edema.  No focal airspace consolidation. Telemetry leads overlie the chest.  Upright film shows no evidence for intraperitoneal free air. Supine film shows no gaseous bowel dilatation to suggest obstruction.  Prominent stool noted in the rectal vault.  Stable sclerotic changes of the SI joints.  IMPRESSION: Pulmonary vascular congestion without acute cardiopulmonary findings.  No evidence for bowel obstruction or perforation.   Original Report Authenticated By: Kennith Center, M.D.    Results for orders placed during the hospital encounter of 09/24/12  CBC WITH DIFFERENTIAL      Result Value Range   WBC 3.3 (*) 4.0 - 10.5 K/uL   RBC 3.82 (*) 3.87 - 5.11 MIL/uL   Hemoglobin 11.3 (*) 12.0 - 15.0 g/dL   HCT 47.8 (*) 29.5 - 62.1 %   MCV 90.8  78.0 - 100.0 fL   MCH 29.6  26.0 - 34.0 pg   MCHC 32.6  30.0 - 36.0 g/dL   RDW 30.8  65.7 - 84.6 %   Platelets 137 (*) 150 - 400 K/uL   Neutrophils Relative % 55  43 - 77 %   Neutro Abs 1.8  1.7 - 7.7 K/uL   Lymphocytes Relative 27  12 - 46 %   Lymphs Abs 0.9  0.7 - 4.0 K/uL   Monocytes Relative 15 (*) 3 - 12 %   Monocytes Absolute 0.5  0.1 - 1.0 K/uL   Eosinophils Relative 2  0 - 5 %   Eosinophils Absolute 0.1  0.0 - 0.7 K/uL   Basophils Relative 1  0 - 1 %   Basophils Absolute 0.0  0.0 - 0.1 K/uL  COMPREHENSIVE METABOLIC PANEL      Result Value Range   Sodium 136  135 - 145 mEq/L   Potassium 3.7  3.5 - 5.1 mEq/L   Chloride 96  96 - 112 mEq/L   CO2 27  19 - 32 mEq/L    Glucose, Bld 115 (*) 70 - 99 mg/dL   BUN 28 (*) 6 - 23 mg/dL   Creatinine, Ser 9.62 (*) 0.50 - 1.10 mg/dL   Calcium 95.2  8.4 - 84.1 mg/dL   Total Protein 7.6  6.0 - 8.3 g/dL   Albumin 2.9 (*) 3.5 - 5.2 g/dL   AST 14  0 - 37 U/L   ALT 7  0 - 35 U/L   Alkaline Phosphatase 103  39 - 117 U/L   Total Bilirubin 0.2 (*) 0.3 - 1.2 mg/dL   GFR calc non Af Amer 7 (*) >90 mL/min   GFR calc Af Amer 8 (*) >90 mL/min      CT ABD/Pelvis w/ and w/o from August 09, 2012 IMPRESSION:  Bilateral renal atrophy with delayed nephrogram consistent with  chronic medical renal disease. Multiple bilateral renal cysts. No  definite contrast enhancement demonstrated in the cysts. No solid  lesion identified in the upper pole of the left kidney. Previous  ultrasound changes likely represent complex benign cysts.   ER Course:  Vital signs stable, benign abdomen, labs  and AAS negative.  Patient given fleets enema with large amounts of stool produced and relief of her symptoms. We'll discharge patient and she is to continue with her current bowel regimen and medications.     MDM   1. Constipation   2. Abdominal pain    77 year old American female presents emergency with chief complaint of constipation with no BM for 3 days. ER workup was negative. Patient had a large bowel movement after Fleet enema given. Her symptoms have improved. At this point I doubt serious bacterial illness, surgical abdomen, or any other emergent etiology or indication for consult or admission. Patient is stable to be discharged back to her rehabilitation facility at this time. She should follow with her primary care provider. Emergency department return precautions were given.  Called [facility] facility and [facility] (HD facility) Pt is to have HD at 6:00 AM tomorrow morning 09/25/12.     Darlys Gales, MD 09/24/12 1402  Darlys Gales, MD 09/24/12 [phone]

## 2012-09-24 NOTE — ED Notes (Signed)
Discharge instructions given to [place] at [facility]. Instructed that arrangements were made for [facility] to [facility] 0600 for [facility].  Awaiting [facility] for transportation

## 2012-09-24 NOTE — ED Notes (Addendum)
From [facility] - pt c/o constipation & abd pain x 3 days. Also due for hemodialysis today. (M-W-F schedule)

## 2012-09-24 NOTE — ED Notes (Addendum)
Pt has been receiving senna & colace daily without relief. Denies n/v. C/o lower abd pain, bloating & unability to pass gas. Reports pain improved a little after taking a pain pill this morning. States has the urge to go but unable. Pt is due to be at dialysis at 1030.  Pt also expressed concern about being discharged from assisted living home tomorrow as planned.

## 2012-10-19 ENCOUNTER — Ambulatory Visit: Payer: Medicare Other | Admitting: Internal Medicine

## 2012-10-20 ENCOUNTER — Encounter (HOSPITAL_COMMUNITY): Payer: Self-pay | Admitting: *Deleted

## 2012-10-20 ENCOUNTER — Emergency Department (HOSPITAL_COMMUNITY)
Admission: EM | Admit: 2012-10-20 | Discharge: 2012-10-21 | Disposition: A | Discharge status: Home / Self Care | Payer: Medicare Other | Attending: Emergency Medicine | Admitting: Emergency Medicine

## 2012-10-20 ENCOUNTER — Emergency Department (HOSPITAL_COMMUNITY): Payer: Medicare Other

## 2012-10-20 DIAGNOSIS — K219 Gastro-esophageal reflux disease without esophagitis: Secondary | ICD-10-CM | POA: Insufficient documentation

## 2012-10-20 DIAGNOSIS — M129 Arthropathy, unspecified: Secondary | ICD-10-CM | POA: Insufficient documentation

## 2012-10-20 DIAGNOSIS — N186 End stage renal disease: Secondary | ICD-10-CM | POA: Insufficient documentation

## 2012-10-20 DIAGNOSIS — Z992 Dependence on renal dialysis: Secondary | ICD-10-CM | POA: Insufficient documentation

## 2012-10-20 DIAGNOSIS — Z87891 Personal history of nicotine dependence: Secondary | ICD-10-CM | POA: Insufficient documentation

## 2012-10-20 DIAGNOSIS — R109 Unspecified abdominal pain: Secondary | ICD-10-CM

## 2012-10-20 DIAGNOSIS — Z90710 Acquired absence of both cervix and uterus: Secondary | ICD-10-CM | POA: Insufficient documentation

## 2012-10-20 DIAGNOSIS — Z79899 Other long term (current) drug therapy: Secondary | ICD-10-CM | POA: Insufficient documentation

## 2012-10-20 DIAGNOSIS — Z8673 Personal history of transient ischemic attack (TIA), and cerebral infarction without residual deficits: Secondary | ICD-10-CM | POA: Insufficient documentation

## 2012-10-20 DIAGNOSIS — Z8669 Personal history of other diseases of the nervous system and sense organs: Secondary | ICD-10-CM | POA: Insufficient documentation

## 2012-10-20 DIAGNOSIS — I120 Hypertensive chronic kidney disease with stage 5 chronic kidney disease or end stage renal disease: Secondary | ICD-10-CM | POA: Insufficient documentation

## 2012-10-20 DIAGNOSIS — R1032 Left lower quadrant pain: Secondary | ICD-10-CM | POA: Insufficient documentation

## 2012-10-20 DIAGNOSIS — R1031 Right lower quadrant pain: Secondary | ICD-10-CM | POA: Insufficient documentation

## 2012-10-20 LAB — CBC WITH DIFFERENTIAL/PLATELET
Basophils Relative: 0 % (ref 0–1)
HCT: 35.1 % — ABNORMAL LOW (ref 36.0–46.0)
Hemoglobin: 11.7 g/dL — ABNORMAL LOW (ref 12.0–15.0)
MCHC: 33.3 g/dL (ref 30.0–36.0)
Monocytes Absolute: 0.4 10*3/uL (ref 0.1–1.0)
Monocytes Relative: 15 % — ABNORMAL HIGH (ref 3–12)
Neutro Abs: 1.2 10*3/uL — ABNORMAL LOW (ref 1.7–7.7)
Platelets: 164 10*3/uL (ref 150–400)

## 2012-10-20 LAB — CG4 I-STAT (LACTIC ACID): Lactic Acid, Venous: 1.62 mmol/L (ref 0.5–2.2)

## 2012-10-20 LAB — COMPREHENSIVE METABOLIC PANEL
ALT: 9 U/L (ref 0–35)
Albumin: 3.5 g/dL (ref 3.5–5.2)
BUN: 14 mg/dL (ref 6–23)
CO2: 26 mEq/L (ref 19–32)
Chloride: 98 mEq/L (ref 96–112)
Creatinine, Ser: 3.7 mg/dL — ABNORMAL HIGH (ref 0.50–1.10)
GFR calc Af Amer: 13 mL/min — ABNORMAL LOW (ref >90)
GFR calc non Af Amer: 11 mL/min — ABNORMAL LOW (ref >90)
Potassium: 3.5 mEq/L (ref 3.5–5.1)
Sodium: 142 mEq/L (ref 135–145)
Total Bilirubin: 0.4 mg/dL (ref 0.3–1.2)

## 2012-10-20 LAB — LIPASE, BLOOD: Lipase: 29 U/L (ref 11–59)

## 2012-10-20 MED ORDER — MORPHINE SULFATE 4 MG/ML IJ SOLN
4.0000 mg | Freq: Once | INTRAMUSCULAR | Status: AC
  Administered 2012-10-20: 4 mg via INTRAVENOUS
  Filled 2012-10-20: qty 1

## 2012-10-20 MED ORDER — SODIUM CHLORIDE 0.9 % IV SOLN
INTRAVENOUS | Status: DC
  Administered 2012-10-20: 22:00:00 via INTRAVENOUS

## 2012-10-20 MED ORDER — ONDANSETRON HCL 4 MG/2ML IJ SOLN
4.0000 mg | Freq: Once | INTRAMUSCULAR | Status: AC
  Administered 2012-10-20: 4 mg via INTRAVENOUS
  Filled 2012-10-20: qty 2

## 2012-10-20 MED ORDER — IOHEXOL 300 MG/ML  SOLN
25.0000 mL | INTRAMUSCULAR | Status: AC
  Administered 2012-10-20 (×2): 25 mL via ORAL

## 2012-10-20 NOTE — ED Notes (Signed)
Pt arrives via [facility]. C/o 3months of abd pain. Pt was supposed to see GI dr but has not gone to see one yet. CBG 62. Pt in afib. Pt states she is constipated.   MWF dialysis. Shunt on left.

## 2012-10-20 NOTE — ED Notes (Signed)
Attempted IV start. Pt would not allow advancement of catheter. Stated "im not a good patient, if you don't get it right away then take it out." IV team paged.

## 2012-10-20 NOTE — ED Provider Notes (Signed)
CSN: [id number]     Arrival date & time 10/20/12  1920 History   First MD Initiated Contact with Patient 10/20/12 1933     Chief Complaint  Patient presents with  . Abdominal Pain   (Consider location/radiation/quality/duration/timing/severity/associated sxs/prior Treatment) Patient is a 77 y.o. female presenting with abdominal pain. The history is provided by the patient.  Abdominal Pain  patient here complaining of 24 hours of abdominal pain characterized as sharp and localized to her lower quadrants. She notes emesis x2. No fever or chills reported. Symptoms start at dialysis today. She does make urine. Nothing makes her pain better worse. Has been having copious muscle loose stools.  Past Medical History  Diagnosis Date  . Renal disorder   . Hypertension   . GERD (gastroesophageal reflux disease)   . Arthritis   . Renal failure   . CVA (cerebral infarction)     Left side weakness and facial droop  . Carpal tunnel syndrome   . Secondary hyperparathyroidism (of renal origin)   . Renal insufficiency    Past Surgical History  Procedure Laterality Date  . Ankle reconstruction    . Cesarean section      three  . Sp av dialysis shunt access existing *l* Left   . Abdominal hysterectomy     History reviewed. No pertinent family history. History  Substance Use Topics  . Smoking status: Former Smoker -- 10 years  . Smokeless tobacco: Not on file  . Alcohol Use: Yes     Comment: occasional   OB History   Grav Para Term Preterm Abortions TAB SAB Ect Mult Living   3 3 3       3      Review of Systems  Gastrointestinal: Positive for abdominal pain.  All other systems reviewed and are negative.    Allergies  Simvastatin  Home Medications   Current Outpatient Rx  Name  Route  Sig  Dispense  Refill  . b complex-vitamin c-folic acid (NEPHRO-VITE) 0.8 MG TABS   Oral   Take 0.8 mg by mouth daily.         . calcium acetate (PHOSLO) 667 MG capsule   Oral   Take 3  capsules (2,001 mg total) by mouth 3 (three) times daily with meals.   180 capsule   0   . dicyclomine (BENTYL) 20 MG tablet   Oral   Take 20 mg by mouth every 6 (six) hours.         . docusate sodium (COLACE) 50 MG capsule   Oral   Take by mouth 2 (two) times daily.         Marland Kitchen esomeprazole (NEXIUM) 40 MG capsule   Oral   Take 40 mg by mouth daily.         . fentaNYL (DURAGESIC - DOSED MCG/HR) 25 MCG/HR   Transdermal   Place 1 patch (25 mcg total) onto the skin every 3 (three) days.   5 patch   0   . HYDROcodone-acetaminophen (NORCO) 7.5-325 MG per tablet   Oral   Take 1 tablet by mouth every 6 (six) hours as needed for pain.         Marland Kitchen losartan (COZAAR) 50 MG tablet   Oral   Take 50 mg by mouth daily.         . Nutritional Supplements (FEEDING SUPPLEMENT, NEPRO CARB STEADY,) LIQD   Oral   Take 237 mLs by mouth 2 (two) times daily between meals.  0   . potassium chloride (K-DUR) 10 MEQ tablet   Oral   Take 10 mEq by mouth 2 (two) times daily.         . QUEtiapine (SEROQUEL) 50 MG tablet   Oral   Take 1 tablet (50 mg total) by mouth at bedtime.   30 tablet   0   . rOPINIRole (REQUIP) 0.5 MG tablet   Oral   Take 0.5 mg by mouth daily.         Marland Kitchen senna (SENOKOT) 8.6 MG TABS tablet   Oral   Take 1 tablet by mouth 2 (two) times daily.         Marland Kitchen zolpidem (AMBIEN) 10 MG tablet   Oral   Take 10 mg by mouth at bedtime as needed for sleep.          BP 183/113  Pulse 90  Temp(Src) 97.9 F (36.6 C) (Oral)  Resp 23  Ht 4\' 10"  (1.473 m)  Wt 123 lb 7.3 oz (56 kg)  BMI 25.81 kg/m2  SpO2 100% Physical Exam  Nursing note and vitals reviewed. Constitutional: She is oriented to person, place, and time. She appears well-developed and well-nourished.  Non-toxic appearance. No distress.  HENT:  Head: Normocephalic and atraumatic.  Eyes: Conjunctivae, EOM and lids are normal. Pupils are equal, round, and reactive to light.  Neck: Normal range of  motion. Neck supple. No tracheal deviation present. No mass present.  Cardiovascular: Normal rate, regular rhythm and normal heart sounds.  Exam reveals no gallop.   No murmur heard. Pulmonary/Chest: Effort normal and breath sounds normal. No stridor. No respiratory distress. She has no decreased breath sounds. She has no wheezes. She has no rhonchi. She has no rales.  Abdominal: Soft. Normal appearance and bowel sounds are normal. She exhibits no distension. There is tenderness in the right lower quadrant and left lower quadrant. There is no rigidity, no rebound, no guarding and no CVA tenderness.    Musculoskeletal: Normal range of motion. She exhibits no edema and no tenderness.  Neurological: She is alert and oriented to person, place, and time. She has normal strength. No cranial nerve deficit or sensory deficit. GCS eye subscore is 4. GCS verbal subscore is 5. GCS motor subscore is 6.  Skin: Skin is warm and dry. No abrasion and no rash noted.  Psychiatric: She has a normal mood and affect. Her speech is normal and behavior is normal.    ED Course  Procedures (including critical care time) Labs Review Labs Reviewed  CBC WITH DIFFERENTIAL  COMPREHENSIVE METABOLIC PANEL  LIPASE, BLOOD   Imaging Review No results found.  MDM  No diagnosis found. Pt given pain  meds and she feels better--acute abd series neg for free air, abd ct pending, dr. Read Drivers to dispo    Toy Baker, MD 10/20/12 [phone]

## 2012-10-21 ENCOUNTER — Encounter (HOSPITAL_COMMUNITY): Payer: Self-pay | Admitting: Radiology

## 2012-10-21 ENCOUNTER — Emergency Department (HOSPITAL_COMMUNITY): Payer: Medicare Other

## 2012-10-21 MED ORDER — IOHEXOL 300 MG/ML  SOLN
80.0000 mL | Freq: Once | INTRAMUSCULAR | Status: AC | PRN
  Administered 2012-10-21: 80 mL via INTRAVENOUS

## 2012-10-21 NOTE — ED Notes (Signed)
Pt going to CT

## 2012-10-21 NOTE — ED Provider Notes (Signed)
Nursing notes and vitals signs, including pulse oximetry, reviewed.  Summary of this visit's results, reviewed by myself:  Labs:  Results for orders placed during the hospital encounter of 10/20/12 (from the past 24 hour(s))  CBC WITH DIFFERENTIAL     Status: Abnormal   Collection Time    10/20/12  8:52 PM      Result Value Range   WBC 2.9 (*) 4.0 - 10.5 K/uL   RBC 3.98  3.87 - 5.11 MIL/uL   Hemoglobin 11.7 (*) 12.0 - 15.0 g/dL   HCT 16.1 (*) 09.6 - 04.5 %   MCV 88.2  78.0 - 100.0 fL   MCH 29.4  26.0 - 34.0 pg   MCHC 33.3  30.0 - 36.0 g/dL   RDW 40.9  81.1 - 91.4 %   Platelets 164  150 - 400 K/uL   Neutrophils Relative % 40 (*) 43 - 77 %   Neutro Abs 1.2 (*) 1.7 - 7.7 K/uL   Lymphocytes Relative 43  12 - 46 %   Lymphs Abs 1.2  0.7 - 4.0 K/uL   Monocytes Relative 15 (*) 3 - 12 %   Monocytes Absolute 0.4  0.1 - 1.0 K/uL   Eosinophils Relative 1  0 - 5 %   Eosinophils Absolute 0.0  0.0 - 0.7 K/uL   Basophils Relative 0  0 - 1 %   Basophils Absolute 0.0  0.0 - 0.1 K/uL  COMPREHENSIVE METABOLIC PANEL     Status: Abnormal   Collection Time    10/20/12  8:52 PM      Result Value Range   Sodium 142  135 - 145 mEq/L   Potassium 3.5  3.5 - 5.1 mEq/L   Chloride 98  96 - 112 mEq/L   CO2 26  19 - 32 mEq/L   Glucose, Bld 62 (*) 70 - 99 mg/dL   BUN 14  6 - 23 mg/dL   Creatinine, Ser 7.82 (*) 0.50 - 1.10 mg/dL   Calcium 9.5  8.4 - 95.6 mg/dL   Total Protein 7.9  6.0 - 8.3 g/dL   Albumin 3.5  3.5 - 5.2 g/dL   AST 17  0 - 37 U/L   ALT 9  0 - 35 U/L   Alkaline Phosphatase 99  39 - 117 U/L   Total Bilirubin 0.4  0.3 - 1.2 mg/dL   GFR calc non Af Amer 11 (*) >90 mL/min   GFR calc Af Amer 13 (*) >90 mL/min  LIPASE, BLOOD     Status: None   Collection Time    10/20/12  8:52 PM      Result Value Range   Lipase 29  11 - 59 U/L  CG4 I-STAT (LACTIC ACID)     Status: None   Collection Time    10/20/12  9:03 PM      Result Value Range   Lactic Acid, Venous 1.62  0.5 - 2.2 mmol/L     Imaging Studies: Ct Abdomen Pelvis W Contrast  10/21/2012   CLINICAL DATA:  Chronic abdominal pain.  EXAM: CT ABDOMEN AND PELVIS WITH CONTRAST  TECHNIQUE: Multidetector CT imaging of the abdomen and pelvis was performed using the standard protocol following bolus administration of intravenous contrast.  CONTRAST:  80mL OMNIPAQUE IOHEXOL 300 MG/ML  SOLN  COMPARISON:  Plain films 10/20/2012. CT 08/06/2012.  FINDINGS: Cardiomegaly. Branching tree-in-bud densities in the right middle lobe peripherally, likely small airways disease. This area was not imaged on prior study. No  effusions.  Liver, gallbladder, spleen, adrenals are unremarkable. Mildly prominent pancreatic duct without visible focal pancreatic abnormality. Kidneys are severely atrophic compatible with chronic end-stage renal disease. Prior hysterectomy. No adnexal masses. Urinary bladder is decompressed.  Large and small bowel grossly unremarkable. Aorta and iliac vessels are tortuous and calcified, non aneurysmal. No acute bony abnormality. Severe degenerative changes at the T10-11 level in the area of previously presumed discitis.  IMPRESSION: No acute findings in the abdomen or pelvis.  Cardiomegaly.   Electronically Signed   By: Charlett Nose M.D.   On: 10/21/2012 01:54   Dg Abd Acute W/chest  10/20/2012   CLINICAL DATA:  Abdominal pain.  EXAM: ACUTE ABDOMEN SERIES (ABDOMEN 2 VIEW & CHEST 1 VIEW)  COMPARISON:  09/24/2012.  FINDINGS: The upright chest x-ray demonstrates stable cardiac enlargement and tortuosity and ectasia of the thoracic aorta. There are chronic lung changes but no definite acute overlying pulmonary process.  Two views of the abdomen demonstrate contrast in the stomach and small bowel. No distention or air-fluid levels. The soft tissue shadows are maintained. Extensive surgical changes are noted. The bony structures are intact.  IMPRESSION: Chronic lung changes but no acute overlying pulmonary process.  No plain film findings  for an acute abdominal process.   Electronically Signed   By: Loralie Champagne M.D.   On: 10/20/2012 22:54    3:20 AM Patient advised of CT findings. Abdomen is soft and nontender this time. She states she has a pain patch and hydrocodone at home as needed.  Hanley Seamen, MD 10/21/12 [phone]

## 2012-10-21 NOTE — ED Notes (Signed)
Went in room with Dr Read Drivers to discuss results of exam. Pt stated understanding. Then discussed transportation options for her to return home. Pt agreed to a cab.

## 2012-11-09 ENCOUNTER — Ambulatory Visit: Payer: Medicare Other | Admitting: Internal Medicine

## 2012-11-23 ENCOUNTER — Ambulatory Visit: Payer: Medicare Other | Admitting: Internal Medicine

## 2013-03-08 ENCOUNTER — Ambulatory Visit: Payer: Self-pay | Admitting: Podiatrist

## 2013-03-15 ENCOUNTER — Ambulatory Visit: Payer: Self-pay | Admitting: Podiatrist

## 2013-03-24 ENCOUNTER — Ambulatory Visit: Payer: Self-pay

## 2013-03-29 ENCOUNTER — Encounter: Payer: Self-pay | Admitting: Podiatrist

## 2013-03-29 ENCOUNTER — Ambulatory Visit (INDEPENDENT_AMBULATORY_CARE_PROVIDER_SITE_OTHER): Payer: Medicare Other | Admitting: Podiatrist

## 2013-03-29 VITALS — BP 190/107 | HR 79 | Resp 18

## 2013-03-29 DIAGNOSIS — G2581 Restless legs syndrome: Secondary | ICD-10-CM

## 2013-03-29 DIAGNOSIS — B351 Tinea unguium: Secondary | ICD-10-CM

## 2013-03-29 DIAGNOSIS — M79609 Pain in unspecified limb: Secondary | ICD-10-CM

## 2013-03-29 NOTE — Patient Instructions (Signed)
Dr. Edman Circleaj Gupta-- he is an internal medicine and Gasteroenterologist.. He may be able to help with your stomach issues  [facility] [address]  [place] [id number]  [phone]

## 2013-03-29 NOTE — Progress Notes (Signed)
   Subjective:    Patient ID: Julia EdwardsBarbara Small, female    DOB: 1935/05/11, 78 y.o.   MRN: [id number]  HPI I am a dialysis patient and I go three times a week and I do fall some and I do get off balance some and I need my toenails trimmed and burning and throbbing and numbness and tingling some     Review of Systems  Constitutional: Positive for fatigue.  HENT: Positive for sinus pressure.   Eyes: Positive for itching.  Respiratory: Positive for chest tightness, shortness of breath and wheezing.        Difficulty breathing  Cardiovascular: Positive for chest pain and palpitations.       Restless leg syndrome  Gastrointestinal: Positive for nausea, abdominal pain, diarrhea and constipation.  Endocrine: Positive for cold intolerance.       Excessive thirst  Musculoskeletal: Positive for gait problem.       Joint pain  Skin: Positive for color change.       Open sores and change in nails  Neurological: Positive for dizziness, weakness and headaches.  Hematological: Bruises/bleeds easily.  Psychiatric/Behavioral: The patient is nervous/anxious.   All other systems reviewed and are negative.       Objective:   Physical Exam Objective:   Vascular status reveals pedal pulses noted at  1 out of 4 dp and pt bilateral .  Neurological sensation is Decreased to [facility] monofilament bilateral at 2/5 bilateral.  Dermatological exam reveals  absence of pre ulcerative/ hyperkeratotic lesions.   Toenails are elongated, incurvated, discolored, dystrophic with ingrown deformity present especially bilateral hallux nails    Patient has restless leg syndrome and is having difficulty today on her left greater than right legs. She's also on dialysis 3 days a week.       Assessment & Plan:  Mycotic, symptomatic toenails  Plan: Toenails were debrided today without complication. She did have some difficulty on the left foot due to her restless leg syndrome at today's visit however we managed to  carry out the service without complication. She'll be seen back for routine care every 3-4 months and if she has any problems or concerns in the meantime she will call. She also states she's been having some stomach issues therefore recommended that she call Dr. Chales AbrahamsGupta if they do not resolve.

## 2013-06-28 ENCOUNTER — Ambulatory Visit: Payer: Medicare Other | Admitting: Podiatrist

## 2013-08-22 ENCOUNTER — Encounter (HOSPITAL_COMMUNITY): Payer: Self-pay | Admitting: Emergency Medicine

## 2013-08-22 ENCOUNTER — Emergency Department (HOSPITAL_COMMUNITY): Payer: Medicare Other

## 2013-08-22 ENCOUNTER — Emergency Department (HOSPITAL_COMMUNITY)
Admission: EM | Admit: 2013-08-22 | Discharge: 2013-08-23 | Disposition: A | Discharge status: Home / Self Care | Payer: Medicare Other | Attending: Emergency Medicine | Admitting: Emergency Medicine

## 2013-08-22 DIAGNOSIS — M545 Low back pain, unspecified: Secondary | ICD-10-CM | POA: Diagnosis not present

## 2013-08-22 DIAGNOSIS — G8929 Other chronic pain: Secondary | ICD-10-CM | POA: Diagnosis not present

## 2013-08-22 DIAGNOSIS — Z8669 Personal history of other diseases of the nervous system and sense organs: Secondary | ICD-10-CM | POA: Insufficient documentation

## 2013-08-22 DIAGNOSIS — M129 Arthropathy, unspecified: Secondary | ICD-10-CM | POA: Diagnosis not present

## 2013-08-22 DIAGNOSIS — Z90710 Acquired absence of both cervix and uterus: Secondary | ICD-10-CM | POA: Diagnosis not present

## 2013-08-22 DIAGNOSIS — Z992 Dependence on renal dialysis: Secondary | ICD-10-CM | POA: Diagnosis not present

## 2013-08-22 DIAGNOSIS — K219 Gastro-esophageal reflux disease without esophagitis: Secondary | ICD-10-CM | POA: Diagnosis not present

## 2013-08-22 DIAGNOSIS — R109 Unspecified abdominal pain: Secondary | ICD-10-CM | POA: Insufficient documentation

## 2013-08-22 DIAGNOSIS — N186 End stage renal disease: Secondary | ICD-10-CM | POA: Insufficient documentation

## 2013-08-22 DIAGNOSIS — N2581 Secondary hyperparathyroidism of renal origin: Secondary | ICD-10-CM | POA: Diagnosis not present

## 2013-08-22 DIAGNOSIS — Z79899 Other long term (current) drug therapy: Secondary | ICD-10-CM | POA: Diagnosis not present

## 2013-08-22 DIAGNOSIS — K648 Other hemorrhoids: Secondary | ICD-10-CM | POA: Insufficient documentation

## 2013-08-22 DIAGNOSIS — Z87891 Personal history of nicotine dependence: Secondary | ICD-10-CM | POA: Insufficient documentation

## 2013-08-22 DIAGNOSIS — R112 Nausea with vomiting, unspecified: Secondary | ICD-10-CM | POA: Insufficient documentation

## 2013-08-22 DIAGNOSIS — K5900 Constipation, unspecified: Secondary | ICD-10-CM | POA: Diagnosis not present

## 2013-08-22 DIAGNOSIS — Z8673 Personal history of transient ischemic attack (TIA), and cerebral infarction without residual deficits: Secondary | ICD-10-CM | POA: Diagnosis not present

## 2013-08-22 DIAGNOSIS — I120 Hypertensive chronic kidney disease with stage 5 chronic kidney disease or end stage renal disease: Secondary | ICD-10-CM | POA: Insufficient documentation

## 2013-08-22 DIAGNOSIS — Z9889 Other specified postprocedural states: Secondary | ICD-10-CM | POA: Diagnosis not present

## 2013-08-22 DIAGNOSIS — K5730 Diverticulosis of large intestine without perforation or abscess without bleeding: Secondary | ICD-10-CM | POA: Diagnosis not present

## 2013-08-22 LAB — CBC WITH DIFFERENTIAL/PLATELET
Basophils Absolute: 0 10*3/uL (ref 0.0–0.1)
Basophils Relative: 0 % (ref 0–1)
EOS ABS: 0.2 10*3/uL (ref 0.0–0.7)
EOS PCT: 4 % (ref 0–5)
HEMATOCRIT: 38.2 % (ref 36.0–46.0)
HEMOGLOBIN: 12.4 g/dL (ref 12.0–15.0)
LYMPHS ABS: 0.8 10*3/uL (ref 0.7–4.0)
LYMPHS PCT: 22 % (ref 12–46)
MCH: 29.1 pg (ref 26.0–34.0)
MCHC: 32.5 g/dL (ref 30.0–36.0)
MCV: 89.7 fL (ref 78.0–100.0)
MONOS PCT: 10 % (ref 3–12)
Monocytes Absolute: 0.4 10*3/uL (ref 0.1–1.0)
Neutro Abs: 2.4 10*3/uL (ref 1.7–7.7)
Neutrophils Relative %: 64 % (ref 43–77)
Platelets: 139 10*3/uL — ABNORMAL LOW (ref 150–400)
RBC: 4.26 MIL/uL (ref 3.87–5.11)
RDW: 16.9 % — ABNORMAL HIGH (ref 11.5–15.5)
WBC: 3.7 10*3/uL — AB (ref 4.0–10.5)

## 2013-08-22 LAB — COMPREHENSIVE METABOLIC PANEL
ALT: 17 U/L (ref 0–35)
ANION GAP: 21 — AB (ref 5–15)
AST: 23 U/L (ref 0–37)
Albumin: 4.3 g/dL (ref 3.5–5.2)
Alkaline Phosphatase: 146 U/L — ABNORMAL HIGH (ref 39–117)
BUN: 17 mg/dL (ref 6–23)
CALCIUM: 9.2 mg/dL (ref 8.4–10.5)
CO2: 26 mEq/L (ref 19–32)
Chloride: 93 mEq/L — ABNORMAL LOW (ref 96–112)
Creatinine, Ser: 3.29 mg/dL — ABNORMAL HIGH (ref 0.50–1.10)
GFR calc non Af Amer: 13 mL/min — ABNORMAL LOW (ref >90)
GFR, EST AFRICAN AMERICAN: 15 mL/min — AB (ref >90)
GLUCOSE: 85 mg/dL (ref 70–99)
Potassium: 3.4 mEq/L — ABNORMAL LOW (ref 3.7–5.3)
Sodium: 140 mEq/L (ref 137–147)
Total Bilirubin: 0.4 mg/dL (ref 0.3–1.2)
Total Protein: 9.2 g/dL — ABNORMAL HIGH (ref 6.0–8.3)

## 2013-08-22 LAB — POC OCCULT BLOOD, ED: Fecal Occult Bld: NEGATIVE

## 2013-08-22 LAB — LIPASE, BLOOD: Lipase: 30 U/L (ref 11–59)

## 2013-08-22 LAB — I-STAT TROPONIN, ED: TROPONIN I, POC: 0.03 ng/mL (ref 0.00–0.08)

## 2013-08-22 LAB — TROPONIN I: Troponin I: <0.3 ng/mL (ref <0.30)

## 2013-08-22 LAB — I-STAT CG4 LACTIC ACID, ED: LACTIC ACID, VENOUS: 1.58 mmol/L (ref 0.5–2.2)

## 2013-08-22 MED ORDER — HYDROMORPHONE HCL PF 1 MG/ML IJ SOLN
1.0000 mg | Freq: Once | INTRAMUSCULAR | Status: AC
  Administered 2013-08-22: 1 mg via INTRAVENOUS
  Filled 2013-08-22: qty 1

## 2013-08-22 MED ORDER — IOHEXOL 350 MG/ML SOLN
100.0000 mL | Freq: Once | INTRAVENOUS | Status: AC | PRN
  Administered 2013-08-22: 100 mL via INTRAVENOUS

## 2013-08-22 MED ORDER — LOSARTAN POTASSIUM 50 MG PO TABS
50.0000 mg | ORAL_TABLET | Freq: Every day | ORAL | Status: DC
  Administered 2013-08-22: 50 mg via ORAL
  Filled 2013-08-22: qty 1

## 2013-08-22 MED ORDER — CARVEDILOL 12.5 MG PO TABS
12.5000 mg | ORAL_TABLET | Freq: Two times a day (BID) | ORAL | Status: DC
  Administered 2013-08-22: 12.5 mg via ORAL
  Filled 2013-08-22 (×2): qty 1

## 2013-08-22 MED ORDER — ONDANSETRON HCL 4 MG/2ML IJ SOLN
4.0000 mg | Freq: Once | INTRAMUSCULAR | Status: AC
  Administered 2013-08-22: 4 mg via INTRAVENOUS
  Filled 2013-08-22: qty 2

## 2013-08-22 MED ORDER — OXYCODONE-ACETAMINOPHEN 5-325 MG PO TABS
1.0000 | ORAL_TABLET | Freq: Once | ORAL | Status: AC
  Administered 2013-08-22: 1 via ORAL
  Filled 2013-08-22: qty 1

## 2013-08-22 MED ORDER — CLONIDINE HCL 0.1 MG PO TABS
0.1000 mg | ORAL_TABLET | Freq: Every day | ORAL | Status: DC
  Administered 2013-08-22: 0.1 mg via ORAL
  Filled 2013-08-22: qty 1

## 2013-08-22 NOTE — ED Notes (Addendum)
Pt. Has been seen multiple times in the past for the same pain, states "they never find anything". Pt. Agitated. States "I can't concentrate on anything because I'm in so much pain". Dialysis patient, states has not been able to get full dialysis because she has been in too much pain.

## 2013-08-22 NOTE — ED Notes (Signed)
Pt reports lower abdominal pain that radiates to back and reports pain has been going on for 6 months everyday. Pt was at dialysis and pain became so severe that they stopped dialysis.  Pt reports last bm 3 days ago.  Pt in a lot of pain and bp 220/133

## 2013-08-22 NOTE — ED Provider Notes (Signed)
CSN: [id number]     Arrival date & time 08/22/13  1617 History   First MD Initiated Contact with Patient 08/22/13 1732     Chief Complaint  Patient presents with  . Abdominal Pain  . Back Pain     (Consider location/radiation/quality/duration/timing/severity/associated sxs/prior Treatment) HPI Comments:  Patient presents with lower abdominal pain radiating to her back that has been constant she states the past 6 months. It is moderately relieved by Vicodin she's been taking at home but the pain is otherwise constant. She reports 3 episodes of vomiting this week. Last bowel movement 3 days ago. She was at dialysis today and he stopped early because she was having more abdominal pain. She has had multiple evaluations in the past including CT scan on July 31 at [facility] and was told there is nothing wrong. She requests an MRI of her abdomen. She denies any fever. She does not make urine. She has been on dialysis for 16 years. Denies any blood in her stool. Denies any chest pain or shortness of breath. She is hypertensive but did not take her medications last night. She was treated for osteomyelitis/discitis of her T spine last year and completed a course of antibiotics. Denies any fevers, focal weakness, numbness, or tingling.  The history is provided by the patient.    Past Medical History  Diagnosis Date  . Renal disorder   . Hypertension   . GERD (gastroesophageal reflux disease)   . Arthritis   . Renal failure   . CVA (cerebral infarction)     Left side weakness and facial droop  . Carpal tunnel syndrome   . Secondary hyperparathyroidism (of renal origin)   . Renal insufficiency    Past Surgical History  Procedure Laterality Date  . Ankle reconstruction    . Cesarean section      three  . Sp av dialysis shunt access existing *l* Left   . Abdominal hysterectomy     No family history on file. History  Substance Use Topics  . Smoking status: Former Smoker -- 10 years  .  Smokeless tobacco: Never Used  . Alcohol Use: Yes     Comment: occasional   OB History   Grav Para Term Preterm Abortions TAB SAB Ect Mult Living   3 3 3       3      Review of Systems  Constitutional: Positive for activity change. Negative for fever and fatigue.  Respiratory: Negative for cough and stridor.   Cardiovascular: Negative for chest pain.  Gastrointestinal: Positive for nausea, vomiting, abdominal pain and constipation. Negative for blood in stool.  Genitourinary: Negative for dysuria, hematuria, vaginal bleeding and vaginal discharge.  Musculoskeletal: Positive for back pain. Negative for neck pain and neck stiffness.  Neurological: Negative for dizziness, weakness and headaches.  A complete 10 system review of systems was obtained and all systems are negative except as noted in the HPI and PMH.      Allergies  Simvastatin  Home Medications   Prior to Admission medications   Medication Sig Start Date End Date Taking? Authorizing Provider  amLODipine (NORVASC) 10 MG tablet Take 10 mg by mouth daily. 08/16/13  Yes Historical Provider, MD  calcium acetate (PHOSLO) 667 MG capsule Take 3 capsules (2,001 mg total) by mouth 3 (three) times daily with meals. 08/10/12  Yes Renae Fickle, MD  carvedilol (COREG) 12.5 MG tablet Take 12.5 mg by mouth 2 (two) times daily with a meal.  12/20/12  Yes  Historical Provider, MD  cloNIDine (CATAPRES) 0.1 MG tablet Take 0.1 mg by mouth daily.  12/20/12  Yes Historical Provider, MD  esomeprazole (NEXIUM) 40 MG capsule Take 40 mg by mouth daily.   Yes Historical Provider, MD  fentaNYL (DURAGESIC - DOSED MCG/HR) 25 MCG/HR patch Place 25 mcg onto the skin every 3 (three) days.  02/08/13  Yes Historical Provider, MD  HYDROcodone-acetaminophen (NORCO) 7.5-325 MG per tablet Take 1 tablet by mouth every 6 (six) hours as needed for severe pain.  12/22/12  Yes Historical Provider, MD  losartan (COZAAR) 50 MG tablet Take 50 mg by mouth at bedtime.    Yes  Historical Provider, MD  multivitamin (RENA-VIT) TABS tablet Take 1 tablet by mouth at bedtime.   Yes Historical Provider, MD  ondansetron (ZOFRAN-ODT) 4 MG disintegrating tablet Take 4 mg by mouth every 8 (eight) hours as needed for vomiting.  01/24/13  Yes Historical Provider, MD  polyethylene glycol (MIRALAX / GLYCOLAX) packet Take 17 g by mouth daily as needed for severe constipation.   Yes Historical Provider, MD  rOPINIRole (REQUIP) 0.5 MG tablet Take 0.5 mg by mouth daily.   Yes Historical Provider, MD  senna (SENOKOT) 8.6 MG TABS tablet Take 2 tablets by mouth 2 (two) times daily.    Yes Historical Provider, MD  sorbitol 70 % solution Take 15 mLs by mouth daily as needed (for constipation).   Yes Historical Provider, MD  zolpidem (AMBIEN) 10 MG tablet Take 10 mg by mouth at bedtime as needed for sleep.   Yes Historical Provider, MD  dicyclomine (BENTYL) 20 MG tablet Take 20 mg by mouth every 6 (six) hours.    Historical Provider, MD  oxyCODONE (ROXICODONE) 5 MG immediate release tablet Take 1 tablet (5 mg total) by mouth every 4 (four) hours as needed for severe pain. 08/23/13   Glynn Octave, MD  SENSIPAR 30 MG tablet Take 30 mg by mouth daily. 08/11/13   Historical Provider, MD   BP 156/94  Pulse 72  Temp(Src) 97.7 F (36.5 C) (Oral)  Resp 15  SpO2 95% Physical Exam  Nursing note and vitals reviewed. Constitutional: She is oriented to person, place, and time. She appears well-developed and well-nourished. She appears distressed.  Uncomfortable.  HENT:  Head: Normocephalic and atraumatic.  Mouth/Throat: Oropharynx is clear and moist. No oropharyngeal exudate.  Eyes: Conjunctivae and EOM are normal. Pupils are equal, round, and reactive to light.  Neck: Normal range of motion. Neck supple.  No meningismus.  Cardiovascular: Normal rate, regular rhythm, normal heart sounds and intact distal pulses.   No murmur heard. Pulmonary/Chest: Effort normal and breath sounds normal. No  respiratory distress.  Abdominal: Soft. There is tenderness. There is no rebound and no guarding.  Midline incision, well healed Diffuse tenderness, no peritoneal signs.  Genitourinary:  No external hemorrhoids, brown stool, no fecal impaction, chaperone present  Musculoskeletal: Normal range of motion. She exhibits no edema and no tenderness.  Paraspinal lumbar tenderness without significant midline tenderness  Neurological: She is alert and oriented to person, place, and time. No cranial nerve deficit. She exhibits normal muscle tone. Coordination normal.  5/5 strength in bilateral lower extremities. Ankle plantar and dorsiflexion intact. Great toe extension intact bilaterally. +2 DP and PT pulses. +2 patellar reflexes bilaterally. Normal gait.   Skin: Skin is warm.  Psychiatric: She has a normal mood and affect. Her behavior is normal.    ED Course  Procedures (including critical care time) Labs Review Labs Reviewed  CBC WITH DIFFERENTIAL - Abnormal; Notable for the following:    WBC 3.7 (*)    RDW 16.9 (*)    Platelets 139 (*)    All other components within normal limits  COMPREHENSIVE METABOLIC PANEL - Abnormal; Notable for the following:    Potassium 3.4 (*)    Chloride 93 (*)    Creatinine, Ser 3.29 (*)    Total Protein 9.2 (*)    Alkaline Phosphatase 146 (*)    GFR calc non Af Amer 13 (*)    GFR calc Af Amer 15 (*)    Anion gap 21 (*)    All other components within normal limits  LIPASE, BLOOD  TROPONIN I  I-STAT TROPOININ, ED  I-STAT CG4 LACTIC ACID, ED  POC OCCULT BLOOD, ED    Imaging Review Dg Abd Acute W/chest  08/22/2013   CLINICAL DATA:  Abdominal pain  EXAM: ACUTE ABDOMEN SERIES (ABDOMEN 2 VIEW & CHEST 1 VIEW)  COMPARISON:  Chest radiograph August 06, 2012; abdominal radiographs August 15, 2013  FINDINGS: PA chest: There is underlying interstitial fibrosis with scattered areas of scarring. There is no frank edema or consolidation. Heart is mildly enlarged with  pulmonary vascularity within normal limits. No adenopathy. Aorta is prominent but stable.  Supine and upright abdomen: There is contrast in the colon. Bowel gas pattern is overall unremarkable. No obstruction or free air. There are multiple surgical clips and wires in the lower abdomen and pelvis.  IMPRESSION: Bowel gas pattern unremarkable. Lungs show evidence of interstitial fibrosis and scarring without frank edema or consolidation. Stable cardiac enlargement.   Electronically Signed   By: Bretta BangWilliam  Woodruff M.D.   On: 08/22/2013 21:07   Ct Angio Chest Aortic Dissect W &/or W/o  08/23/2013   CLINICAL DATA:  Back and abdominal pain  EXAM: CT ANGIOGRAPHY CHEST, ABDOMEN AND PELVIS  TECHNIQUE: Multidetector CT imaging through the chest, abdomen and pelvis was performed using the standard protocol during bolus administration of intravenous contrast. Multiplanar reconstructed images and MIPs were obtained and reviewed to evaluate the vascular anatomy.  CONTRAST:  100mL OMNIPAQUE IOHEXOL 350 MG/ML SOLN, 100mL OMNIPAQUE IOHEXOL 350 MG/ML SOLN  COMPARISON:  08/19/2013  FINDINGS: CTA CHEST FINDINGS  THORACIC INLET/BODY WALL:  Anasarca. Markedly enlarged, tortuous, and early filling veins in the left upper extremity related to dialysis fistula or graft. There is venous narrowing at the level of the first rib crossing which could be positional. Left axillary adenopathy which could be congestive. No gross mass lesion in the left breast.  MEDIASTINUM:  Cardiomegaly. There is extensive atherosclerosis in the elongation of the aorta. No intramural hematoma, dissection, or aneurysm.  No adenopathy.  Negative esophagus.  No evidence of central pulmonary embolism. Main pulmonary arteries are enlarged compatible with pulmonary hypertension.  LUNG WINDOWS:  There is a persistent nodular opacity in the lateral segment right middle lobe measuring 15 mm in maximal diameter. Capacity has coal less since 02/03/2013.  OSSEOUS:  No acute  fracture.  No suspicious lytic or blastic lesions.  Review of the MIP images confirms the above findings.  CTA ABDOMEN AND PELVIS FINDINGS  BODY WALL: Anasarca.  Liver: No focal abnormality.  Biliary: No evidence of biliary obstruction or stone.  Pancreas: The main pancreatic duct remains chronically dilated up to 4 mm. No gross mass lesion or change from 05/07/2013.  Spleen: Unremarkable.  Adrenals: Unremarkable.  Kidneys and ureters: Atrophic and multi cystic kidneys compatible with dialysis related polycystic kidney disease.  Bladder: Decompressed.  Reproductive: Hysterectomy.  Bowel: Distal colonic diverticulosis. No bowel obstruction.  Retroperitoneum: No mass or adenopathy.  Peritoneum: No ascites or pneumoperitoneum.  Vascular: No aortic aneurysm or dissection. No major vessel stenosis or occlusion.  OSSEOUS: There is renal osteodystrophy with diffuse trabecular coarsening, subchondral erosions, and osteopenia. No change in extensive bone loss, compression deformity, and subchondral erosions at centered at the T10-11 disc. Osteophyte formation and anterolisthesis causes advanced canal stenosis at this level. Erosive changes to both facet joints at this level could be inflammatory or from abnormal motion. This could represent dialysis related spondyloarthropathy or infectious discitis/osteomyelitis. Noted that the patient has undergone empiric antibiotic treatment. Stable subchondral erosive changes at L4-5.  Review of the MIP images confirms the above findings.  IMPRESSION: 1. Negative for aortic dissection. 2. Unchanged appearance of discitis at T10-11. Bone destruction and probable instability causes advanced spinal canal stenosis at this level. 3. 15 mm nodule in the right middle lobe which his gradually increased in size/density. Neoplastic nodule is a diagnostic possibility, consider six-month follow-up. 4. Multiple chronic/incidental findings are stable from prior and noted above.   Electronically  Signed   By: Tiburcio Pea M.D.   On: 08/23/2013 00:23   Ct Cta Abd/pel W/cm &/or W/o Cm  08/23/2013   CLINICAL DATA:  Back and abdominal pain  EXAM: CT ANGIOGRAPHY CHEST, ABDOMEN AND PELVIS  TECHNIQUE: Multidetector CT imaging through the chest, abdomen and pelvis was performed using the standard protocol during bolus administration of intravenous contrast. Multiplanar reconstructed images and MIPs were obtained and reviewed to evaluate the vascular anatomy.  CONTRAST:  OMNIPAQUE IOHEXOL 350 MG/ML SOLN, OMNIPAQUE IOHEXOL 350 MG/ML SOLN  COMPARISON:  08/19/2013  FINDINGS: CTA CHEST FINDINGS  THORACIC INLET/BODY WALL:  Anasarca. Markedly enlarged, tortuous, and early filling veins in the left upper extremity related to dialysis fistula or graft. There is venous narrowing at the level of the first rib crossing which could be positional. Left axillary adenopathy which could be congestive. No gross mass lesion in the left breast.  MEDIASTINUM:  Cardiomegaly. There is extensive atherosclerosis in the elongation of the aorta. No intramural hematoma, dissection, or aneurysm.  No adenopathy.  Negative esophagus.  No evidence of central pulmonary embolism. Main pulmonary arteries are enlarged compatible with pulmonary hypertension.  LUNG WINDOWS:  There is a persistent nodular opacity in the lateral segment right middle lobe measuring 15 mm in maximal diameter. Capacity has coal less since 02/03/2013.  OSSEOUS:  No acute fracture.  No suspicious lytic or blastic lesions.  Review of the MIP images confirms the above findings.  CTA ABDOMEN AND PELVIS FINDINGS  BODY WALL: Anasarca.  Liver: No focal abnormality.  Biliary: No evidence of biliary obstruction or stone.  Pancreas: The main pancreatic duct remains chronically dilated up to 4 mm. No gross mass lesion or change from 05/07/2013.  Spleen: Unremarkable.  Adrenals: Unremarkable.  Kidneys and ureters: Atrophic and multi cystic kidneys compatible with  dialysis related polycystic kidney disease.  Bladder: Decompressed.  Reproductive: Hysterectomy.  Bowel: Distal colonic diverticulosis. No bowel obstruction.  Retroperitoneum: No mass or adenopathy.  Peritoneum: No ascites or pneumoperitoneum.  Vascular: No aortic aneurysm or dissection. No major vessel stenosis or occlusion.  OSSEOUS: There is renal osteodystrophy with diffuse trabecular coarsening, subchondral erosions, and osteopenia. No change in extensive bone loss, compression deformity, and subchondral erosions at centered at the T10-11 disc. Osteophyte formation and anterolisthesis causes advanced canal stenosis at this level. Erosive changes to both facet joints at  this level could be inflammatory or from abnormal motion. This could represent dialysis related spondyloarthropathy or infectious discitis/osteomyelitis. Noted that the patient has undergone empiric antibiotic treatment. Stable subchondral erosive changes at L4-5.  Review of the MIP images confirms the above findings.  IMPRESSION: 1. Negative for aortic dissection. 2. Unchanged appearance of discitis at T10-11. Bone destruction and probable instability causes advanced spinal canal stenosis at this level. 3. 15 mm nodule in the right middle lobe which his gradually increased in size/density. Neoplastic nodule is a diagnostic possibility, consider six-month follow-up. 4. Multiple chronic/incidental findings are stable from prior and noted above.   Electronically Signed   By: Tiburcio Pea M.D.   On: 08/23/2013 00:23     EKG Interpretation   Date/Time:  Monday August 22 2013 16:35:12 EDT Ventricular Rate:  87 PR Interval:  186 QRS Duration: 90 QT Interval:  404 QTC Calculation: 486 R Axis:   -36 Text Interpretation:  Sinus rhythm with Premature supraventricular  complexes Left axis deviation Abnormal ECG No significant change was found  Confirmed by Manus Gunning  MD, Swain Acree ([id number]) on 08/22/2013 5:28:39 PM      MDM   Final  diagnoses:  Chronic abdominal pain   Chronic lower abdominal pain with nausea and vomiting. Ongoing for the past 6 months with negative workups. Patient is hypertensive and did not complete dialysis today or take blood pressure medications last night.  Records obtained from [facility]. Patient had a CT scan without contrast on July 31 that showed no acute abnormalities. She has sigmoid colon diverticulosis.   Colonoscopy on 07/2008 showed diverticular disease with internal hemorrhoids.  EGD July 2010 showed gastritis and duodenitis.  Given history and recalcitrant nature of the pain, there is some concern for mesenteric ischemia and imaging with contrast will be pursued. Lactate normal. Labs otherwise normal baseline. Potassium 3.4  Blood pressure improved with pain medication at home medications.  No evidence of aortic dissection or blood vessel occlusion in the abdomen. No evidence of mesenteric ischemia. Area of discitis at T10, 11 has been constant and unchanged since 2014. Patient completed a 6 week course of antibiotics and cultures were negative per infectious disease notes.  Blood pressure has improved with her pain medications and blood pressure medications. She is reading a magazine comfortably. She is stable for outpatient followup with her doctor. Short course of pain medications provided.    BP 156/94  Pulse 72  Temp(Src) 97.7 F (36.5 C) (Oral)  Resp 15  SpO2 95%    Glynn Octave, MD 08/23/13 1022

## 2013-08-22 NOTE — ED Notes (Signed)
Pt is wanting a MRI of back, abdomen, and lump on left lateral abdomen.  Pt states pain radiates to the back.

## 2013-08-22 NOTE — ED Notes (Addendum)
Pt. Mentions that any drink makes her nauseous. Pt. Was able to take and keep pills down with water.

## 2013-08-23 ENCOUNTER — Encounter (HOSPITAL_COMMUNITY): Payer: Self-pay | Admitting: Emergency Medicine

## 2013-08-23 ENCOUNTER — Emergency Department (HOSPITAL_COMMUNITY)
Admission: EM | Admit: 2013-08-23 | Discharge: 2013-08-23 | Disposition: A | Discharge status: Home / Self Care | Payer: Medicare Other | Source: Home / Self Care | Attending: Emergency Medicine | Admitting: Emergency Medicine

## 2013-08-23 DIAGNOSIS — Z9889 Other specified postprocedural states: Secondary | ICD-10-CM | POA: Insufficient documentation

## 2013-08-23 DIAGNOSIS — R109 Unspecified abdominal pain: Principal | ICD-10-CM

## 2013-08-23 DIAGNOSIS — M549 Dorsalgia, unspecified: Secondary | ICD-10-CM

## 2013-08-23 DIAGNOSIS — Z8673 Personal history of transient ischemic attack (TIA), and cerebral infarction without residual deficits: Secondary | ICD-10-CM

## 2013-08-23 DIAGNOSIS — M129 Arthropathy, unspecified: Secondary | ICD-10-CM | POA: Insufficient documentation

## 2013-08-23 DIAGNOSIS — Z87891 Personal history of nicotine dependence: Secondary | ICD-10-CM

## 2013-08-23 DIAGNOSIS — G8929 Other chronic pain: Secondary | ICD-10-CM | POA: Insufficient documentation

## 2013-08-23 DIAGNOSIS — N2581 Secondary hyperparathyroidism of renal origin: Secondary | ICD-10-CM | POA: Insufficient documentation

## 2013-08-23 DIAGNOSIS — I10 Essential (primary) hypertension: Secondary | ICD-10-CM

## 2013-08-23 DIAGNOSIS — K219 Gastro-esophageal reflux disease without esophagitis: Secondary | ICD-10-CM | POA: Insufficient documentation

## 2013-08-23 DIAGNOSIS — Z8669 Personal history of other diseases of the nervous system and sense organs: Secondary | ICD-10-CM | POA: Insufficient documentation

## 2013-08-23 DIAGNOSIS — Z79899 Other long term (current) drug therapy: Secondary | ICD-10-CM | POA: Insufficient documentation

## 2013-08-23 MED ORDER — OXYCODONE HCL 5 MG PO TABS: 5.0000 mg | ORAL_TABLET | ORAL | Status: DC | PRN

## 2013-08-23 MED ORDER — OXYCODONE-ACETAMINOPHEN 5-325 MG PO TABS
2.0000 | ORAL_TABLET | Freq: Once | ORAL | Status: AC
  Administered 2013-08-23: 2 via ORAL
  Filled 2013-08-23: qty 2

## 2013-08-23 MED ORDER — OXYCODONE-ACETAMINOPHEN 5-325 MG PO TABS
1.0000 | ORAL_TABLET | Freq: Once | ORAL | Status: AC
  Administered 2013-08-23: 1 via ORAL
  Filled 2013-08-23: qty 1

## 2013-08-23 NOTE — ED Notes (Signed)
Pt states that the only reason she checked back in was to get another pain pill. Pt has not left the hospital since discharged last night, and has not gotten her prescriptions filled.

## 2013-08-23 NOTE — Discharge Instructions (Signed)
Follow the instructions given to you at the prior ED visit.

## 2013-08-23 NOTE — ED Provider Notes (Signed)
CSN: [id number]     Arrival date & time 08/23/13  0356 History   First MD Initiated Contact with Patient 08/23/13 [phone]     Chief Complaint  Patient presents with  . Abdominal Pain  . Back Pain     (Consider location/radiation/quality/duration/timing/severity/associated sxs/prior Treatment) Patient is a 78 y.o. female presenting with abdominal pain and back pain. The history is provided by the patient.  Abdominal Pain Back Pain Associated symptoms: abdominal pain   She has chronic abdominal pain and back pain and has been in the ED earlier and was discharged with a prescription for oxycodone. She has called for a ride but nobody has come to pick her up and she is having recurrent pain but has not been able to fill the prescription yet. She just wants something for pain until she can get the prescription refilled. She has no other new complaints.  Past Medical History  Diagnosis Date  . Renal disorder   . Hypertension   . GERD (gastroesophageal reflux disease)   . Arthritis   . Renal failure   . CVA (cerebral infarction)     Left side weakness and facial droop  . Carpal tunnel syndrome   . Secondary hyperparathyroidism (of renal origin)   . Renal insufficiency    Past Surgical History  Procedure Laterality Date  . Ankle reconstruction    . Cesarean section      three  . Sp av dialysis shunt access existing *l* Left   . Abdominal hysterectomy     No family history on file. History  Substance Use Topics  . Smoking status: Former Smoker -- 10 years  . Smokeless tobacco: Never Used  . Alcohol Use: Yes     Comment: occasional   OB History   Grav Para Term Preterm Abortions TAB SAB Ect Mult Living   3 3 3       3      Review of Systems  Gastrointestinal: Positive for abdominal pain.  Musculoskeletal: Positive for back pain.  All other systems reviewed and are negative.     Allergies  Simvastatin  Home Medications   Prior to Admission medications   Medication Sig  Start Date End Date Taking? Authorizing Provider  amLODipine (NORVASC) 10 MG tablet Take 10 mg by mouth daily. 08/16/13  Yes Historical Provider, MD  calcium acetate (PHOSLO) 667 MG capsule Take 3 capsules (2,001 mg total) by mouth 3 (three) times daily with meals. 08/10/12  Yes Renae FickleMackenzie Short, MD  carvedilol (COREG) 12.5 MG tablet Take 12.5 mg by mouth 2 (two) times daily with a meal.  12/20/12  Yes Historical Provider, MD  cloNIDine (CATAPRES) 0.1 MG tablet Take 0.1 mg by mouth daily.  12/20/12  Yes Historical Provider, MD  esomeprazole (NEXIUM) 40 MG capsule Take 40 mg by mouth daily.   Yes Historical Provider, MD  fentaNYL (DURAGESIC - DOSED MCG/HR) 25 MCG/HR patch Place 25 mcg onto the skin every 3 (three) days.  02/08/13  Yes Historical Provider, MD  HYDROcodone-acetaminophen (NORCO) 7.5-325 MG per tablet Take 1 tablet by mouth every 6 (six) hours as needed for severe pain.  12/22/12  Yes Historical Provider, MD  losartan (COZAAR) 50 MG tablet Take 50 mg by mouth at bedtime.    Yes Historical Provider, MD  multivitamin (RENA-VIT) TABS tablet Take 1 tablet by mouth at bedtime.   Yes Historical Provider, MD  ondansetron (ZOFRAN-ODT) 4 MG disintegrating tablet Take 4 mg by mouth every 8 (eight) hours as needed for  vomiting.  01/24/13  Yes Historical Provider, MD  polyethylene glycol (MIRALAX / GLYCOLAX) packet Take 17 g by mouth daily as needed for severe constipation.   Yes Historical Provider, MD  rOPINIRole (REQUIP) 0.5 MG tablet Take 0.5 mg by mouth daily.   Yes Historical Provider, MD  senna (SENOKOT) 8.6 MG TABS tablet Take 2 tablets by mouth 2 (two) times daily.    Yes Historical Provider, MD  sorbitol 70 % solution Take 15 mLs by mouth daily as needed (for constipation).   Yes Historical Provider, MD  zolpidem (AMBIEN) 10 MG tablet Take 10 mg by mouth at bedtime as needed for sleep.   Yes Historical Provider, MD  dicyclomine (BENTYL) 20 MG tablet Take 20 mg by mouth every 6 (six) hours.     Historical Provider, MD  oxyCODONE (ROXICODONE) 5 MG immediate release tablet Take 1 tablet (5 mg total) by mouth every 4 (four) hours as needed for severe pain. 08/23/13   Glynn Octave, MD  SENSIPAR 30 MG tablet Take 30 mg by mouth daily. 08/11/13   Historical Provider, MD   BP 163/92  Pulse 71  Temp(Src) 97.5 F (36.4 C) (Oral)  Resp 16  Ht 4\' 10"  (1.473 m)  Wt 115 lb (52.164 kg)  BMI 24.04 kg/m2  SpO2 100% Physical Exam  Nursing note and vitals reviewed.  78 year old female, resting comfortably and in no acute distress. Vital signs are significant for hypertension her blood pressure 163/92. Oxygen saturation is 100%, which is normal. Head is normocephalic and atraumatic. PERRLA, EOMI. Oropharynx is clear. Neck is nontender and supple without adenopathy or JVD. Back is mildly tender in the lumbar area. There is no CVA tenderness. Lungs are clear without rales, wheezes, or rhonchi. Chest is nontender. Heart has regular rate and rhythm without murmur. Abdomen is soft, flat, with mild tenderness diffusely. There are no masses or hepatosplenomegaly and peristalsis is normoactive. Extremities have no cyanosis or edema, full range of motion is present. Skin is warm and dry without rash. Neurologic: Mental status is normal, cranial nerves are intact, there are no motor or sensory deficits.  ED Course  Procedures (including critical care time)  MDM   Final diagnoses:  Chronic abdominal pain  Chronic back pain    Chronic abdominal pain and back pain. Old records are reviewed and she had complete workup just a few hours earlier and was prescribed oxycodone. She's given a dose of oxycodone and acetaminophen and discharged.    Dione Booze, MD 08/23/13 [phone]

## 2013-08-23 NOTE — Discharge Instructions (Signed)
Abdominal Pain Your CT scan appears stable. There is no evidence of blocked blood vessels. Follow up with your doctor. Return to the ED if you develop new or worsening symptoms. Many things can cause abdominal pain. Usually, abdominal pain is not caused by a disease and will improve without treatment. It can often be observed and treated at home. Your health care provider will do a physical exam and possibly order blood tests and X-rays to help determine the seriousness of your pain. However, in many cases, more time must pass before a clear cause of the pain can be found. Before that point, your health care provider may not know if you need more testing or further treatment. HOME CARE INSTRUCTIONS  Monitor your abdominal pain for any changes. The following actions may help to alleviate any discomfort you are experiencing:  Only take over-the-counter or prescription medicines as directed by your health care provider.  Do not take laxatives unless directed to do so by your health care provider.  Try a clear liquid diet (broth, tea, or water) as directed by your health care provider. Slowly move to a bland diet as tolerated. SEEK MEDICAL CARE IF:  You have unexplained abdominal pain.  You have abdominal pain associated with nausea or diarrhea.  You have pain when you urinate or have a bowel movement.  You experience abdominal pain that wakes you in the night.  You have abdominal pain that is worsened or improved by eating food.  You have abdominal pain that is worsened with eating fatty foods.  You have a fever. SEEK IMMEDIATE MEDICAL CARE IF:   Your pain does not go away within 2 hours.  You keep throwing up (vomiting).  Your pain is felt only in portions of the abdomen, such as the right side or the left lower portion of the abdomen.  You pass bloody or black tarry stools. MAKE SURE YOU:  Understand these instructions.   Will watch your condition.   Will get help right away  if you are not doing well or get worse.  Document Released: 10/16/2004 Document Revised: 01/11/2013 Document Reviewed: 09/15/2012 [facility] Patient Information 2015 [employer], [place]. This information is not intended to replace advice given to you by your health care provider. Make sure you discuss any questions you have with your health care provider.

## 2013-08-23 NOTE — ED Notes (Signed)
Pt. reports low abdominal /low back pain for 6 months , pt. Was just discharged here at ER , pt. stated pain recurred while waiting for her ride at waiting area.

## 2013-09-02 ENCOUNTER — Encounter (HOSPITAL_COMMUNITY): Payer: Self-pay | Admitting: General Practice

## 2013-09-02 ENCOUNTER — Inpatient Hospital Stay (HOSPITAL_COMMUNITY)
Admission: AD | Admit: 2013-09-02 | Discharge: 2013-09-09 | DRG: 551 | Disposition: A | Discharge status: Home Health | Payer: Medicare Other | Source: Other Acute Inpatient Hospital | Attending: Internal Medicine | Admitting: Internal Medicine

## 2013-09-02 ENCOUNTER — Inpatient Hospital Stay (HOSPITAL_COMMUNITY): Payer: Medicare Other

## 2013-09-02 DIAGNOSIS — M5124 Other intervertebral disc displacement, thoracic region: Principal | ICD-10-CM | POA: Diagnosis present

## 2013-09-02 DIAGNOSIS — M4804 Spinal stenosis, thoracic region: Secondary | ICD-10-CM

## 2013-09-02 DIAGNOSIS — M810 Age-related osteoporosis without current pathological fracture: Secondary | ICD-10-CM | POA: Diagnosis present

## 2013-09-02 DIAGNOSIS — L299 Pruritus, unspecified: Secondary | ICD-10-CM | POA: Diagnosis not present

## 2013-09-02 DIAGNOSIS — M899 Disorder of bone, unspecified: Secondary | ICD-10-CM | POA: Diagnosis present

## 2013-09-02 DIAGNOSIS — D649 Anemia, unspecified: Secondary | ICD-10-CM | POA: Diagnosis present

## 2013-09-02 DIAGNOSIS — Z87891 Personal history of nicotine dependence: Secondary | ICD-10-CM | POA: Diagnosis not present

## 2013-09-02 DIAGNOSIS — I330 Acute and subacute infective endocarditis: Secondary | ICD-10-CM

## 2013-09-02 DIAGNOSIS — R531 Weakness: Secondary | ICD-10-CM

## 2013-09-02 DIAGNOSIS — B029 Zoster without complications: Secondary | ICD-10-CM | POA: Diagnosis present

## 2013-09-02 DIAGNOSIS — M4644 Discitis, unspecified, thoracic region: Secondary | ICD-10-CM

## 2013-09-02 DIAGNOSIS — N2581 Secondary hyperparathyroidism of renal origin: Secondary | ICD-10-CM | POA: Diagnosis present

## 2013-09-02 DIAGNOSIS — N186 End stage renal disease: Secondary | ICD-10-CM | POA: Diagnosis present

## 2013-09-02 DIAGNOSIS — K219 Gastro-esophageal reflux disease without esophagitis: Secondary | ICD-10-CM | POA: Diagnosis present

## 2013-09-02 DIAGNOSIS — F411 Generalized anxiety disorder: Secondary | ICD-10-CM | POA: Diagnosis present

## 2013-09-02 DIAGNOSIS — M4640 Discitis, unspecified, site unspecified: Secondary | ICD-10-CM | POA: Diagnosis present

## 2013-09-02 DIAGNOSIS — Z992 Dependence on renal dialysis: Secondary | ICD-10-CM

## 2013-09-02 DIAGNOSIS — Z8673 Personal history of transient ischemic attack (TIA), and cerebral infarction without residual deficits: Secondary | ICD-10-CM

## 2013-09-02 DIAGNOSIS — D696 Thrombocytopenia, unspecified: Secondary | ICD-10-CM | POA: Diagnosis present

## 2013-09-02 DIAGNOSIS — R1030 Lower abdominal pain, unspecified: Secondary | ICD-10-CM

## 2013-09-02 DIAGNOSIS — M4000 Postural kyphosis, site unspecified: Secondary | ICD-10-CM | POA: Diagnosis present

## 2013-09-02 DIAGNOSIS — M949 Disorder of cartilage, unspecified: Secondary | ICD-10-CM | POA: Diagnosis present

## 2013-09-02 DIAGNOSIS — I120 Hypertensive chronic kidney disease with stage 5 chronic kidney disease or end stage renal disease: Secondary | ICD-10-CM | POA: Diagnosis present

## 2013-09-02 DIAGNOSIS — Z01818 Encounter for other preprocedural examination: Secondary | ICD-10-CM

## 2013-09-02 DIAGNOSIS — M549 Dorsalgia, unspecified: Secondary | ICD-10-CM | POA: Diagnosis present

## 2013-09-02 DIAGNOSIS — R109 Unspecified abdominal pain: Secondary | ICD-10-CM | POA: Diagnosis present

## 2013-09-02 DIAGNOSIS — G589 Mononeuropathy, unspecified: Secondary | ICD-10-CM | POA: Diagnosis present

## 2013-09-02 DIAGNOSIS — I10 Essential (primary) hypertension: Secondary | ICD-10-CM | POA: Diagnosis present

## 2013-09-02 DIAGNOSIS — S22080A Wedge compression fracture of T11-T12 vertebra, initial encounter for closed fracture: Secondary | ICD-10-CM

## 2013-09-02 DIAGNOSIS — R1084 Generalized abdominal pain: Secondary | ICD-10-CM

## 2013-09-02 HISTORY — DX: Headache: R51

## 2013-09-02 HISTORY — DX: Dependence on supplemental oxygen: Z99.81

## 2013-09-02 HISTORY — DX: Depression, unspecified: F32.A

## 2013-09-02 HISTORY — DX: Dependence on renal dialysis: Z99.2

## 2013-09-02 HISTORY — DX: Angina pectoris, unspecified: I20.9

## 2013-09-02 HISTORY — DX: End stage renal disease: N18.6

## 2013-09-02 HISTORY — DX: Major depressive disorder, single episode, unspecified: F32.9

## 2013-09-02 HISTORY — DX: Anemia, unspecified: D64.9

## 2013-09-02 HISTORY — DX: Personal history of other diseases of the digestive system: Z87.19

## 2013-09-02 HISTORY — DX: Anxiety disorder, unspecified: F41.9

## 2013-09-02 HISTORY — DX: Unspecified viral hepatitis C without hepatic coma: B19.20

## 2013-09-02 HISTORY — DX: Cerebral infarction, unspecified: I63.9

## 2013-09-02 HISTORY — DX: Pneumonia, unspecified organism: J18.9

## 2013-09-02 HISTORY — DX: Unspecified osteoarthritis, unspecified site: M19.90

## 2013-09-02 LAB — CBC
HCT: 36.4 % (ref 36.0–46.0)
HEMOGLOBIN: 11.4 g/dL — AB (ref 12.0–15.0)
MCH: 28.6 pg (ref 26.0–34.0)
MCHC: 31.3 g/dL (ref 30.0–36.0)
MCV: 91.2 fL (ref 78.0–100.0)
Platelets: 71 10*3/uL — ABNORMAL LOW (ref 150–400)
RBC: 3.99 MIL/uL (ref 3.87–5.11)
RDW: 16.4 % — ABNORMAL HIGH (ref 11.5–15.5)
WBC: 4.2 10*3/uL (ref 4.0–10.5)

## 2013-09-02 LAB — BASIC METABOLIC PANEL
Anion gap: 20 — ABNORMAL HIGH (ref 5–15)
BUN: 30 mg/dL — ABNORMAL HIGH (ref 6–23)
CO2: 19 meq/L (ref 19–32)
Calcium: 9.2 mg/dL (ref 8.4–10.5)
Chloride: 94 mEq/L — ABNORMAL LOW (ref 96–112)
Creatinine, Ser: 4.85 mg/dL — ABNORMAL HIGH (ref 0.50–1.10)
GFR calc Af Amer: 9 mL/min — ABNORMAL LOW (ref >90)
GFR calc non Af Amer: 8 mL/min — ABNORMAL LOW (ref >90)
GLUCOSE: 85 mg/dL (ref 70–99)
Potassium: 5.5 mEq/L — ABNORMAL HIGH (ref 3.7–5.3)
SODIUM: 133 meq/L — AB (ref 137–147)

## 2013-09-02 MED ORDER — CALCIUM ACETATE 667 MG PO CAPS
667.0000 mg | ORAL_CAPSULE | ORAL | Status: DC | PRN
  Filled 2013-09-02: qty 1

## 2013-09-02 MED ORDER — ONDANSETRON HCL 4 MG/2ML IJ SOLN
4.0000 mg | Freq: Four times a day (QID) | INTRAMUSCULAR | Status: DC | PRN
Start: 2013-09-02 — End: 2013-09-09
  Administered 2013-09-04: 4 mg via INTRAVENOUS
  Filled 2013-09-02: qty 2

## 2013-09-02 MED ORDER — RENA-VITE PO TABS
1.0000 | ORAL_TABLET | Freq: Every day | ORAL | Status: DC
  Administered 2013-09-02 – 2013-09-07 (×6): 1 via ORAL
  Administered 2013-09-08: 23:00:00 via ORAL
  Filled 2013-09-02 (×9): qty 1

## 2013-09-02 MED ORDER — SENNA 8.6 MG PO TABS
2.0000 | ORAL_TABLET | Freq: Two times a day (BID) | ORAL | Status: DC | PRN
  Filled 2013-09-02: qty 2

## 2013-09-02 MED ORDER — ZOLPIDEM TARTRATE 5 MG PO TABS
5.0000 mg | ORAL_TABLET | Freq: Every evening | ORAL | Status: DC | PRN
  Administered 2013-09-03 – 2013-09-09 (×5): 5 mg via ORAL
  Filled 2013-09-02 (×5): qty 1

## 2013-09-02 MED ORDER — ALUM & MAG HYDROXIDE-SIMETH 200-200-20 MG/5ML PO SUSP: 30.0000 mL | Freq: Four times a day (QID) | ORAL | Status: DC | PRN

## 2013-09-02 MED ORDER — LABETALOL HCL 5 MG/ML IV SOLN
10.0000 mg | INTRAVENOUS | Status: DC | PRN
  Administered 2013-09-02: 10 mg via INTRAVENOUS
  Filled 2013-09-02 (×2): qty 4

## 2013-09-02 MED ORDER — SODIUM CHLORIDE 0.9 % IJ SOLN
3.0000 mL | Freq: Two times a day (BID) | INTRAMUSCULAR | Status: DC
  Administered 2013-09-02 – 2013-09-09 (×14): 3 mL via INTRAVENOUS

## 2013-09-02 MED ORDER — SODIUM CHLORIDE 0.9 % IV SOLN: 250.0000 mL | INTRAVENOUS | Status: DC | PRN

## 2013-09-02 MED ORDER — PANTOPRAZOLE SODIUM 40 MG PO TBEC
40.0000 mg | DELAYED_RELEASE_TABLET | Freq: Every day | ORAL | Status: DC
  Administered 2013-09-02 – 2013-09-09 (×8): 40 mg via ORAL
  Filled 2013-09-02 (×5): qty 1

## 2013-09-02 MED ORDER — MORPHINE SULFATE 2 MG/ML IJ SOLN
2.0000 mg | Freq: Once | INTRAMUSCULAR | Status: AC
  Administered 2013-09-02: 2 mg via INTRAVENOUS
  Filled 2013-09-02: qty 1

## 2013-09-02 MED ORDER — DOCUSATE SODIUM 100 MG PO CAPS
100.0000 mg | ORAL_CAPSULE | Freq: Two times a day (BID) | ORAL | Status: DC
  Administered 2013-09-02 – 2013-09-09 (×13): 100 mg via ORAL
  Filled 2013-09-02 (×15): qty 1

## 2013-09-02 MED ORDER — LOSARTAN POTASSIUM 50 MG PO TABS
50.0000 mg | ORAL_TABLET | Freq: Every day | ORAL | Status: DC
  Administered 2013-09-02 – 2013-09-08 (×7): 50 mg via ORAL
  Filled 2013-09-02 (×8): qty 1

## 2013-09-02 MED ORDER — SODIUM CHLORIDE 0.9 % IJ SOLN: 3.0000 mL | INTRAMUSCULAR | Status: DC | PRN

## 2013-09-02 MED ORDER — FENTANYL 25 MCG/HR TD PT72
25.0000 ug | MEDICATED_PATCH | TRANSDERMAL | Status: DC
  Administered 2013-09-02 – 2013-09-08 (×3): 25 ug via TRANSDERMAL
  Filled 2013-09-02 (×3): qty 1

## 2013-09-02 MED ORDER — HEPARIN SODIUM (PORCINE) 5000 UNIT/ML IJ SOLN
5000.0000 [IU] | Freq: Three times a day (TID) | INTRAMUSCULAR | Status: DC
  Administered 2013-09-02: 5000 [IU] via SUBCUTANEOUS
  Filled 2013-09-02 (×4): qty 1

## 2013-09-02 MED ORDER — CINACALCET HCL 30 MG PO TABS
30.0000 mg | ORAL_TABLET | Freq: Every day | ORAL | Status: DC
  Administered 2013-09-03 – 2013-09-09 (×7): 30 mg via ORAL
  Filled 2013-09-02 (×8): qty 1

## 2013-09-02 MED ORDER — AMLODIPINE BESYLATE 10 MG PO TABS
10.0000 mg | ORAL_TABLET | Freq: Every day | ORAL | Status: DC
  Administered 2013-09-02 – 2013-09-09 (×8): 10 mg via ORAL
  Filled 2013-09-02 (×8): qty 1

## 2013-09-02 MED ORDER — HYDROMORPHONE HCL PF 1 MG/ML IJ SOLN
1.0000 mg | INTRAMUSCULAR | Status: DC | PRN
  Administered 2013-09-02 – 2013-09-04 (×5): 1 mg via INTRAVENOUS
  Filled 2013-09-02 (×5): qty 1

## 2013-09-02 MED ORDER — CLONIDINE HCL 0.1 MG PO TABS
0.1000 mg | ORAL_TABLET | Freq: Every day | ORAL | Status: DC
  Administered 2013-09-02: 0.1 mg via ORAL
  Filled 2013-09-02 (×2): qty 1

## 2013-09-02 MED ORDER — CARVEDILOL 12.5 MG PO TABS
12.5000 mg | ORAL_TABLET | Freq: Two times a day (BID) | ORAL | Status: DC
  Administered 2013-09-02 – 2013-09-07 (×10): 12.5 mg via ORAL
  Filled 2013-09-02 (×12): qty 1

## 2013-09-02 MED ORDER — LABETALOL HCL 5 MG/ML IV SOLN
10.0000 mg | INTRAVENOUS | Status: DC | PRN
  Filled 2013-09-02: qty 4

## 2013-09-02 MED ORDER — BISACODYL 5 MG PO TBEC: 5.0000 mg | DELAYED_RELEASE_TABLET | Freq: Every day | ORAL | Status: DC | PRN

## 2013-09-02 MED ORDER — ONDANSETRON HCL 4 MG PO TABS
4.0000 mg | ORAL_TABLET | Freq: Four times a day (QID) | ORAL | Status: DC | PRN
  Filled 2013-09-02: qty 1

## 2013-09-02 MED ORDER — ROPINIROLE HCL 1 MG PO TABS
1.0000 mg | ORAL_TABLET | Freq: Every day | ORAL | Status: DC
  Administered 2013-09-02 – 2013-09-08 (×7): 1 mg via ORAL
  Filled 2013-09-02 (×8): qty 1

## 2013-09-02 MED ORDER — DEXAMETHASONE SODIUM PHOSPHATE 4 MG/ML IJ SOLN
4.0000 mg | Freq: Three times a day (TID) | INTRAMUSCULAR | Status: DC
  Administered 2013-09-02 – 2013-09-09 (×19): 4 mg via INTRAVENOUS
  Filled 2013-09-02 (×23): qty 1

## 2013-09-02 MED ORDER — OXYCODONE HCL 5 MG PO TABS
5.0000 mg | ORAL_TABLET | ORAL | Status: DC | PRN
  Administered 2013-09-03 – 2013-09-09 (×7): 5 mg via ORAL
  Filled 2013-09-02 (×8): qty 1

## 2013-09-02 NOTE — Progress Notes (Signed)
Pt pharmacy tech checked medication.  Pt has bag of medication gave to charge nurse Clement Sayresathie Wicker, RN and will count them and send to pharmacy.

## 2013-09-02 NOTE — H&P (Addendum)
[facility] History and Physical  Julia Small YNW:[id number] DOB: 1935/09/14 DOA: 09/02/2013   PCP: Lise Auer, MD   Chief Complaint: abdominal and back pain  HPI: Julia Small is a 78 y.o. female with ESRD on HD, HTN, h/o CVA presents with abdominal pain which she has been having for about 4 months now. She has had extensive GI work up and no etiology has been determined. She was in the ER on 08/22/13 and underwent a CTA of the abdomen which was negative for intra-abdominal pathology and she was discharged home with Oxycodone. She states that she is having to take the medication often and pain remains uncontrolled. She is unable to tell if its radiating from her back to her abdomen or vice versa. It feels like a ring around her lower abdomen and back.She has also had frequent vomiting and some constipation.   General: The patient denies anorexia, fever, weight loss Cardiac: Denies chest pain, syncope, palpitations, pedal edema  Respiratory: Denies cough, shortness of breath, wheezing GI: Denies severe indigestion/heartburn, abdominal pain, nausea, vomiting, diarrhea and constipation GU: Denies hematuria, incontinence, dysuria  Musculoskeletal: Denies arthritis  Skin: Denies suspicious skin lesions Neurologic: Denies focal weakness or numbness, change in vision  Past Medical History  Diagnosis Date  . Hypertension   . GERD (gastroesophageal reflux disease)   . Arthritis   . ESRD   . CVA (cerebral infarction)     Left side weakness and facial droop  . Carpal tunnel syndrome   . Secondary hyperparathyroidism (of renal origin)     Past Surgical History  Procedure Laterality Date  . Ankle reconstruction    . Cesarean section      three  . Sp av dialysis shunt access existing *l* Left   . Abdominal hysterectomy      Social History: quit smoking 25-30 yrs ago, occasional ETOH use Lives at home alone.    Allergies  Allergen Reactions  . Simvastatin Other (See  Comments)    Reaction unknown    No family history on file.    Prior to Admission medications   Medication Sig Start Date End Date Taking? Authorizing Provider  amLODipine (NORVASC) 10 MG tablet Take 10 mg by mouth daily. 08/16/13   Historical Provider, MD  calcium acetate (PHOSLO) 667 MG capsule Take 3 capsules (2,001 mg total) by mouth 3 (three) times daily with meals. 08/10/12   Renae Fickle, MD  carvedilol (COREG) 12.5 MG tablet Take 12.5 mg by mouth 2 (two) times daily with a meal.  12/20/12   Historical Provider, MD  cloNIDine (CATAPRES) 0.1 MG tablet Take 0.1 mg by mouth daily.  12/20/12   Historical Provider, MD  dicyclomine (BENTYL) 20 MG tablet Take 20 mg by mouth every 6 (six) hours.    Historical Provider, MD  esomeprazole (NEXIUM) 40 MG capsule Take 40 mg by mouth daily.    Historical Provider, MD  fentaNYL (DURAGESIC - DOSED MCG/HR) 25 MCG/HR patch Place 25 mcg onto the skin every 3 (three) days.  02/08/13   Historical Provider, MD  HYDROcodone-acetaminophen (NORCO) 7.5-325 MG per tablet Take 1 tablet by mouth every 6 (six) hours as needed for severe pain.  12/22/12   Historical Provider, MD  losartan (COZAAR) 50 MG tablet Take 50 mg by mouth at bedtime.     Historical Provider, MD  multivitamin (RENA-VIT) TABS tablet Take 1 tablet by mouth at bedtime.    Historical Provider, MD  ondansetron (ZOFRAN-ODT) 4 MG disintegrating tablet Take 4 mg by  mouth every 8 (eight) hours as needed for vomiting.  01/24/13   Historical Provider, MD  oxyCODONE (ROXICODONE) 5 MG immediate release tablet Take 1 tablet (5 mg total) by mouth every 4 (four) hours as needed for severe pain. 08/23/13   Glynn OctaveStephen Rancour, MD  polyethylene glycol (MIRALAX / Ethelene HalGLYCOLAX) packet Take 17 g by mouth daily as needed for severe constipation.    Historical Provider, MD  rOPINIRole (REQUIP) 0.5 MG tablet Take 0.5 mg by mouth daily.    Historical Provider, MD  senna (SENOKOT) 8.6 MG TABS tablet Take 2 tablets by mouth 2 (two)  times daily.     Historical Provider, MD  SENSIPAR 30 MG tablet Take 30 mg by mouth daily. 08/11/13   Historical Provider, MD  sorbitol 70 % solution Take 15 mLs by mouth daily as needed (for constipation).    Historical Provider, MD  zolpidem (AMBIEN) 10 MG tablet Take 10 mg by mouth at bedtime as needed for sleep.    Historical Provider, MD     Physical Exam: Filed Vitals:   09/02/13 1656  BP: 169/101  Pulse: 66  Temp: 97.8 F (36.6 C)  TempSrc: Oral  Resp: 16  SpO2: 96%     General: AAO x 3, agitated from pain. HEENT: Normocephalic and Atraumatic, Mucous membranes pink                PERRLA; EOM intact; No scleral icterus,                 Nares: Patent, Oropharynx: Clear, Fair Dentition                 Neck: FROM, no cervical lymphadenopathy, thyromegaly, carotid bruit or JVD;  Breasts: deferred CHEST WALL: No tenderness  CHEST: Normal respiration, clear to auscultation bilaterally  HEART: Regular rate and rhythm; 2/6 murmurs at apex and at left upper sternal border BACK: No kyphosis or scoliosis; no CVA tenderness  ABDOMEN: Positive Bowel Sounds, soft, tender in right lower abdomen and right lower back- less tenderness in left lower abdomen; no masses, no organomegaly Rectal Exam: deferred EXTREMITIES: No cyanosis, clubbing, or edema Genitalia: not examined  SKIN:  no rash or ulceration  CNS: Alert and Oriented x 4, Nonfocal exam, CN 2-12 intact  Labs on Admission:  Basic Metabolic Panel: No results found for this basename: NA, K, CL, CO2, GLUCOSE, BUN, CREATININE, CALCIUM, MG, PHOS,  in the last 168 hours Liver Function Tests: No results found for this basename: AST, ALT, ALKPHOS, BILITOT, PROT, ALBUMIN,  in the last 168 hours No results found for this basename: LIPASE, AMYLASE,  in the last 168 hours No results found for this basename: AMMONIA,  in the last 168 hours CBC: No results found for this basename: WBC, NEUTROABS, HGB, HCT, MCV, PLT,  in the last 168  hours Cardiac Enzymes: No results found for this basename: CKTOTAL, CKMB, CKMBINDEX, TROPONINI,  in the last 168 hours  BNP (last 3 results) No results found for this basename: PROBNP,  in the last 8760 hours CBG: No results found for this basename: GLUCAP,  in the last 168 hours  Radiological Exams on Admission: No results found.  EKG: Independently reviewed. Tele monitoring reveals sinus rhythm with PACs  Assessment/Plan Principal Problem:   Abdominal pain - CTA from 8/3 reveals instability of T10-11 spine where she had her prior osteomyelitis. Her pain is quite consistent with this finding - Have contacted neurosurgery for further management Dr Venetia MaxonStern reviewed CT scan images-  recommended to start Decadron and obtain MRI to ensure there is no cord compression- Dr Venetia Maxon will see patient- he suspects she will need surgery  Active Problems:   ESRD (end stage renal disease) on dialysis - will contact nephrology  Cardiac murmurs - mitral and aortic- will obtain ECHO to help clear for probable surgery     HTN (hypertension) - resume home meds- BP quite high likely due to uncontrolled pain- focus on pain control  Consulted: Neurosurgery  Code Status: Full code  Family Communication: none  DVT Prophylaxis:Heparin  Time spent: >45 min  Trenia Tennyson, MD [facility]  If 7PM-7AM, please contact night-coverage [email or web address] 09/02/2013, 5:54 PM

## 2013-09-02 NOTE — Progress Notes (Signed)
Also phone paged [facility] and consult.

## 2013-09-02 NOTE — Progress Notes (Signed)
Paged [facility] and consults. [facility] 254/135, 238/116 hr 60, Pt agitated. Missed dialysis today.

## 2013-09-02 NOTE — H&P (Addendum)
[facility] History and Physical  Mikeila Burgen QIO:[id number] DOB: 05/09/1935 DOA: 09/02/2013   PCP: Lise Auer, MD   Chief Complaint: abdominal and back pain  HPI: Julia Small is a 78 y.o. female with ESRD on HD, HTN, h/o CVA presents with abdominal pain which she has been having for about 4 months now. She has had extensive GI work up and no etiology has been determined. She was in the ER on 08/22/13 and underwent a CTA of the abdomen which was negative for intra-abdominal pathology and she was discharged home with Oxycodone. She states that she is having to take the medication often and pain remains uncontrolled. She is unable to tell if its radiating from her back to her abdomen or vice versa. It feels like a ring around her lower abdomen and back. She has also had frequent vomiting and some constipation.   General: The patient denies anorexia, fever, weight loss Cardiac: Denies chest pain, syncope, palpitations, pedal edema  Respiratory: Denies cough, shortness of breath, wheezing GI: Denies severe indigestion/heartburn, abdominal pain, nausea, vomiting, diarrhea and constipation GU: Denies hematuria, incontinence, dysuria  Musculoskeletal: Denies arthritis  Skin: Denies suspicious skin lesions Neurologic: Denies focal weakness or numbness, change in vision  Past Medical History  Diagnosis Date  . Hypertension   . GERD (gastroesophageal reflux disease)   . Arthritis   . ESRD   . CVA (cerebral infarction)     Left side weakness and facial droop  . Carpal tunnel syndrome   . Secondary hyperparathyroidism (of renal origin)     Past Surgical History  Procedure Laterality Date  . Ankle reconstruction Left     "got 5 screws and 2 bolts in there"  . Cesarean section  X 3  . Sp av dialysis shunt access existing *l* Left   . Abdominal hysterectomy    . Cataract extraction, bilateral Bilateral     Social History: quit smoking 25-30 yrs ago, occasional ETOH use Lives  at home alone.    Allergies  Allergen Reactions  . Simvastatin Other (See Comments)    Reaction unknown    No family history on file.    Prior to Admission medications   Medication Sig Start Date End Date Taking? Authorizing Provider  amLODipine (NORVASC) 10 MG tablet Take 10 mg by mouth daily. 08/16/13   Historical Provider, MD  calcium acetate (PHOSLO) 667 MG capsule Take 3 capsules (2,001 mg total) by mouth 3 (three) times daily with meals. 08/10/12   Renae Fickle, MD  carvedilol (COREG) 12.5 MG tablet Take 12.5 mg by mouth 2 (two) times daily with a meal.  12/20/12   Historical Provider, MD  cloNIDine (CATAPRES) 0.1 MG tablet Take 0.1 mg by mouth daily.  12/20/12   Historical Provider, MD  dicyclomine (BENTYL) 20 MG tablet Take 20 mg by mouth every 6 (six) hours.    Historical Provider, MD  esomeprazole (NEXIUM) 40 MG capsule Take 40 mg by mouth daily.    Historical Provider, MD  fentaNYL (DURAGESIC - DOSED MCG/HR) 25 MCG/HR patch Place 25 mcg onto the skin every 3 (three) days.  02/08/13   Historical Provider, MD  HYDROcodone-acetaminophen (NORCO) 7.5-325 MG per tablet Take 1 tablet by mouth every 6 (six) hours as needed for severe pain.  12/22/12   Historical Provider, MD  losartan (COZAAR) 50 MG tablet Take 50 mg by mouth at bedtime.     Historical Provider, MD  multivitamin (RENA-VIT) TABS tablet Take 1 tablet by mouth at bedtime.  Historical Provider, MD  ondansetron (ZOFRAN-ODT) 4 MG disintegrating tablet Take 4 mg by mouth every 8 (eight) hours as needed for vomiting.  01/24/13   Historical Provider, MD  oxyCODONE (ROXICODONE) 5 MG immediate release tablet Take 1 tablet (5 mg total) by mouth every 4 (four) hours as needed for severe pain. 08/23/13   Glynn Octave, MD  polyethylene glycol (MIRALAX / Ethelene Hal) packet Take 17 g by mouth daily as needed for severe constipation.    Historical Provider, MD  rOPINIRole (REQUIP) 0.5 MG tablet Take 0.5 mg by mouth daily.    Historical  Provider, MD  senna (SENOKOT) 8.6 MG TABS tablet Take 2 tablets by mouth 2 (two) times daily.     Historical Provider, MD  SENSIPAR 30 MG tablet Take 30 mg by mouth daily. 08/11/13   Historical Provider, MD  sorbitol 70 % solution Take 15 mLs by mouth daily as needed (for constipation).    Historical Provider, MD  zolpidem (AMBIEN) 10 MG tablet Take 10 mg by mouth at bedtime as needed for sleep.    Historical Provider, MD     Physical Exam: Filed Vitals:   09/02/13 1656  BP: 169/101  Pulse: 66  Temp: 97.8 F (36.6 C)  TempSrc: Oral  Resp: 16  SpO2: 96%     General: AAO x 3, agitated from pain. HEENT: Normocephalic and Atraumatic, Mucous membranes pink                PERRLA; EOM intact; No scleral icterus,                 Nares: Patent, Oropharynx: Clear, Fair Dentition                 Neck: FROM, no cervical lymphadenopathy, thyromegaly, carotid bruit or JVD;  Breasts: deferred CHEST WALL: No tenderness  CHEST: Normal respiration, clear to auscultation bilaterally  HEART: Regular rate and rhythm; 2/6 murmurs at apex and at left upper sternal border BACK: No kyphosis or scoliosis; no CVA tenderness  ABDOMEN: Positive Bowel Sounds, soft, tender in right lower abdomen and right lower back- less tenderness in left lower abdomen; no masses, no organomegaly Rectal Exam: deferred EXTREMITIES: No cyanosis, clubbing, or edema Genitalia: not examined  SKIN:  no rash or ulceration  CNS: Alert and Oriented x 4, Nonfocal exam, CN 2-12 intact  Labs on Admission:  Basic Metabolic Panel: No results found for this basename: NA, K, CL, CO2, GLUCOSE, BUN, CREATININE, CALCIUM, MG, PHOS,  in the last 168 hours Liver Function Tests: No results found for this basename: AST, ALT, ALKPHOS, BILITOT, PROT, ALBUMIN,  in the last 168 hours No results found for this basename: LIPASE, AMYLASE,  in the last 168 hours No results found for this basename: AMMONIA,  in the last 168 hours CBC: No results  found for this basename: WBC, NEUTROABS, HGB, HCT, MCV, PLT,  in the last 168 hours Cardiac Enzymes: No results found for this basename: CKTOTAL, CKMB, CKMBINDEX, TROPONINI,  in the last 168 hours  BNP (last 3 results) No results found for this basename: PROBNP,  in the last 8760 hours CBG: No results found for this basename: GLUCAP,  in the last 168 hours  Radiological Exams on Admission: No results found.  EKG: Independently reviewed. Tele monitoring reveals sinus rhythm with PACs  Assessment/Plan Principal Problem:   Abdominal pain - CTA from 8/3 reveals instability of T10-11 spine where she had her prior osteomyelitis. Her pain is quite consistent with this  finding - Have contacted neurosurgery for further management-  Dr Venetia MaxonStern reviewed CT scan images- recommended to start Decadron and obtain MRI to ensure there is no cord compression- Dr Venetia MaxonStern will see patient- he suspects she will need surgery  Active Problems:   ESRD (end stage renal disease) on dialysis - have contacted nephrology- K+ is 5.4 and she does not appear fluid overloaded- pulse ox 96% on room air. - - d/w Dr Darrick Pennaeterding- Can be dialyzed in the AM.   Cardiac murmurs - mitral and aortic- will obtain ECHO to help clear for probable surgery - will obtain an EKG as well.     HTN (hypertension) - resume home meds- BP quite high likely due to uncontrolled pain- focus on pain control  Consulted: Neurosurgery  Code Status: Full code  Family Communication: none  DVT Prophylaxis:Heparin  Time spent: >45 min  Kaely Hollan, MD [facility]  If 7PM-7AM, please contact night-coverage [email or web address] 09/02/2013, 7:00 PM

## 2013-09-02 NOTE — Consult Note (Signed)
Reason for Consult:Thoracic radiculopathy Referring Physician: [name] is an 78 y.o. female.  HPI: Julia Small is a 78 y.o. female with ESRD on HD, HTN, h/o CVA presents with abdominal pain which she has been having for about 4 months now. She has had extensive GI work up and no etiology has been determined. She was in the ER on 08/22/13 and underwent a CTA of the abdomen which was negative for intra-abdominal pathology and she was discharged home with Oxycodone. She states that she is having to take the medication often and pain remains uncontrolled. She is unable to tell if its radiating from her back to her abdomen or vice versa. It feels like a ring around her lower abdomen and back. She has also had frequent vomiting and some constipation. I was asked by Dr. Butler Denmark to evaluate patient.  Based on her abdominal CT, there is evidence of kyphosis and disc degeneration at T 10/11 level, which may well be causing thoracic radiculopathy and could well be an explanation for her pain complaints, especially given negative abdominal workup.   Past Medical History  Diagnosis Date  . Renal disorder   . Hypertension   . GERD (gastroesophageal reflux disease)   . Arthritis   . Renal failure   . CVA (cerebral infarction)     Left side weakness and facial droop  . Carpal tunnel syndrome   . Secondary hyperparathyroidism (of renal origin)   . Renal insufficiency     Past Surgical History  Procedure Laterality Date  . Ankle reconstruction    . Cesarean section      three  . Sp av dialysis shunt access existing *l* Left   . Abdominal hysterectomy      No family history on file.  Social History:  reports that she has quit smoking. She has never used smokeless tobacco. She reports that she drinks alcohol. She reports that she does not use illicit drugs.  Allergies:  Allergies  Allergen Reactions  . Simvastatin Other (See Comments)    Reaction unknown    Medications: I have  reviewed the patient's current medications.  No results found for this or any previous visit (from the past 48 hour(s)).  No results found.  Review of Systems - Negative except as above    Blood pressure 169/101, pulse 66, temperature 97.8 F (36.6 C), temperature source Oral, resp. rate 16, SpO2 96.00%. Physical Exam  Constitutional: She is oriented to person, place, and time.  Elderly, ill-appearing  HENT:  Head: Normocephalic and atraumatic.  Poor dentition  Eyes: Conjunctivae and EOM are normal. Pupils are equal, round, and reactive to light.  Neck: Normal range of motion. Neck supple.  Respiratory: Effort normal.  GI: Soft.  Tenderness to palpation bilaterally and along pelvic brim is consistent with dermatomal distribution of pain   Musculoskeletal:  Thoracic kyphosis just above thoraco-lumbar junction with mild pain to palpation  Neurological: She is alert and oriented to person, place, and time. She has normal reflexes.  Skin: Skin is warm and dry.  Psychiatric: She has a normal mood and affect. Her behavior is normal. Judgment and thought content normal.    Assessment/Plan: I believe patient's pain complaints do result from her kyphosis, collapse at T 10/11 level with bilateral thoracic radiculopathies.  Patient does not have evidence of cord compression.  An MRI will be helpful to clarify structural abnormalities at this level.  Patient may get some symptomatic relief with bracing.  Surgery to correct this  problem would be quite involved (posterior thoracic fusion T8-12) with high risk to her given her multiple co-morbidities and, as I explained to the patient, i am not in favor of proceeding with surgery.  Bracing and pain control will likely be helpful to her.  I will review thoracic MRI after this is done.  Dorian HeckleSTERN,Latrel Szymczak D, MD 09/02/2013, 6:24 PM

## 2013-09-03 ENCOUNTER — Encounter (HOSPITAL_COMMUNITY): Payer: Self-pay | Admitting: Nephrology

## 2013-09-03 DIAGNOSIS — I120 Hypertensive chronic kidney disease with stage 5 chronic kidney disease or end stage renal disease: Secondary | ICD-10-CM

## 2013-09-03 DIAGNOSIS — R1084 Generalized abdominal pain: Secondary | ICD-10-CM

## 2013-09-03 DIAGNOSIS — K219 Gastro-esophageal reflux disease without esophagitis: Secondary | ICD-10-CM

## 2013-09-03 DIAGNOSIS — N186 End stage renal disease: Secondary | ICD-10-CM

## 2013-09-03 DIAGNOSIS — I330 Acute and subacute infective endocarditis: Secondary | ICD-10-CM

## 2013-09-03 DIAGNOSIS — M519 Unspecified thoracic, thoracolumbar and lumbosacral intervertebral disc disorder: Secondary | ICD-10-CM

## 2013-09-03 DIAGNOSIS — M549 Dorsalgia, unspecified: Secondary | ICD-10-CM

## 2013-09-03 DIAGNOSIS — R5381 Other malaise: Secondary | ICD-10-CM

## 2013-09-03 DIAGNOSIS — R5383 Other fatigue: Secondary | ICD-10-CM

## 2013-09-03 DIAGNOSIS — Z992 Dependence on renal dialysis: Secondary | ICD-10-CM

## 2013-09-03 DIAGNOSIS — R109 Unspecified abdominal pain: Secondary | ICD-10-CM

## 2013-09-03 DIAGNOSIS — M4804 Spinal stenosis, thoracic region: Secondary | ICD-10-CM

## 2013-09-03 LAB — CBC
HCT: 32.2 % — ABNORMAL LOW (ref 36.0–46.0)
Hemoglobin: 10.4 g/dL — ABNORMAL LOW (ref 12.0–15.0)
MCH: 28.7 pg (ref 26.0–34.0)
MCHC: 32.3 g/dL (ref 30.0–36.0)
MCV: 88.7 fL (ref 78.0–100.0)
Platelets: 118 10*3/uL — ABNORMAL LOW (ref 150–400)
RBC: 3.63 MIL/uL — ABNORMAL LOW (ref 3.87–5.11)
RDW: 16.4 % — ABNORMAL HIGH (ref 11.5–15.5)
WBC: 5.1 10*3/uL (ref 4.0–10.5)

## 2013-09-03 LAB — RENAL FUNCTION PANEL
Albumin: 3.5 g/dL (ref 3.5–5.2)
Anion gap: 14 (ref 5–15)
BUN: 26 mg/dL — ABNORMAL HIGH (ref 6–23)
CO2: 25 mEq/L (ref 19–32)
Calcium: 8.3 mg/dL — ABNORMAL LOW (ref 8.4–10.5)
Chloride: 97 mEq/L (ref 96–112)
Creatinine, Ser: 3.75 mg/dL — ABNORMAL HIGH (ref 0.50–1.10)
GFR calc Af Amer: 13 mL/min — ABNORMAL LOW (ref >90)
GFR calc non Af Amer: 11 mL/min — ABNORMAL LOW (ref >90)
Glucose, Bld: 96 mg/dL (ref 70–99)
Phosphorus: 4 mg/dL (ref 2.3–4.6)
Potassium: 4.4 mEq/L (ref 3.7–5.3)
Sodium: 136 mEq/L — ABNORMAL LOW (ref 137–147)

## 2013-09-03 MED ORDER — NEPRO/CARBSTEADY PO LIQD: 237.0000 mL | ORAL | Status: DC | PRN

## 2013-09-03 MED ORDER — CALCITRIOL 0.5 MCG PO CAPS
0.7500 ug | ORAL_CAPSULE | ORAL | Status: DC
  Administered 2013-09-05 – 2013-09-09 (×3): 0.75 ug via ORAL
  Filled 2013-09-03 (×5): qty 1

## 2013-09-03 MED ORDER — LIDOCAINE-PRILOCAINE 2.5-2.5 % EX CREA: 1.0000 "application " | TOPICAL_CREAM | CUTANEOUS | Status: DC | PRN

## 2013-09-03 MED ORDER — DICLOFENAC SODIUM 1 % TD GEL
2.0000 g | TRANSDERMAL | Status: DC | PRN
  Administered 2013-09-04 – 2013-09-08 (×5): 2 g via TOPICAL
  Filled 2013-09-03 (×3): qty 100

## 2013-09-03 MED ORDER — SODIUM CHLORIDE 0.9 % IV SOLN
100.0000 mL | INTRAVENOUS | Status: DC | PRN
Start: 2013-09-03 — End: 2013-09-03

## 2013-09-03 MED ORDER — NEPRO/CARBSTEADY PO LIQD
237.0000 mL | Freq: Two times a day (BID) | ORAL | Status: DC
  Administered 2013-09-04 – 2013-09-08 (×5): 237 mL via ORAL

## 2013-09-03 MED ORDER — CALCITRIOL 0.25 MCG PO CAPS
0.7500 ug | ORAL_CAPSULE | Freq: Once | ORAL | Status: AC
  Administered 2013-09-03: 0.75 ug via ORAL
  Filled 2013-09-03: qty 1

## 2013-09-03 MED ORDER — LIDOCAINE HCL (PF) 1 % IJ SOLN: 5.0000 mL | INTRAMUSCULAR | Status: DC | PRN

## 2013-09-03 MED ORDER — PENTAFLUOROPROP-TETRAFLUOROETH EX AERO
1.0000 | INHALATION_SPRAY | CUTANEOUS | Status: DC | PRN
Start: 2013-09-03 — End: 2013-09-03

## 2013-09-03 MED ORDER — NA FERRIC GLUC CPLX IN SUCROSE 12.5 MG/ML IV SOLN
62.5000 mg | INTRAVENOUS | Status: DC
  Administered 2013-09-07: 62.5 mg via INTRAVENOUS
  Filled 2013-09-03 (×2): qty 5

## 2013-09-03 NOTE — Procedures (Signed)
.   I was present at this dialysis session, have reviewed the session itself and made  appropriate changes  Rob Traven Davids MD (pgr) [phone]    (c) [phone] 09/03/2013, 4:05 PM '  

## 2013-09-03 NOTE — Progress Notes (Signed)
Pt was evaluated last night by Neuro MD Dr. Venetia MaxonStern and verbalized that the "patient needs to have a back brace applied." No specific order for the type of back brace nor written order was made. Paged above-named MD in the morning at [phone] and responded. Charge Nurse Ryanne received the call and relayed to primary RN that "back brace is no longer needed" per Dr. Venetia MaxonStern.

## 2013-09-03 NOTE — Consult Note (Signed)
[facility]     Reason for Consult: ? discitis    Referring Physician: Dr. Vertell Limber, Dr. Tana Coast  Principal Problem:   Abdominal pain Active Problems:   ESRD (end stage renal disease) on dialysis   Diskitis   HTN (hypertension)   GERD (gastroesophageal reflux disease)   Pain in back   Generalized weakness   Bacterial endocarditis   Spinal stenosis, thoracic with nerve root compression   . amLODipine  10 mg Oral Daily  . calcitRIOL  0.75 mcg Oral Once  . [START ON 09/05/2013] calcitRIOL  0.75 mcg Oral Q M,W,F-HD  . carvedilol  12.5 mg Oral BID WC  . cinacalcet  30 mg Oral Q breakfast  . dexamethasone  4 mg Intravenous TID  . docusate sodium  100 mg Oral BID  . feeding supplement (NEPRO CARB STEADY)  237 mL Oral BID BM  . fentaNYL  25 mcg Transdermal Q72H  . [START ON 09/07/2013] ferric gluconate (FERRLECIT/NULECIT) IV  62.5 mg Intravenous Q Wed-HD  . losartan  50 mg Oral QHS  . multivitamin  1 tablet Oral QHS  . pantoprazole  40 mg Oral Daily  . rOPINIRole  1 mg Oral QHS  . sodium chloride  3 mL Intravenous Q12H    Recommendations: ESR Disc aspiration for gram stain and culture, AFB culture, fungal culture Hold antibiotics pending aspiration   Assessment: She has been developing new abdominal pain radiating to her mid back around right side over the last several weeks.  No fever no chills.  MRI does show destructive changes c/w discitis that appears to be similar in destruction to the year before.  However with the new findings could be consider relapse and is difficult to determine.   Antibiotics: none  HPI: Julia Small is a 78 y.o. female with ESRD on dialysis, history of culture negative discitis, possible endocarditis, s/p 6 weeks of vancomycin and ceftazidime with dialysis last year who came in now with abdominal and back pain.  She tells me it has been about 3-4 weeks with pain in abdomen and radiation to back and hard for her to get  comfortable.  It hurts with movement.  Some vomiting.  No fever, no chills.  MRI with discitis that is similar in appearance to last year.     Review of Systems: A comprehensive review of systems was negative.  Past Medical History  Diagnosis Date  . Hypertension   . GERD (gastroesophageal reflux disease)   . Carpal tunnel syndrome   . Secondary hyperparathyroidism (of renal origin)   . Anginal pain   . Pneumonia   . On home oxygen therapy     "2L prn" (09/02/2013)  . H/O hiatal hernia   . Anemia   . Hepatitis C     "I'm suppose to be clear"  . Headache(784.0)     "when I'm in pain" (09/02/2013)  . CVA (cerebral vascular accident)     residual left sided weakness  . Osteoarthritis     "back, knees, hand" (09/02/2013)  . Anxiety   . Depression   . ESRD (end stage renal disease) on dialysis 08/06/2012    Started HD in 2000 in [place], [place]. She moved to [facility] in 2009 and got HD in [place], [place] for about a year then moved to [place] and has been getting HD there since. She is on MWF schedule. ESRD was caused by HTN.       History  Substance  Use Topics  . Smoking status: Former Smoker -- 1.00 packs/day for 10 years    Types: Cigarettes  . Smokeless tobacco: Never Used     Comment: "stopped smoking in the 1980's  . Alcohol Use: Yes     Comment: 09/02/2013 'only drink at celebrations"    History reviewed. No pertinent family history. Allergies  Allergen Reactions  . Simvastatin Other (See Comments)    Reaction unknown    OBJECTIVE: Blood pressure 139/88, pulse 59, temperature 97.7 F (36.5 C), temperature source Oral, resp. rate 16, height 4' 10" (1.473 m), weight 118 lb 9.7 oz (53.8 kg), SpO2 94.00%. General: awake, in dialysis Skin: no rashes Lungs: CTA Cor: RRR Abdomen: soft, nt, nd Ext: no edema  Microbiology: No results found for this or any previous visit (from the past 240 hour(s)).  Scharlene Gloss, [facility] [place] [email or web address] [phone] pager  [phone] cell 09/03/2013, 4:59 PM

## 2013-09-03 NOTE — Progress Notes (Signed)
Patient in very combative and aggressive. Anessa Notified. Refused vitals. She is swinging items around in the room. I tried to explain to patient that she is in the hospital and she got loud and started swing at me.

## 2013-09-03 NOTE — Progress Notes (Signed)
Objective: Vital signs in last 24 hours: Temp:  [97.8 F (36.6 C)-98.2 F (36.8 C)] 98.2 F (36.8 C) (08/15 0537) Pulse Rate:  [60-66] 63 (08/15 0537) Resp:  [16-17] 17 (08/15 0537) BP: (105-207)/(63-116) 105/63 mmHg (08/15 0537) SpO2:  [93 %-96 %] 93 % (08/15 0537) Weight:  [54.5 kg (120 lb 2.4 oz)] 54.5 kg (120 lb 2.4 oz) (08/14 2219)  Intake/Output from previous day: 08/14 0701 - 08/15 0700 In: 240 [P.O.:240] Out: -  Lab Results:  Recent Labs  09/02/13 2139  WBC 4.2  HGB 11.4*  HCT 36.4  PLT 71*   BMET  Recent Labs  09/02/13 2139  NA 133*  K 5.5*  CL 94*  CO2 19  GLUCOSE 85  BUN 30*  CREATININE 4.85*  CALCIUM 9.2    Studies/Results: Mr Thoracic Spine Wo Contrast  09/02/2013   CLINICAL DATA:  T10-11 discitis. Evaluate for cord compression. No contrast due to diminished renal function.  EXAM: MRI THORACIC SPINE WITHOUT CONTRAST  TECHNIQUE: Multiplanar, multisequence MR imaging of the thoracic spine was performed. No intravenous contrast was administered.  COMPARISON:  CTA chest, abdomen, and pelvis 08/22/2013  FINDINGS: Images are mildly degraded by motion. There is mild exaggeration of the normal thoracic kyphosis. There is mild left convex curvature of the thoracic spine.  As seen on multiple prior CT examinations, there are osseous destructive changes centered about the T10-11 disc space with advanced T10 and T11 vertebral body height loss. There is abnormal fluid signal within the T10-11 disc space and mild T10-11 vertebral marrow edema. Posterior displacement of bone into the spinal canal at T10-11 results in severe spinal stenosis, narrowing the AP diameter of the spinal canal to 6 mm. There is moderate flattening of the spinal cord with mild T2 hyperintensity in the spinal cord at and just below this level. Small ventral epidural fluid collections are questioned posterior to the T10 and T11 vertebral bodies. There is paravertebral soft tissue thickening/edema  centered at T10-11. There is severe bilateral neural foraminal stenosis at T10-11 due to vertebral body erosion/collapse, posterior vertebral spurring, and facet arthrosis.  Uncovertebral hypertrophy at C6-7 results in at least mild left neural foraminal stenosis, incompletely imaged. Broad-based central disc protrusion at T3-4 does not result in spinal stenosis.  IMPRESSION: Destructive changes centered about the T10-11 disc space consistent with discitis as previously described. Posterior displacement of bone into the spinal canal results in severe spinal stenosis with moderate flattening of the spinal cord and mild spinal cord edema. Small ventral epidural fluid collections/phlegmon at this level with intervertebral edema/phlegmon.   Electronically Signed   By: Sebastian AcheAllen  Grady   On: 09/02/2013 21:25    Assessment/Plan: The patient has kyphosis and canal stenosis T 10/11 with cord and nerve root compression.  This is concerning, but surgical co-morbidities still quite high.  Please obtain bone density study to determine if her bones are strong enough for her to undergo reconstructive surgery.  She would also need medical clearance if we are to proceed.    LOS: 1 day    Dorian HeckleSTERN,Matilde Pottenger D, MD 09/03/2013, 9:32 AM

## 2013-09-03 NOTE — Consult Note (Signed)
[facility] Renal Consultation Note    Indication for Consultation:  Management of ESRD/hemodialysis; anemia, hypertension/volume and secondary hyperparathyroidism PCP:  HPI: Julia Small is a 78 y.o. female with ESRD on TTS HD in [place] with a history of abdominal pain (4 mo) and neg CTA abdomen discharged on oxycodone which didn't help much. She described the pain as a ring around her lower abdomen and back.  Abdominal CT also showed some disc degeneration at T 10/11. She returned to the ED due to persistent pain. Neurosurgery  was consulted,  MRI T spine showed destructive changes c/w discitis with cord and nerve root compression.  Of significance, she  had been admitted and treated for the osteo/discitis 7/18 - 08/11/12 ) and discharged on Vanc and Fortaz through 09/17/12 - no + cultures last year o f blood/apsirate).  Consideration is being  given for surgery, but she is a high surgical rist.  Has lost significant weight over the past year; can't eat when pain is bad. Denies incontinence of stool/urine. Requires a combination of lax to treat constipation. No SOB, DOE (though not very active - uses walker), new LE edema., itching/pain of lower back. Pain along tops of  hip bones. Pain pretty well controlled today. She tells me one person told her she had shingles, but then another one told her she didn't She has bilateral CTS and chronic LE neuropathy   Chart Review: 07/2012 - back pain with coughing for 1 week, gen weakness, nausea, chills > admitted, was afebrile, WBC 3.5. CT abd showed acute discitis/osteomyelitis at T10-11. There was also suggestion of signs of prior diskitis/osteo at T9-10 and at L4-5.  Radiology did disc aspiration of 1 ml bloody fluid from the acute discitis at T10-11, and blood cultures were drawn before abx were started with vanc and cefepime. ECHO showed heavy AoV calcification, "could represent vegetation". There was no valvular insufficiency. Pt improved  on ABx and was dc'd after 2-3 days of IV abx to receive 6 weeks total per ID rec's although ID was not formally consulted that I can see.  Also ESRD on HD, hx CVA, depression.   Past Medical History  Diagnosis Date  . Hypertension   . GERD (gastroesophageal reflux disease)   . Carpal tunnel syndrome   . Secondary hyperparathyroidism (of renal origin)   . Anginal pain   . Pneumonia   . On home oxygen therapy     "2L prn" (09/02/2013)  . H/O hiatal hernia   . Anemia   . Hepatitis C     "I'm suppose to be clear"  . Headache(784.0)     "when I'm in pain" (09/02/2013)  . CVA (cerebral vascular accident)     residual left sided weakness  . Osteoarthritis     "back, knees, hand" (09/02/2013)  . Anxiety   . Depression   . ESRD on dialysis     "MWF; [facility] in [place]" (09/02/2013)  . Renal failure   . Renal insufficiency    Past Surgical History  Procedure Laterality Date  . Ankle reconstruction Left     "got 5 screws and 2 bolts in there"  . Cesarean section  X 3  . Sp av dialysis shunt access existing *l* Left   . Abdominal hysterectomy    . Cataract extraction, bilateral Bilateral   . Cardiac catheterization     History reviewed. No pertinent family history. Social History:  reports that she has quit smoking. Her smoking use included Cigarettes. She has  a 10 pack-year smoking history. She has never used smokeless tobacco. She reports that she drinks alcohol. She reports that she does not use illicit drugs. Allergies  Allergen Reactions  . Simvastatin Other (See Comments)    Reaction unknown   Prior to Admission medications   Medication Sig Start Date End Date Taking? Authorizing Provider  amLODipine (NORVASC) 10 MG tablet Take 10 mg by mouth daily. 08/16/13  Yes Historical Provider, MD  bisacodyl (DULCOLAX) 5 MG EC tablet Take 5 mg by mouth daily as needed for moderate constipation.   Yes Historical Provider, MD  calcium acetate (PHOSLO) 667 MG capsule Take 667 mg by mouth  with snacks.   Yes Historical Provider, MD  carvedilol (COREG) 12.5 MG tablet Take 12.5 mg by mouth 2 (two) times daily with a meal.  12/20/12  Yes Historical Provider, MD  cloNIDine (CATAPRES) 0.1 MG tablet Take 0.1 mg by mouth daily.  12/20/12  Yes Historical Provider, MD  docusate sodium (COLACE) 100 MG capsule Take 100 mg by mouth 2 (two) times daily.   Yes Historical Provider, MD  esomeprazole (NEXIUM) 40 MG capsule Take 40 mg by mouth daily.   Yes Historical Provider, MD  fentaNYL (DURAGESIC - DOSED MCG/HR) 25 MCG/HR patch Place 25 mcg onto the skin every 3 (three) days.  02/08/13  Yes Historical Provider, MD  HYDROcodone-acetaminophen (NORCO) 7.5-325 MG per tablet Take 1 tablet by mouth 2 (two) times daily as needed for severe pain.  12/22/12  Yes Historical Provider, MD  losartan (COZAAR) 50 MG tablet Take 50 mg by mouth at bedtime.    Yes Historical Provider, MD  multivitamin (RENA-VIT) TABS tablet Take 1 tablet by mouth at bedtime.   Yes Historical Provider, MD  oxyCODONE (ROXICODONE) 5 MG immediate release tablet Take 1 tablet (5 mg total) by mouth every 4 (four) hours as needed for severe pain. 08/23/13  Yes Glynn Octave, MD  rOPINIRole (REQUIP) 1 MG tablet Take 1 mg by mouth at bedtime.   Yes Historical Provider, MD  senna (SENOKOT) 8.6 MG TABS tablet Take 2 tablets by mouth 2 (two) times daily as needed for mild constipation.    Yes Historical Provider, MD  SENSIPAR 30 MG tablet Take 30 mg by mouth daily. 08/11/13  Yes Historical Provider, MD  zolpidem (AMBIEN) 5 MG tablet Take 5 mg by mouth at bedtime as needed for sleep.   Yes Historical Provider, MD  dicyclomine (BENTYL) 20 MG tablet Take 20 mg by mouth every 6 (six) hours.    Historical Provider, MD   Current Facility-Administered Medications  Medication Dose Route Frequency Provider Last Rate Last Dose  . 0.9 %  sodium chloride infusion  250 mL Intravenous PRN Calvert Cantor, MD      . amLODipine (NORVASC) tablet 10 mg  10 mg Oral  Daily Calvert Cantor, MD   10 mg at 09/02/13 2150  . bisacodyl (DULCOLAX) EC tablet 5 mg  5 mg Oral Daily PRN Calvert Cantor, MD      . calcitRIOL (ROCALTROL) capsule 0.75 mcg  0.75 mcg Oral Once Sheffield Slider, PA-C      . Melene Muller ON 09/05/2013] calcitRIOL (ROCALTROL) capsule 0.75 mcg  0.75 mcg Oral Q M,W,F-HD Sheffield Slider, PA-C      . calcium acetate (PHOSLO) capsule 667 mg  667 mg Oral PRN Calvert Cantor, MD      . carvedilol (COREG) tablet 12.5 mg  12.5 mg Oral BID WC Calvert Cantor, MD   12.5 mg at  09/03/13 0850  . cinacalcet (SENSIPAR) tablet 30 mg  30 mg Oral Q breakfast Calvert Cantor, MD      . cloNIDine (CATAPRES) tablet 0.1 mg  0.1 mg Oral Daily Calvert Cantor, MD   0.1 mg at 09/02/13 2150  . dexamethasone (DECADRON) injection 4 mg  4 mg Intravenous TID Calvert Cantor, MD   4 mg at 09/02/13 2153  . diclofenac sodium (VOLTAREN) 1 % transdermal gel 2 g  2 g Topical PRN Adeline C Viyuoh, MD      . docusate sodium (COLACE) capsule 100 mg  100 mg Oral BID Calvert Cantor, MD   100 mg at 09/02/13 2150  . fentaNYL (DURAGESIC - dosed mcg/hr) patch 25 mcg  25 mcg Transdermal Q72H Calvert Cantor, MD   25 mcg at 09/02/13 2141  . [START ON 09/07/2013] ferric gluconate (NULECIT) 62.5 mg in sodium chloride 0.9 % 100 mL IVPB  62.5 mg Intravenous Q Wed-HD Sheffield Slider, PA-C      . heparin injection 5,000 Units  5,000 Units Subcutaneous 3 times per day Calvert Cantor, MD   5,000 Units at 09/02/13 2152  . HYDROmorphone (DILAUDID) injection 1 mg  1 mg Intravenous Q3H PRN Calvert Cantor, MD   1 mg at 09/03/13 0454  . labetalol (NORMODYNE,TRANDATE) injection 10 mg  10 mg Intravenous Q2H PRN Calvert Cantor, MD   10 mg at 09/02/13 2254  . losartan (COZAAR) tablet 50 mg  50 mg Oral QHS Calvert Cantor, MD   50 mg at 09/02/13 2152  . multivitamin (RENA-VIT) tablet 1 tablet  1 tablet Oral QHS Calvert Cantor, MD   1 tablet at 09/02/13 2151  . ondansetron (ZOFRAN) tablet 4 mg  4 mg Oral Q6H PRN Calvert Cantor, MD       Or  .  ondansetron (ZOFRAN) injection 4 mg  4 mg Intravenous Q6H PRN Calvert Cantor, MD      . oxyCODONE (Oxy IR/ROXICODONE) immediate release tablet 5 mg  5 mg Oral Q4H PRN Calvert Cantor, MD      . pantoprazole (PROTONIX) EC tablet 40 mg  40 mg Oral Daily Calvert Cantor, MD   40 mg at 09/02/13 2151  . rOPINIRole (REQUIP) tablet 1 mg  1 mg Oral QHS Calvert Cantor, MD   1 mg at 09/02/13 2152  . senna (SENOKOT) tablet 17.2 mg  2 tablet Oral BID PRN Calvert Cantor, MD      . sodium chloride 0.9 % injection 3 mL  3 mL Intravenous Q12H Calvert Cantor, MD   3 mL at 09/02/13 2153  . sodium chloride 0.9 % injection 3 mL  3 mL Intravenous PRN Calvert Cantor, MD      . zolpidem (AMBIEN) tablet 5 mg  5 mg Oral QHS PRN Calvert Cantor, MD       Labs: Basic Metabolic Panel:  Recent Labs Lab 09/02/13 2139  NA 133*  K 5.5*  CL 94*  CO2 19  GLUCOSE 85  BUN 30*  CREATININE 4.85*  CALCIUM 9.2  CBC:  Recent Labs Lab 09/02/13 2139  WBC 4.2  HGB 11.4*  HCT 36.4  MCV 91.2  PLT 71*  Studies/Results: Mr Thoracic Spine Wo Contrast  09/02/2013   CLINICAL DATA:  T10-11 discitis. Evaluate for cord compression. No contrast due to diminished renal function.  EXAM: MRI THORACIC SPINE WITHOUT CONTRAST  TECHNIQUE: Multiplanar, multisequence MR imaging of the thoracic spine was performed. No intravenous contrast was administered.  COMPARISON:  CTA chest, abdomen, and pelvis 08/22/2013  FINDINGS: Images are mildly degraded by motion. There is mild exaggeration of the normal thoracic kyphosis. There is mild left convex curvature of the thoracic spine.  As seen on multiple prior CT examinations, there are osseous destructive changes centered about the T10-11 disc space with advanced T10 and T11 vertebral body height loss. There is abnormal fluid signal within the T10-11 disc space and mild T10-11 vertebral marrow edema. Posterior displacement of bone into the spinal canal at T10-11 results in severe spinal stenosis, narrowing the AP  diameter of the spinal canal to 6 mm. There is moderate flattening of the spinal cord with mild T2 hyperintensity in the spinal cord at and just below this level. Small ventral epidural fluid collections are questioned posterior to the T10 and T11 vertebral bodies. There is paravertebral soft tissue thickening/edema centered at T10-11. There is severe bilateral neural foraminal stenosis at T10-11 due to vertebral body erosion/collapse, posterior vertebral spurring, and facet arthrosis.  Uncovertebral hypertrophy at C6-7 results in at least mild left neural foraminal stenosis, incompletely imaged. Broad-based central disc protrusion at T3-4 does not result in spinal stenosis.  IMPRESSION: Destructive changes centered about the T10-11 disc space consistent with discitis as previously described. Posterior displacement of bone into the spinal canal results in severe spinal stenosis with moderate flattening of the spinal cord and mild spinal cord edema. Small ventral epidural fluid collections/phlegmon at this level with intervertebral edema/phlegmon.   Electronically Signed   By: Sebastian AcheAllen  Grady   On: 09/02/2013 21:25    ROS: As per HPI otherwise negative.  Physical Exam: Filed Vitals:   09/02/13 1656 09/02/13 2219 09/03/13 0537 09/03/13 0949  BP: 169/101 207/116 105/63 105/85  Pulse: 66 60 63 63  Temp: 97.8 F (36.6 C) 98 F (36.7 C) 98.2 F (36.8 C)   TempSrc: Oral     Resp: 16 17 17 18   Height:  4\' 10"  (1.473 m)    Weight:  54.5 kg (120 lb 2.4 oz)    SpO2: 96% 94% 93% 95%     General:  Elder AA lady leaning over to the side while sitting on the side of the bed, looks uncomfortable but NAD Head: Normocephalic, atraumatic, sclera non-icteric, mucus membranes are moist two lower teeth (said she has dentures at home) Neck: Supple. JVD not elevated. Lungs: Clear bilaterally to auscultation without wheezes, rales, or rhonchi. Breathing is unlabored. Heart: RRR 2/6 murmur Back: patchy scar medial lower  with scabbed areas (looks like old zoster) Abdomen:  Soft, non-tender, non-distended with normoactive bowel sounds.  Lower extremities: L > R ++  edema  Neuro: Alert and oriented X 3. Moves all extremities spontaneously. Psych:  Responds to questions appropriately with a normal affect. Dialysis Access:left upper AVF + bruit  Dialysis Orders:  Center: [facility] on TTS.  EDW 52.5 (8 kg wt loss x 1 year) HD Bath 2K/2Ca Time 3:30 Heparin NONE. Access LUA AVF BFR 400 DFR 600  Calcitriol 0.75 TIW Aranesp 25 q week- last given 8/12  Venofer 50 mg q week on Wed Labs: Hgb 11, , PTH 1500  Assessment/Plan: 1. Abdominal pain secondary to T10-11 - discitis (treated July/Aug 2014) with Vanc and Fortaz - no + cultures of blood or aspirate; NS following - ? Recurrent or further destruction; no fevers or ^ WBC; ? Previous history of zoster 2. ESRD -  K 5.5 - missed Friday HD; HD today and back on schedule Monday - she has been on no heparin HD 3. Hypertension/volume  -  up 2 kg above edw; continue current meds; BPs variable; on cozaar 50 coreg 12.5 bid and clonidine 0.1 - may be able to decrease meds and titrate volume down given LE edema - stop clonidine for now 4. Anemia  - Hgb stable - had Aranesp 25 8/12; on weekly venofer Hgb 11.4 - continue weekly IV Fe; hold on redosing Aranesp for now - would be due again 8/19;  5. Metabolic bone disease -  IPTH ^^ - her center is transitioning from hectorol to calcitriol 6. Nutrition - renal diet; 8 kg wt loss this past year - add supplement 7. Hx CVA 8. Thrombocytopenia - platelets 71 K - new - follow 9. Bilateral CTS - has difficulty holding things  Sheffield Slider, PA-C [facility] Beeper [phone] 09/03/2013, 10:32 AM   Pt seen, examined and agree w A/P as above. ESRD patient with back pain and discitis at T10-11.  Pain has been going on a few weeks with associated LE weakness proximally.  She had similar issue last year in July at T10-11 which was  treated with 6 weeks of IV abx empirically, disc aspirate and blood cx's were negative. Also last year they noted on the CT that there was evidence of prior discitis/osteo at two other levels, and pt provided hx of having something similar happen to her years ago in [place] where she was on dialysis initially.  From a renal standpoint she looks stable, no vol excess.  WU in progress, will follow.  Vinson Moselle MD pager [phone]    cell [phone] 09/03/2013, 1:07 PM

## 2013-09-03 NOTE — Progress Notes (Addendum)
Patient ID: Julia Small  female  QIH:[id number]    DOB: 15-Nov-1935    DOA: 09/02/2013  PCP: Lise Auer, MD  Assessment/Plan: Principal Problem:   Abdominal pain/back pain likely referred from Spinal stenosis, thoracic with nerve root compression - CTA from 8/3 reveals instability of T10-11 spine where she had her prior osteomyelitis - MRI of the thoracic spine 8/14 showed discitis with posterior displacement of bone into the spinal canal, severe spinal stenosis with mild cord edema - Neurosurgery was consulted, patient seen by Dr. Venetia Maxon, recommended reconstructive surgery and bone density study - Patient had history of discitis and osteomyelitis in the same area last year, completed course of Fortaz and Vanc.   - Given her constellation of symptoms and severe spinal stenosis with nerve root/ cord compression, she likely needs  surgery, moderate to high risk, will check 2-D echocardiogram as pre-op as she had previous history of endocarditis last year on the aortic valve, rule out any valvular insufficiency.  Active Problems:   ESRD (end stage renal disease) on dialysis - Renal following, hemodialysis per schedule    Diskitis - Continue vancomycin and Fortaz till 8/29, ID consult    HTN (hypertension) - Currently stable    GERD (gastroesophageal reflux disease) - Continue PPI    Generalized weakness - To start physical therapy once cleared from neurosurgery   History of Bacterial endocarditis - Will recheck 2-D echocardiogram as preop, she has completed the course of antibiotics last year  Thrombocytopenia - Chek hit panel, DC SQ heparin - Check CBC in am  DVT Prophylaxis:SCDs  Code Status:Full code  Family Communication:  Disposition:  Consultants:  Neurosurgery  Infectious disease  Procedures:  MRI  Antibiotics:  Vancomycin  Fortaz  Subjective: Patient seen and examined, still having bandlike abdominal/back pain  Objective: Weight change:    Intake/Output Summary (Last 24 hours) at 09/03/13 1051 Last data filed at 09/02/13 2150  Gross per 24 hour  Intake    240 ml  Output      0 ml  Net    240 ml   Blood pressure 105/85, pulse 63, temperature 98.2 F (36.8 C), temperature source Oral, resp. rate 18, height 4\' 10"  (1.473 m), weight 54.5 kg (120 lb 2.4 oz), SpO2 95.00%.  Physical Exam: General: Alert and awake, oriented x3, not in any acute distress. CVS: S1-S2 clear, no murmur rubs or gallops Chest: clear to auscultation bilaterally, no wheezing, rales or rhonchi Abdomen: soft tender along the pelvic brim, nondistended, normal bowel sounds  Extremities: no cyanosis, clubbing or edema noted bilaterally   Lab Results: Basic Metabolic Panel:  Recent Labs Lab 09/02/13 2139  NA 133*  K 5.5*  CL 94*  CO2 19  GLUCOSE 85  BUN 30*  CREATININE 4.85*  CALCIUM 9.2   Liver Function Tests: No results found for this basename: AST, ALT, ALKPHOS, BILITOT, PROT, ALBUMIN,  in the last 168 hours No results found for this basename: LIPASE, AMYLASE,  in the last 168 hours No results found for this basename: AMMONIA,  in the last 168 hours CBC:  Recent Labs Lab 09/02/13 2139  WBC 4.2  HGB 11.4*  HCT 36.4  MCV 91.2  PLT 71*   Cardiac Enzymes: No results found for this basename: CKTOTAL, CKMB, CKMBINDEX, TROPONINI,  in the last 168 hours BNP: No components found with this basename: POCBNP,  CBG: No results found for this basename: GLUCAP,  in the last 168 hours   Micro Results: No results found  for this or any previous visit (from the past 240 hour(s)).  Studies/Results: Mr Thoracic Spine Wo Contrast  09/02/2013   CLINICAL DATA:  T10-11 discitis. Evaluate for cord compression. No contrast due to diminished renal function.  EXAM: MRI THORACIC SPINE WITHOUT CONTRAST  TECHNIQUE: Multiplanar, multisequence MR imaging of the thoracic spine was performed. No intravenous contrast was administered.  COMPARISON:  CTA chest,  abdomen, and pelvis 08/22/2013  FINDINGS: Images are mildly degraded by motion. There is mild exaggeration of the normal thoracic kyphosis. There is mild left convex curvature of the thoracic spine.  As seen on multiple prior CT examinations, there are osseous destructive changes centered about the T10-11 disc space with advanced T10 and T11 vertebral body height loss. There is abnormal fluid signal within the T10-11 disc space and mild T10-11 vertebral marrow edema. Posterior displacement of bone into the spinal canal at T10-11 results in severe spinal stenosis, narrowing the AP diameter of the spinal canal to 6 mm. There is moderate flattening of the spinal cord with mild T2 hyperintensity in the spinal cord at and just below this level. Small ventral epidural fluid collections are questioned posterior to the T10 and T11 vertebral bodies. There is paravertebral soft tissue thickening/edema centered at T10-11. There is severe bilateral neural foraminal stenosis at T10-11 due to vertebral body erosion/collapse, posterior vertebral spurring, and facet arthrosis.  Uncovertebral hypertrophy at C6-7 results in at least mild left neural foraminal stenosis, incompletely imaged. Broad-based central disc protrusion at T3-4 does not result in spinal stenosis.  IMPRESSION: Destructive changes centered about the T10-11 disc space consistent with discitis as previously described. Posterior displacement of bone into the spinal canal results in severe spinal stenosis with moderate flattening of the spinal cord and mild spinal cord edema. Small ventral epidural fluid collections/phlegmon at this level with intervertebral edema/phlegmon.   Electronically Signed   By: Sebastian Ache   On: 09/02/2013 21:25   Dg Abd Acute W/chest  08/22/2013   CLINICAL DATA:  Abdominal pain  EXAM: ACUTE ABDOMEN SERIES (ABDOMEN 2 VIEW & CHEST 1 VIEW)  COMPARISON:  Chest radiograph August 06, 2012; abdominal radiographs August 15, 2013  FINDINGS: PA chest:  There is underlying interstitial fibrosis with scattered areas of scarring. There is no frank edema or consolidation. Heart is mildly enlarged with pulmonary vascularity within normal limits. No adenopathy. Aorta is prominent but stable.  Supine and upright abdomen: There is contrast in the colon. Bowel gas pattern is overall unremarkable. No obstruction or free air. There are multiple surgical clips and wires in the lower abdomen and pelvis.  IMPRESSION: Bowel gas pattern unremarkable. Lungs show evidence of interstitial fibrosis and scarring without frank edema or consolidation. Stable cardiac enlargement.   Electronically Signed   By: Bretta Bang M.D.   On: 08/22/2013 21:07   Ct Angio Chest Aortic Dissect W &/or W/o  08/23/2013   CLINICAL DATA:  Back and abdominal pain  EXAM: CT ANGIOGRAPHY CHEST, ABDOMEN AND PELVIS  TECHNIQUE: Multidetector CT imaging through the chest, abdomen and pelvis was performed using the standard protocol during bolus administration of intravenous contrast. Multiplanar reconstructed images and MIPs were obtained and reviewed to evaluate the vascular anatomy.  CONTRAST:  OMNIPAQUE IOHEXOL 350 MG/ML SOLN, OMNIPAQUE IOHEXOL 350 MG/ML SOLN  COMPARISON:  08/19/2013  FINDINGS: CTA CHEST FINDINGS  THORACIC INLET/BODY WALL:  Anasarca. Markedly enlarged, tortuous, and early filling veins in the left upper extremity related to dialysis fistula or graft. There is venous narrowing  at the level of the first rib crossing which could be positional. Left axillary adenopathy which could be congestive. No gross mass lesion in the left breast.  MEDIASTINUM:  Cardiomegaly. There is extensive atherosclerosis in the elongation of the aorta. No intramural hematoma, dissection, or aneurysm.  No adenopathy.  Negative esophagus.  No evidence of central pulmonary embolism. Main pulmonary arteries are enlarged compatible with pulmonary hypertension.  LUNG WINDOWS:  There is a persistent nodular  opacity in the lateral segment right middle lobe measuring 15 mm in maximal diameter. Capacity has coal less since 02/03/2013.  OSSEOUS:  No acute fracture.  No suspicious lytic or blastic lesions.  Review of the MIP images confirms the above findings.  CTA ABDOMEN AND PELVIS FINDINGS  BODY WALL: Anasarca.  Liver: No focal abnormality.  Biliary: No evidence of biliary obstruction or stone.  Pancreas: The main pancreatic duct remains chronically dilated up to 4 mm. No gross mass lesion or change from 05/07/2013.  Spleen: Unremarkable.  Adrenals: Unremarkable.  Kidneys and ureters: Atrophic and multi cystic kidneys compatible with dialysis related polycystic kidney disease.  Bladder: Decompressed.  Reproductive: Hysterectomy.  Bowel: Distal colonic diverticulosis. No bowel obstruction.  Retroperitoneum: No mass or adenopathy.  Peritoneum: No ascites or pneumoperitoneum.  Vascular: No aortic aneurysm or dissection. No major vessel stenosis or occlusion.  OSSEOUS: There is renal osteodystrophy with diffuse trabecular coarsening, subchondral erosions, and osteopenia. No change in extensive bone loss, compression deformity, and subchondral erosions at centered at the T10-11 disc. Osteophyte formation and anterolisthesis causes advanced canal stenosis at this level. Erosive changes to both facet joints at this level could be inflammatory or from abnormal motion. This could represent dialysis related spondyloarthropathy or infectious discitis/osteomyelitis. Noted that the patient has undergone empiric antibiotic treatment. Stable subchondral erosive changes at L4-5.  Review of the MIP images confirms the above findings.  IMPRESSION: 1. Negative for aortic dissection. 2. Unchanged appearance of discitis at T10-11. Bone destruction and probable instability causes advanced spinal canal stenosis at this level. 3. 15 mm nodule in the right middle lobe which his gradually increased in size/density. Neoplastic nodule is a  diagnostic possibility, consider six-month follow-up. 4. Multiple chronic/incidental findings are stable from prior and noted above.   Electronically Signed   By: Tiburcio Pea M.D.   On: 08/23/2013 00:23   Ct Cta Abd/pel W/cm &/or W/o Cm  08/23/2013   CLINICAL DATA:  Back and abdominal pain  EXAM: CT ANGIOGRAPHY CHEST, ABDOMEN AND PELVIS  TECHNIQUE: Multidetector CT imaging through the chest, abdomen and pelvis was performed using the standard protocol during bolus administration of intravenous contrast. Multiplanar reconstructed images and MIPs were obtained and reviewed to evaluate the vascular anatomy.  CONTRAST:  OMNIPAQUE IOHEXOL 350 MG/ML SOLN, OMNIPAQUE IOHEXOL 350 MG/ML SOLN  COMPARISON:  08/19/2013  FINDINGS: CTA CHEST FINDINGS  THORACIC INLET/BODY WALL:  Anasarca. Markedly enlarged, tortuous, and early filling veins in the left upper extremity related to dialysis fistula or graft. There is venous narrowing at the level of the first rib crossing which could be positional. Left axillary adenopathy which could be congestive. No gross mass lesion in the left breast.  MEDIASTINUM:  Cardiomegaly. There is extensive atherosclerosis in the elongation of the aorta. No intramural hematoma, dissection, or aneurysm.  No adenopathy.  Negative esophagus.  No evidence of central pulmonary embolism. Main pulmonary arteries are enlarged compatible with pulmonary hypertension.  LUNG WINDOWS:  There is a persistent nodular opacity in the lateral segment right  middle lobe measuring 15 mm in maximal diameter. Capacity has coal less since 02/03/2013.  OSSEOUS:  No acute fracture.  No suspicious lytic or blastic lesions.  Review of the MIP images confirms the above findings.  CTA ABDOMEN AND PELVIS FINDINGS  BODY WALL: Anasarca.  Liver: No focal abnormality.  Biliary: No evidence of biliary obstruction or stone.  Pancreas: The main pancreatic duct remains chronically dilated up to 4 mm. No gross mass lesion or  change from 05/07/2013.  Spleen: Unremarkable.  Adrenals: Unremarkable.  Kidneys and ureters: Atrophic and multi cystic kidneys compatible with dialysis related polycystic kidney disease.  Bladder: Decompressed.  Reproductive: Hysterectomy.  Bowel: Distal colonic diverticulosis. No bowel obstruction.  Retroperitoneum: No mass or adenopathy.  Peritoneum: No ascites or pneumoperitoneum.  Vascular: No aortic aneurysm or dissection. No major vessel stenosis or occlusion.  OSSEOUS: There is renal osteodystrophy with diffuse trabecular coarsening, subchondral erosions, and osteopenia. No change in extensive bone loss, compression deformity, and subchondral erosions at centered at the T10-11 disc. Osteophyte formation and anterolisthesis causes advanced canal stenosis at this level. Erosive changes to both facet joints at this level could be inflammatory or from abnormal motion. This could represent dialysis related spondyloarthropathy or infectious discitis/osteomyelitis. Noted that the patient has undergone empiric antibiotic treatment. Stable subchondral erosive changes at L4-5.  Review of the MIP images confirms the above findings.  IMPRESSION: 1. Negative for aortic dissection. 2. Unchanged appearance of discitis at T10-11. Bone destruction and probable instability causes advanced spinal canal stenosis at this level. 3. 15 mm nodule in the right middle lobe which his gradually increased in size/density. Neoplastic nodule is a diagnostic possibility, consider six-month follow-up. 4. Multiple chronic/incidental findings are stable from prior and noted above.   Electronically Signed   By: Tiburcio PeaJonathan  Watts M.D.   On: 08/23/2013 00:23    Medications: Scheduled Meds: . amLODipine  10 mg Oral Daily  . calcitRIOL  0.75 mcg Oral Once  . [START ON 09/05/2013] calcitRIOL  0.75 mcg Oral Q M,W,F-HD  . carvedilol  12.5 mg Oral BID WC  . cinacalcet  30 mg Oral Q breakfast  . cloNIDine  0.1 mg Oral Daily  . dexamethasone  4  mg Intravenous TID  . docusate sodium  100 mg Oral BID  . fentaNYL  25 mcg Transdermal Q72H  . [START ON 09/07/2013] ferric gluconate (FERRLECIT/NULECIT) IV  62.5 mg Intravenous Q Wed-HD  . heparin  5,000 Units Subcutaneous 3 times per day  . losartan  50 mg Oral QHS  . multivitamin  1 tablet Oral QHS  . pantoprazole  40 mg Oral Daily  . rOPINIRole  1 mg Oral QHS  . sodium chloride  3 mL Intravenous Q12H      LOS: 1 day   RAI,RIPUDEEP M.D. [facility] 09/03/2013, 10:51 AM Pager: [phone]  If 7PM-7AM, please contact night-coverage [email or web address] Password [email or web address]  **Disclaimer: This note was dictated with voice recognition software. Similar sounding words can inadvertently be transcribed and this note may contain transcription errors which may not have been corrected upon publication of note.**

## 2013-09-03 NOTE — Progress Notes (Addendum)
[facility], combative and started throwing items in the room. Pt complaining that she has not been receiving her "pain cream" for her hand and tried to call the police. RN was able to reorient and pacify pt. [facility] "pain [facility]" [facility]. PRN Dilaudid 1mg  IV offered and given instead. Notified MD on call regarding the "pain cream" per pt's request and ordered for BenGay cream (unavailable), Voltaren 1% 2g cream was ordered instead per telephone with read back, witnessed by charge nurse Ryanne to be applied on both hands as needed. Pt's vital signs were checked, BP=105/63. Will continue to monitor.

## 2013-09-04 DIAGNOSIS — I359 Nonrheumatic aortic valve disorder, unspecified: Secondary | ICD-10-CM

## 2013-09-04 LAB — SEDIMENTATION RATE: SED RATE: 30 mm/h — AB (ref 0–22)

## 2013-09-04 MED ORDER — CALCIUM ACETATE 667 MG PO CAPS
667.0000 mg | ORAL_CAPSULE | Freq: Three times a day (TID) | ORAL | Status: DC
  Administered 2013-09-04 – 2013-09-09 (×13): 667 mg via ORAL
  Filled 2013-09-04 (×18): qty 1

## 2013-09-04 MED ORDER — DARBEPOETIN ALFA-POLYSORBATE 25 MCG/0.42ML IJ SOLN
25.0000 ug | INTRAMUSCULAR | Status: DC
  Administered 2013-09-07: 25 ug via INTRAVENOUS
  Filled 2013-09-04: qty 0.42

## 2013-09-04 NOTE — Progress Notes (Signed)
[facility] Progress Note  Assessment/Plan: 1. Abdominal pain secondary to T10-11 - hx discitis treated July/Aug 2014 with Vanc and Fortaz - no + cultures of blood or aspirate at that time; NS following - ? Recurrent or further destruction; no fevers or ^ WBC; IV decadrone started - ID rec biopsy before starting antibioitcs 2. ESRD - MWF K 5.5 - missed Friday HD; HD today and back on schedule Monday - she has been on no heparin HD 3. Hypertension/volume -  BPs variable; on cozaar 50 coreg 12.5 bid and clonidine 0.1 - may be able to decrease meds and titrate volume down given LE edema - have stopped clonidine for now; UF 1.3 L with 8/15 HD - at edw but needs EDW lowered further-  4. Anemia - Hgb stable - had Aranesp 25 8/12; on weekly venofer Hgb 11.4 - continue weekly IV Fe; Hgb 10.4 8/16  - Aranesp due 8/19;  5. Metabolic bone disease - iPTH ^^ - her center is transitioning from hectorol to calcitriol 6. Nutrition - renal diet; 8 kg wt loss this past year - add supplement 7. Hx CVA 8. Thrombocytopenia - platelets 71 K - new - up to 118 8/15 9. Bilateral CTS - has difficulty holding things 10. Anxiety  Sheffield SliderMartha B Bergman, PA-C [facility] Beeper [phone] 09/04/2013,8:51 AM  LOS: 2 days   Pt seen, examined, agree w assess/plan as above.  Vinson Moselleob Genesi Stefanko MD pager [phone]    cell [phone] 09/04/2013, 3:49 PM     Subjective:   Multiple concerns and complaints. Has a hard time getting comfortable. Thinks the back brace may help her. "Some one talked with her about getting a biopsy" Doesn't remember having a biopsy last year. Worried about being paralyzed with any back procedure.  Hasn't eaten breakfast yet  Objective Filed Vitals:   09/03/13 1830 09/03/13 1853 09/03/13 2013 09/04/13 0520  BP: 133/80 165/88 131/74 145/91  Pulse: 65 67 74 70  Temp:  97.2 F (36.2 C) 98.4 F (36.9 C) 97.8 F (36.6 C)  TempSrc:  Oral    Resp:   18 19  Height:      Weight:   52.5 kg (115 lb 11.9 oz)    SpO2:  96% 97% 100%   Physical Exam General: sleeping, then awaked by me; now in pain, anxious Heart: RRR Lungs: grossly clear Abdomen: soft Extremities: 1+ LE edema Dialysis Access: left upper AVF patent  Dialysis Orders: Center: [facility].  EDW 52.5 (8 kg wt loss x 1 year) HD Bath 2K/2Ca Time 3:30 Heparin NONE. Access LUA AVF BFR 400 DFR 600  Calcitriol 0.75 TIW Aranesp 25 q week- last given 8/12 Venofer 50 mg q week on Wed  Labs: Hgb 11, , PTH 1500  Additional Objective Labs: Basic Metabolic Panel:  Recent Labs Lab 09/02/13 2139 09/03/13 1648  NA 133* 136*  K 5.5* 4.4  CL 94* 97  CO2 19 25  GLUCOSE 85 96  BUN 30* 26*  CREATININE 4.85* 3.75*  CALCIUM 9.2 8.3*  PHOS  --  4.0   Liver Function Tests:  Recent Labs Lab 09/03/13 1648  ALBUMIN 3.5  CBC:  Recent Labs Lab 09/02/13 2139 09/03/13 1648  WBC 4.2 5.1  HGB 11.4* 10.4*  HCT 36.4 32.2*  MCV 91.2 88.7  PLT 71* 118*  Medications:   . amLODipine  10 mg Oral Daily  . [START ON 09/05/2013] calcitRIOL  0.75 mcg Oral Q M,W,F-HD  . carvedilol  12.5  mg Oral BID WC  . cinacalcet  30 mg Oral Q breakfast  . dexamethasone  4 mg Intravenous TID  . docusate sodium  100 mg Oral BID  . feeding supplement (NEPRO CARB STEADY)  237 mL Oral BID BM  . fentaNYL  25 mcg Transdermal Q72H  . [START ON 09/07/2013] ferric gluconate (FERRLECIT/NULECIT) IV  62.5 mg Intravenous Q Wed-HD  . losartan  50 mg Oral QHS  . multivitamin  1 tablet Oral QHS  . pantoprazole  40 mg Oral Daily  . rOPINIRole  1 mg Oral QHS  . sodium chloride  3 mL Intravenous Q12H

## 2013-09-04 NOTE — Progress Notes (Addendum)
Patient ID: Larissa Pegg  female  RUE:[id number]    DOB: [birth year]    DOA: 09/02/2013  PCP: Lise Auer, MD  Addendum 2-D echo :  EF 60-65%, normal wall motion, severe focal thickening and calcification consistent with sclerosis of the aortic valve mild stenosis, no vegetations noted, no regurgitation.  Patient is a moderate-high risk for spinal surgery, however need for ruling out acute discitis and with no nerve root compression, she needs surgery, bone biopsy and cultures.   Orlan Aversa M.D. [facility] 09/04/2013, 4:40 PM  Pager: [phone]     Assessment/Plan: Principal Problem:   Abdominal pain/back pain likely referred from Spinal stenosis, thoracic with nerve root compression, discitis - MRI thoracic spine 8/14:discitis with posterior displacement of bone into the spinal canal, severe spinal stenosis with mild cord edema - Neurosurgery following Dr. Venetia Maxon, recommended reconstructive surgery and bone density study - Patient had history of discitis and osteomyelitis in the same area last year, completed course of Fortaz and Vanc.   - Given her constellation of symptoms and severe spinal stenosis with nerve root/ cord compression, concern for repeat discitis, she likely needs surgery, moderate to high risk, ordered 2-D echocardiogram as pre-op as she had previous history of endocarditis last year on the aortic valve, rule out any valvular insufficiency. - Per ID, Dr Luciana Axe, hold off on antibiotics until biopsy/cultures  Active Problems:   ESRD (end stage renal disease) on dialysis - Renal following, hemodialysis per schedule    Diskitis - Continue vancomycin and Fortaz till 8/29, ID consult    HTN (hypertension) - Currently stable    GERD (gastroesophageal reflux disease) - Continue PPI    Generalized weakness - To start physical therapy once cleared from neurosurgery   History of Bacterial endocarditis - check 2-D echocardiogram as preop, she has completed  the course of antibiotics last year  Thrombocytopenia - Continue SCDs, platelet count improving  DVT Prophylaxis:SCDs  Code Status:Full code  Family Communication:  Disposition:  Consultants:  Neurosurgery  Infectious disease  Procedures:  MRI  Antibiotics:  None  Subjective: Patient seen and examined, feels somewhat better, no fevers, chills  Objective: Weight change: -0.7 kg (-1 lb 8.7 oz)  Intake/Output Summary (Last 24 hours) at 09/04/13 0941 Last data filed at 09/03/13 2122  Gross per 24 hour  Intake    123 ml  Output   1300 ml  Net  -1177 ml   Blood pressure 145/91, pulse 70, temperature 97.8 F (36.6 C), temperature source Oral, resp. rate 19, height 4\' 10"  (1.473 m), weight 52.5 kg (115 lb 11.9 oz), SpO2 100.00%.  Physical Exam: General: Alert and awake, oriented x3, not in any acute distress. CVS: S1-S2 clear, no murmur rubs or gallops Chest: clear to auscultation bilaterally, no wheezing, rales or rhonchi Abdomen: soft tender along the pelvic brim, nondistended, normal bowel sounds  Extremities: no cyanosis, clubbing or edema noted bilaterally   Lab Results: Basic Metabolic Panel:  Recent Labs Lab 09/02/13 2139 09/03/13 1648  NA 133* 136*  K 5.5* 4.4  CL 94* 97  CO2 19 25  GLUCOSE 85 96  BUN 30* 26*  CREATININE 4.85* 3.75*  CALCIUM 9.2 8.3*  PHOS  --  4.0   Liver Function Tests:  Recent Labs Lab 09/03/13 1648  ALBUMIN 3.5   No results found for this basename: LIPASE, AMYLASE,  in the last 168 hours No results found for this basename: AMMONIA,  in the last 168 hours CBC:  Recent Labs Lab 09/02/13  2139 09/03/13 1648  WBC 4.2 5.1  HGB 11.4* 10.4*  HCT 36.4 32.2*  MCV 91.2 88.7  PLT 71* 118*   Cardiac Enzymes: No results found for this basename: CKTOTAL, CKMB, CKMBINDEX, TROPONINI,  in the last 168 hours BNP: No components found with this basename: POCBNP,  CBG: No results found for this basename: GLUCAP,  in the last  168 hours   Micro Results: No results found for this or any previous visit (from the past 240 hour(s)).  Studies/Results: Mr Thoracic Spine Wo Contrast  09/02/2013   CLINICAL DATA:  T10-11 discitis. Evaluate for cord compression. No contrast due to diminished renal function.  EXAM: MRI THORACIC SPINE WITHOUT CONTRAST  TECHNIQUE: Multiplanar, multisequence MR imaging of the thoracic spine was performed. No intravenous contrast was administered.  COMPARISON:  CTA chest, abdomen, and pelvis 08/22/2013  FINDINGS: Images are mildly degraded by motion. There is mild exaggeration of the normal thoracic kyphosis. There is mild left convex curvature of the thoracic spine.  As seen on multiple prior CT examinations, there are osseous destructive changes centered about the T10-11 disc space with advanced T10 and T11 vertebral body height loss. There is abnormal fluid signal within the T10-11 disc space and mild T10-11 vertebral marrow edema. Posterior displacement of bone into the spinal canal at T10-11 results in severe spinal stenosis, narrowing the AP diameter of the spinal canal to 6 mm. There is moderate flattening of the spinal cord with mild T2 hyperintensity in the spinal cord at and just below this level. Small ventral epidural fluid collections are questioned posterior to the T10 and T11 vertebral bodies. There is paravertebral soft tissue thickening/edema centered at T10-11. There is severe bilateral neural foraminal stenosis at T10-11 due to vertebral body erosion/collapse, posterior vertebral spurring, and facet arthrosis.  Uncovertebral hypertrophy at C6-7 results in at least mild left neural foraminal stenosis, incompletely imaged. Broad-based central disc protrusion at T3-4 does not result in spinal stenosis.  IMPRESSION: Destructive changes centered about the T10-11 disc space consistent with discitis as previously described. Posterior displacement of bone into the spinal canal results in severe spinal  stenosis with moderate flattening of the spinal cord and mild spinal cord edema. Small ventral epidural fluid collections/phlegmon at this level with intervertebral edema/phlegmon.   Electronically Signed   By: Sebastian AcheAllen  Grady   On: 09/02/2013 21:25   Dg Abd Acute W/chest  08/22/2013   CLINICAL DATA:  Abdominal pain  EXAM: ACUTE ABDOMEN SERIES (ABDOMEN 2 VIEW & CHEST 1 VIEW)  COMPARISON:  Chest radiograph August 06, 2012; abdominal radiographs August 15, 2013  FINDINGS: PA chest: There is underlying interstitial fibrosis with scattered areas of scarring. There is no frank edema or consolidation. Heart is mildly enlarged with pulmonary vascularity within normal limits. No adenopathy. Aorta is prominent but stable.  Supine and upright abdomen: There is contrast in the colon. Bowel gas pattern is overall unremarkable. No obstruction or free air. There are multiple surgical clips and wires in the lower abdomen and pelvis.  IMPRESSION: Bowel gas pattern unremarkable. Lungs show evidence of interstitial fibrosis and scarring without frank edema or consolidation. Stable cardiac enlargement.   Electronically Signed   By: Bretta BangWilliam  Woodruff M.D.   On: 08/22/2013 21:07   Ct Angio Chest Aortic Dissect W &/or W/o  08/23/2013   CLINICAL DATA:  Back and abdominal pain  EXAM: CT ANGIOGRAPHY CHEST, ABDOMEN AND PELVIS  TECHNIQUE: Multidetector CT imaging through the chest, abdomen and pelvis was performed using the standard  protocol during bolus administration of intravenous contrast. Multiplanar reconstructed images and MIPs were obtained and reviewed to evaluate the vascular anatomy.  CONTRAST:  OMNIPAQUE IOHEXOL 350 MG/ML SOLN, OMNIPAQUE IOHEXOL 350 MG/ML SOLN  COMPARISON:  08/19/2013  FINDINGS: CTA CHEST FINDINGS  THORACIC INLET/BODY WALL:  Anasarca. Markedly enlarged, tortuous, and early filling veins in the left upper extremity related to dialysis fistula or graft. There is venous narrowing at the level of the first  rib crossing which could be positional. Left axillary adenopathy which could be congestive. No gross mass lesion in the left breast.  MEDIASTINUM:  Cardiomegaly. There is extensive atherosclerosis in the elongation of the aorta. No intramural hematoma, dissection, or aneurysm.  No adenopathy.  Negative esophagus.  No evidence of central pulmonary embolism. Main pulmonary arteries are enlarged compatible with pulmonary hypertension.  LUNG WINDOWS:  There is a persistent nodular opacity in the lateral segment right middle lobe measuring 15 mm in maximal diameter. Capacity has coal less since 02/03/2013.  OSSEOUS:  No acute fracture.  No suspicious lytic or blastic lesions.  Review of the MIP images confirms the above findings.  CTA ABDOMEN AND PELVIS FINDINGS  BODY WALL: Anasarca.  Liver: No focal abnormality.  Biliary: No evidence of biliary obstruction or stone.  Pancreas: The main pancreatic duct remains chronically dilated up to 4 mm. No gross mass lesion or change from 05/07/2013.  Spleen: Unremarkable.  Adrenals: Unremarkable.  Kidneys and ureters: Atrophic and multi cystic kidneys compatible with dialysis related polycystic kidney disease.  Bladder: Decompressed.  Reproductive: Hysterectomy.  Bowel: Distal colonic diverticulosis. No bowel obstruction.  Retroperitoneum: No mass or adenopathy.  Peritoneum: No ascites or pneumoperitoneum.  Vascular: No aortic aneurysm or dissection. No major vessel stenosis or occlusion.  OSSEOUS: There is renal osteodystrophy with diffuse trabecular coarsening, subchondral erosions, and osteopenia. No change in extensive bone loss, compression deformity, and subchondral erosions at centered at the T10-11 disc. Osteophyte formation and anterolisthesis causes advanced canal stenosis at this level. Erosive changes to both facet joints at this level could be inflammatory or from abnormal motion. This could represent dialysis related spondyloarthropathy or infectious  discitis/osteomyelitis. Noted that the patient has undergone empiric antibiotic treatment. Stable subchondral erosive changes at L4-5.  Review of the MIP images confirms the above findings.  IMPRESSION: 1. Negative for aortic dissection. 2. Unchanged appearance of discitis at T10-11. Bone destruction and probable instability causes advanced spinal canal stenosis at this level. 3. 15 mm nodule in the right middle lobe which his gradually increased in size/density. Neoplastic nodule is a diagnostic possibility, consider six-month follow-up. 4. Multiple chronic/incidental findings are stable from prior and noted above.   Electronically Signed   By: Tiburcio Pea M.D.   On: 08/23/2013 00:23   Ct Cta Abd/pel W/cm &/or W/o Cm  08/23/2013   CLINICAL DATA:  Back and abdominal pain  EXAM: CT ANGIOGRAPHY CHEST, ABDOMEN AND PELVIS  TECHNIQUE: Multidetector CT imaging through the chest, abdomen and pelvis was performed using the standard protocol during bolus administration of intravenous contrast. Multiplanar reconstructed images and MIPs were obtained and reviewed to evaluate the vascular anatomy.  CONTRAST:  OMNIPAQUE IOHEXOL 350 MG/ML SOLN, OMNIPAQUE IOHEXOL 350 MG/ML SOLN  COMPARISON:  08/19/2013  FINDINGS: CTA CHEST FINDINGS  THORACIC INLET/BODY WALL:  Anasarca. Markedly enlarged, tortuous, and early filling veins in the left upper extremity related to dialysis fistula or graft. There is venous narrowing at the level of the first rib crossing which could be  positional. Left axillary adenopathy which could be congestive. No gross mass lesion in the left breast.  MEDIASTINUM:  Cardiomegaly. There is extensive atherosclerosis in the elongation of the aorta. No intramural hematoma, dissection, or aneurysm.  No adenopathy.  Negative esophagus.  No evidence of central pulmonary embolism. Main pulmonary arteries are enlarged compatible with pulmonary hypertension.  LUNG WINDOWS:  There is a persistent nodular  opacity in the lateral segment right middle lobe measuring 15 mm in maximal diameter. Capacity has coal less since 02/03/2013.  OSSEOUS:  No acute fracture.  No suspicious lytic or blastic lesions.  Review of the MIP images confirms the above findings.  CTA ABDOMEN AND PELVIS FINDINGS  BODY WALL: Anasarca.  Liver: No focal abnormality.  Biliary: No evidence of biliary obstruction or stone.  Pancreas: The main pancreatic duct remains chronically dilated up to 4 mm. No gross mass lesion or change from 05/07/2013.  Spleen: Unremarkable.  Adrenals: Unremarkable.  Kidneys and ureters: Atrophic and multi cystic kidneys compatible with dialysis related polycystic kidney disease.  Bladder: Decompressed.  Reproductive: Hysterectomy.  Bowel: Distal colonic diverticulosis. No bowel obstruction.  Retroperitoneum: No mass or adenopathy.  Peritoneum: No ascites or pneumoperitoneum.  Vascular: No aortic aneurysm or dissection. No major vessel stenosis or occlusion.  OSSEOUS: There is renal osteodystrophy with diffuse trabecular coarsening, subchondral erosions, and osteopenia. No change in extensive bone loss, compression deformity, and subchondral erosions at centered at the T10-11 disc. Osteophyte formation and anterolisthesis causes advanced canal stenosis at this level. Erosive changes to both facet joints at this level could be inflammatory or from abnormal motion. This could represent dialysis related spondyloarthropathy or infectious discitis/osteomyelitis. Noted that the patient has undergone empiric antibiotic treatment. Stable subchondral erosive changes at L4-5.  Review of the MIP images confirms the above findings.  IMPRESSION: 1. Negative for aortic dissection. 2. Unchanged appearance of discitis at T10-11. Bone destruction and probable instability causes advanced spinal canal stenosis at this level. 3. 15 mm nodule in the right middle lobe which his gradually increased in size/density. Neoplastic nodule is a  diagnostic possibility, consider six-month follow-up. 4. Multiple chronic/incidental findings are stable from prior and noted above.   Electronically Signed   By: Tiburcio Pea M.D.   On: 08/23/2013 00:23    Medications: Scheduled Meds: . amLODipine  10 mg Oral Daily  . [START ON 09/05/2013] calcitRIOL  0.75 mcg Oral Q M,W,F-HD  . carvedilol  12.5 mg Oral BID WC  . cinacalcet  30 mg Oral Q breakfast  . [START ON 09/07/2013] darbepoetin (ARANESP) injection - DIALYSIS  25 mcg Intravenous Q Wed-HD  . dexamethasone  4 mg Intravenous TID  . docusate sodium  100 mg Oral BID  . feeding supplement (NEPRO CARB STEADY)  237 mL Oral BID BM  . fentaNYL  25 mcg Transdermal Q72H  . [START ON 09/07/2013] ferric gluconate (FERRLECIT/NULECIT) IV  62.5 mg Intravenous Q Wed-HD  . losartan  50 mg Oral QHS  . multivitamin  1 tablet Oral QHS  . pantoprazole  40 mg Oral Daily  . rOPINIRole  1 mg Oral QHS  . sodium chloride  3 mL Intravenous Q12H      LOS: 2 days   Earnest Thalman M.D. [facility] 09/04/2013, 9:41 AM Pager: [phone]  If 7PM-7AM, please contact night-coverage [email or web address] Password [email or web address]  **Disclaimer: This note was dictated with voice recognition software. Similar sounding words can inadvertently be transcribed and this note may contain transcription errors which may not have been corrected upon  publication of note.**

## 2013-09-04 NOTE — Progress Notes (Signed)
  Echocardiogram 2D Echocardiogram has been performed.  Arvil ChacoFoster, Marigny Borre 09/04/2013, 12:28 PM

## 2013-09-05 DIAGNOSIS — IMO0002 Reserved for concepts with insufficient information to code with codable children: Secondary | ICD-10-CM

## 2013-09-05 DIAGNOSIS — I10 Essential (primary) hypertension: Secondary | ICD-10-CM

## 2013-09-05 LAB — CBC
HCT: 33.5 % — ABNORMAL LOW (ref 36.0–46.0)
Hemoglobin: 10.5 g/dL — ABNORMAL LOW (ref 12.0–15.0)
MCH: 28.5 pg (ref 26.0–34.0)
MCHC: 31.3 g/dL (ref 30.0–36.0)
MCV: 90.8 fL (ref 78.0–100.0)
Platelets: 137 10*3/uL — ABNORMAL LOW (ref 150–400)
RBC: 3.69 MIL/uL — ABNORMAL LOW (ref 3.87–5.11)
RDW: 16.9 % — ABNORMAL HIGH (ref 11.5–15.5)
WBC: 3.4 10*3/uL — ABNORMAL LOW (ref 4.0–10.5)

## 2013-09-05 LAB — RENAL FUNCTION PANEL
Albumin: 3.4 g/dL — ABNORMAL LOW (ref 3.5–5.2)
Anion gap: 15 (ref 5–15)
BUN: 42 mg/dL — ABNORMAL HIGH (ref 6–23)
CO2: 25 mEq/L (ref 19–32)
Calcium: 8.4 mg/dL (ref 8.4–10.5)
Chloride: 96 mEq/L (ref 96–112)
Creatinine, Ser: 5.26 mg/dL — ABNORMAL HIGH (ref 0.50–1.10)
GFR calc Af Amer: 8 mL/min — ABNORMAL LOW (ref >90)
GFR calc non Af Amer: 7 mL/min — ABNORMAL LOW (ref >90)
Glucose, Bld: 106 mg/dL — ABNORMAL HIGH (ref 70–99)
Phosphorus: 6.1 mg/dL — ABNORMAL HIGH (ref 2.3–4.6)
Potassium: 5.5 mEq/L — ABNORMAL HIGH (ref 3.7–5.3)
Sodium: 136 mEq/L — ABNORMAL LOW (ref 137–147)

## 2013-09-05 MED ORDER — SODIUM CHLORIDE 0.9 % IV SOLN: 100.0000 mL | INTRAVENOUS | Status: DC | PRN

## 2013-09-05 MED ORDER — NEPRO/CARBSTEADY PO LIQD: 237.0000 mL | ORAL | Status: DC | PRN

## 2013-09-05 MED ORDER — PENTAFLUOROPROP-TETRAFLUOROETH EX AERO: 1.0000 "application " | INHALATION_SPRAY | CUTANEOUS | Status: DC | PRN

## 2013-09-05 MED ORDER — LIDOCAINE-PRILOCAINE 2.5-2.5 % EX CREA: 1.0000 "application " | TOPICAL_CREAM | CUTANEOUS | Status: DC | PRN

## 2013-09-05 MED ORDER — LIDOCAINE HCL (PF) 1 % IJ SOLN: 5.0000 mL | INTRAMUSCULAR | Status: DC | PRN

## 2013-09-05 NOTE — Progress Notes (Signed)
CARE MANAGEMENT NOTE 09/05/2013  Patient:  Julia Small,Julia Small   Account Number:  [id number]  Medicare Important Message given?  YES (If response is "NO", the following Medicare IM given date fields will be blank) Date Medicare IM given:  09/05/2013 Medicare IM given by:  [facility],Jari Dipasquale

## 2013-09-05 NOTE — Progress Notes (Signed)
Chaplain went to complete consult request. Pt was asleep. Will return at another time.  Julia RomneyLarry J Small

## 2013-09-05 NOTE — Plan of Care (Signed)
Problem: Phase III Progression Outcomes Goal: Voiding independently Outcome: Not Applicable Date Met:  97/58/83 Patient is anuric.

## 2013-09-05 NOTE — Progress Notes (Signed)
Patient currently in dialysis. Verified with HD RN that patient needs to be NPO for disc aspiration this afternoon.  Leanna BattlesEckelmann, Canon Gola Eileen, RN.

## 2013-09-05 NOTE — Progress Notes (Signed)
Patient ID: Julia EdwardsBarbara Small  female  ZOX:[id number]    DOB: Feb 16, 1935    DOA: 09/02/2013  PCP: Lise AuerKHAN,JABER A, MD  Assessment/Plan: Principal Problem:   Abdominal pain/back pain likely referred from Spinal stenosis, thoracic with nerve root compression, discitis - MRI thoracic spine 8/14: discitis with posterior displacement of bone into the spinal canal, severe spinal stenosis with mild cord edema - Neurosurgery following Dr. Venetia MaxonStern, recommended reconstructive surgery and bone density study (STILL PENDING) - Patient had history of discitis and osteomyelitis in the same area last year, completed course of Fortaz and Vanc.   - Given her constellation of symptoms and severe spinal stenosis with nerve root/ cord compression, concern for repeat discitis, she likely needs surgery, moderate to high risk.  2-D echo : EF 60-65%, normal wall motion, severe focal thickening and calcification consistent with sclerosis of the aortic valve mild stenosis, no vegetations noted, no regurgitation. Patient is a moderate risk for spinal surgery, however need to rule out acute discitis, also has nerve root/cord compression in the same area, she needs the surgery, bone biopsy and cultures. - ID, Dr Luciana Axeomer recommended disc aspiration for cultures however I just received a call from interventional radiology that due to significant bone destruction in the area of previous discitis, Dr Corliss Skainseveshwar feels uncomfortable with disc aspiration. Updated Dr Luciana Axeomer, he will d/w Dr Venetia MaxonStern.  Active Problems:   ESRD (end stage renal disease) on dialysis - Renal following, hemodialysis per schedule    HTN (hypertension) - Currently stable    GERD (gastroesophageal reflux disease) - Continue PPI    Generalized weakness - To start physical therapy once cleared from neurosurgery   History of Bacterial endocarditis -2-D echo showed no obvious vegetations, patient cleared for neurosurgery  Thrombocytopenia - Continue SCDs, platelet  count improving  DVT Prophylaxis:SCDs  Code Status:Full code  Family Communication:  Disposition:  Consultants:  Neurosurgery  Infectious disease  Procedures:  MRI  Antibiotics:  None  Subjective: Patient seen and examined during HD, back pain controlled  Objective: Weight change: -0.729 kg (-1 lb 9.7 oz)  Intake/Output Summary (Last 24 hours) at 09/05/13 0951 Last data filed at 09/04/13 2136  Gross per 24 hour  Intake      3 ml  Output      0 ml  Net      3 ml   Blood pressure 138/79, pulse 59, temperature 98 F (36.7 C), temperature source Oral, resp. rate 16, height 4\' 10"  (1.473 m), weight 53.9 kg (118 lb 13.3 oz), SpO2 92.00%.  Physical Exam: General: Alert and awake, oriented x3, not in any acute distress. CVS: S1-S2 clear Chest: CTAB Abdomen: soft, NT, ND, normal bowel sounds  Extremities: no cyanosis, clubbing or edema noted bilaterally   Lab Results: Basic Metabolic Panel:  Recent Labs Lab 09/03/13 1648 09/05/13 0727  NA 136* 136*  K 4.4 5.5*  CL 97 96  CO2 25 25  GLUCOSE 96 106*  BUN 26* 42*  CREATININE 3.75* 5.26*  CALCIUM 8.3* 8.4  PHOS 4.0 6.1*   Liver Function Tests:  Recent Labs Lab 09/03/13 1648 09/05/13 0727  ALBUMIN 3.5 3.4*   No results found for this basename: LIPASE, AMYLASE,  in the last 168 hours No results found for this basename: AMMONIA,  in the last 168 hours CBC:  Recent Labs Lab 09/03/13 1648 09/05/13 0727  WBC 5.1 3.4*  HGB 10.4* 10.5*  HCT 32.2* 33.5*  MCV 88.7 90.8  PLT 118* 137*   Cardiac  Enzymes: No results found for this basename: CKTOTAL, CKMB, CKMBINDEX, TROPONINI,  in the last 168 hours BNP: No components found with this basename: POCBNP,  CBG: No results found for this basename: GLUCAP,  in the last 168 hours   Micro Results: Recent Results (from the past 240 hour(s))  CULTURE, BLOOD (ROUTINE X 2)     Status: None   Collection Time    09/03/13  1:10 PM      Result Value Ref Range  Status   Specimen Description BLOOD RIGHT HAND   Final   Special Requests BOTTLES DRAWN AEROBIC ONLY 1CC   Final   Culture  Setup Time     Final   Value: 09/03/2013 18:00     Performed at [facility]   Culture     Final   Value:        BLOOD CULTURE RECEIVED NO GROWTH TO DATE CULTURE WILL BE HELD FOR 5 DAYS BEFORE ISSUING A FINAL NEGATIVE REPORT     Performed at [facility]   Report Status PENDING   Incomplete  CULTURE, BLOOD (ROUTINE X 2)     Status: None   Collection Time    09/04/13  1:05 PM      Result Value Ref Range Status   Specimen Description BLOOD RIGHT HAND   Final   Special Requests BOTTLES DRAWN AEROBIC ONLY 10CC   Final   Culture  Setup Time     Final   Value: 09/04/2013 19:00     Performed at [facility]   Culture     Final   Value:        BLOOD CULTURE RECEIVED NO GROWTH TO DATE CULTURE WILL BE HELD FOR 5 DAYS BEFORE ISSUING A FINAL NEGATIVE REPORT     Performed at [facility]   Report Status PENDING   Incomplete    Studies/Results: Mr Thoracic Spine Wo Contrast  09/02/2013   CLINICAL DATA:  T10-11 discitis. Evaluate for cord compression. No contrast due to diminished renal function.  EXAM: MRI THORACIC SPINE WITHOUT CONTRAST  TECHNIQUE: Multiplanar, multisequence MR imaging of the thoracic spine was performed. No intravenous contrast was administered.  COMPARISON:  CTA chest, abdomen, and pelvis 08/22/2013  FINDINGS: Images are mildly degraded by motion. There is mild exaggeration of the normal thoracic kyphosis. There is mild left convex curvature of the thoracic spine.  As seen on multiple prior CT examinations, there are osseous destructive changes centered about the T10-11 disc space with advanced T10 and T11 vertebral body height loss. There is abnormal fluid signal within the T10-11 disc space and mild T10-11 vertebral marrow edema. Posterior displacement of bone into the spinal canal at T10-11 results in severe spinal stenosis,  narrowing the AP diameter of the spinal canal to 6 mm. There is moderate flattening of the spinal cord with mild T2 hyperintensity in the spinal cord at and just below this level. Small ventral epidural fluid collections are questioned posterior to the T10 and T11 vertebral bodies. There is paravertebral soft tissue thickening/edema centered at T10-11. There is severe bilateral neural foraminal stenosis at T10-11 due to vertebral body erosion/collapse, posterior vertebral spurring, and facet arthrosis.  Uncovertebral hypertrophy at C6-7 results in at least mild left neural foraminal stenosis, incompletely imaged. Broad-based central disc protrusion at T3-4 does not result in spinal stenosis.  IMPRESSION: Destructive changes centered about the T10-11 disc space consistent with discitis as previously described. Posterior displacement of bone into the spinal canal  results in severe spinal stenosis with moderate flattening of the spinal cord and mild spinal cord edema. Small ventral epidural fluid collections/phlegmon at this level with intervertebral edema/phlegmon.   Electronically Signed   By: Sebastian Ache   On: 09/02/2013 21:25   Dg Abd Acute W/chest  08/22/2013   CLINICAL DATA:  Abdominal pain  EXAM: ACUTE ABDOMEN SERIES (ABDOMEN 2 VIEW & CHEST 1 VIEW)  COMPARISON:  Chest radiograph August 06, 2012; abdominal radiographs August 15, 2013  FINDINGS: PA chest: There is underlying interstitial fibrosis with scattered areas of scarring. There is no frank edema or consolidation. Heart is mildly enlarged with pulmonary vascularity within normal limits. No adenopathy. Aorta is prominent but stable.  Supine and upright abdomen: There is contrast in the colon. Bowel gas pattern is overall unremarkable. No obstruction or free air. There are multiple surgical clips and wires in the lower abdomen and pelvis.  IMPRESSION: Bowel gas pattern unremarkable. Lungs show evidence of interstitial fibrosis and scarring without frank edema  or consolidation. Stable cardiac enlargement.   Electronically Signed   By: Bretta Bang M.D.   On: 08/22/2013 21:07   Ct Angio Chest Aortic Dissect W &/or W/o  08/23/2013   CLINICAL DATA:  Back and abdominal pain  EXAM: CT ANGIOGRAPHY CHEST, ABDOMEN AND PELVIS  TECHNIQUE: Multidetector CT imaging through the chest, abdomen and pelvis was performed using the standard protocol during bolus administration of intravenous contrast. Multiplanar reconstructed images and MIPs were obtained and reviewed to evaluate the vascular anatomy.  CONTRAST:  OMNIPAQUE IOHEXOL 350 MG/ML SOLN, OMNIPAQUE IOHEXOL 350 MG/ML SOLN  COMPARISON:  08/19/2013  FINDINGS: CTA CHEST FINDINGS  THORACIC INLET/BODY WALL:  Anasarca. Markedly enlarged, tortuous, and early filling veins in the left upper extremity related to dialysis fistula or graft. There is venous narrowing at the level of the first rib crossing which could be positional. Left axillary adenopathy which could be congestive. No gross mass lesion in the left breast.  MEDIASTINUM:  Cardiomegaly. There is extensive atherosclerosis in the elongation of the aorta. No intramural hematoma, dissection, or aneurysm.  No adenopathy.  Negative esophagus.  No evidence of central pulmonary embolism. Main pulmonary arteries are enlarged compatible with pulmonary hypertension.  LUNG WINDOWS:  There is a persistent nodular opacity in the lateral segment right middle lobe measuring 15 mm in maximal diameter. Capacity has coal less since 02/03/2013.  OSSEOUS:  No acute fracture.  No suspicious lytic or blastic lesions.  Review of the MIP images confirms the above findings.  CTA ABDOMEN AND PELVIS FINDINGS  BODY WALL: Anasarca.  Liver: No focal abnormality.  Biliary: No evidence of biliary obstruction or stone.  Pancreas: The main pancreatic duct remains chronically dilated up to 4 mm. No gross mass lesion or change from 05/07/2013.  Spleen: Unremarkable.  Adrenals: Unremarkable.   Kidneys and ureters: Atrophic and multi cystic kidneys compatible with dialysis related polycystic kidney disease.  Bladder: Decompressed.  Reproductive: Hysterectomy.  Bowel: Distal colonic diverticulosis. No bowel obstruction.  Retroperitoneum: No mass or adenopathy.  Peritoneum: No ascites or pneumoperitoneum.  Vascular: No aortic aneurysm or dissection. No major vessel stenosis or occlusion.  OSSEOUS: There is renal osteodystrophy with diffuse trabecular coarsening, subchondral erosions, and osteopenia. No change in extensive bone loss, compression deformity, and subchondral erosions at centered at the T10-11 disc. Osteophyte formation and anterolisthesis causes advanced canal stenosis at this level. Erosive changes to both facet joints at this level could be inflammatory or from abnormal motion. This  could represent dialysis related spondyloarthropathy or infectious discitis/osteomyelitis. Noted that the patient has undergone empiric antibiotic treatment. Stable subchondral erosive changes at L4-5.  Review of the MIP images confirms the above findings.  IMPRESSION: 1. Negative for aortic dissection. 2. Unchanged appearance of discitis at T10-11. Bone destruction and probable instability causes advanced spinal canal stenosis at this level. 3. 15 mm nodule in the right middle lobe which his gradually increased in size/density. Neoplastic nodule is a diagnostic possibility, consider six-month follow-up. 4. Multiple chronic/incidental findings are stable from prior and noted above.   Electronically Signed   By: Tiburcio Pea M.D.   On: 08/23/2013 00:23   Ct Cta Abd/pel W/cm &/or W/o Cm  08/23/2013   CLINICAL DATA:  Back and abdominal pain  EXAM: CT ANGIOGRAPHY CHEST, ABDOMEN AND PELVIS  TECHNIQUE: Multidetector CT imaging through the chest, abdomen and pelvis was performed using the standard protocol during bolus administration of intravenous contrast. Multiplanar reconstructed images and MIPs were obtained and  reviewed to evaluate the vascular anatomy.  CONTRAST:  OMNIPAQUE IOHEXOL 350 MG/ML SOLN, OMNIPAQUE IOHEXOL 350 MG/ML SOLN  COMPARISON:  08/19/2013  FINDINGS: CTA CHEST FINDINGS  THORACIC INLET/BODY WALL:  Anasarca. Markedly enlarged, tortuous, and early filling veins in the left upper extremity related to dialysis fistula or graft. There is venous narrowing at the level of the first rib crossing which could be positional. Left axillary adenopathy which could be congestive. No gross mass lesion in the left breast.  MEDIASTINUM:  Cardiomegaly. There is extensive atherosclerosis in the elongation of the aorta. No intramural hematoma, dissection, or aneurysm.  No adenopathy.  Negative esophagus.  No evidence of central pulmonary embolism. Main pulmonary arteries are enlarged compatible with pulmonary hypertension.  LUNG WINDOWS:  There is a persistent nodular opacity in the lateral segment right middle lobe measuring 15 mm in maximal diameter. Capacity has coal less since 02/03/2013.  OSSEOUS:  No acute fracture.  No suspicious lytic or blastic lesions.  Review of the MIP images confirms the above findings.  CTA ABDOMEN AND PELVIS FINDINGS  BODY WALL: Anasarca.  Liver: No focal abnormality.  Biliary: No evidence of biliary obstruction or stone.  Pancreas: The main pancreatic duct remains chronically dilated up to 4 mm. No gross mass lesion or change from 05/07/2013.  Spleen: Unremarkable.  Adrenals: Unremarkable.  Kidneys and ureters: Atrophic and multi cystic kidneys compatible with dialysis related polycystic kidney disease.  Bladder: Decompressed.  Reproductive: Hysterectomy.  Bowel: Distal colonic diverticulosis. No bowel obstruction.  Retroperitoneum: No mass or adenopathy.  Peritoneum: No ascites or pneumoperitoneum.  Vascular: No aortic aneurysm or dissection. No major vessel stenosis or occlusion.  OSSEOUS: There is renal osteodystrophy with diffuse trabecular coarsening, subchondral erosions, and  osteopenia. No change in extensive bone loss, compression deformity, and subchondral erosions at centered at the T10-11 disc. Osteophyte formation and anterolisthesis causes advanced canal stenosis at this level. Erosive changes to both facet joints at this level could be inflammatory or from abnormal motion. This could represent dialysis related spondyloarthropathy or infectious discitis/osteomyelitis. Noted that the patient has undergone empiric antibiotic treatment. Stable subchondral erosive changes at L4-5.  Review of the MIP images confirms the above findings.  IMPRESSION: 1. Negative for aortic dissection. 2. Unchanged appearance of discitis at T10-11. Bone destruction and probable instability causes advanced spinal canal stenosis at this level. 3. 15 mm nodule in the right middle lobe which his gradually increased in size/density. Neoplastic nodule is a diagnostic possibility, consider six-month follow-up.  4. Multiple chronic/incidental findings are stable from prior and noted above.   Electronically Signed   By: Tiburcio Pea M.D.   On: 08/23/2013 00:23    Medications: Scheduled Meds: . amLODipine  10 mg Oral Daily  . calcitRIOL  0.75 mcg Oral Q M,W,F-HD  . calcium acetate  667 mg Oral TID WC  . carvedilol  12.5 mg Oral BID WC  . cinacalcet  30 mg Oral Q breakfast  . [START ON 09/07/2013] darbepoetin (ARANESP) injection - DIALYSIS  25 mcg Intravenous Q Wed-HD  . dexamethasone  4 mg Intravenous TID  . docusate sodium  100 mg Oral BID  . feeding supplement (NEPRO CARB STEADY)  237 mL Oral BID BM  . fentaNYL  25 mcg Transdermal Q72H  . [START ON 09/07/2013] ferric gluconate (FERRLECIT/NULECIT) IV  62.5 mg Intravenous Q Wed-HD  . losartan  50 mg Oral QHS  . multivitamin  1 tablet Oral QHS  . pantoprazole  40 mg Oral Daily  . rOPINIRole  1 mg Oral QHS  . sodium chloride  3 mL Intravenous Q12H      LOS: 3 days   RAI,RIPUDEEP M.D. [facility] 09/05/2013, 9:51 AM Pager:  [phone]  If 7PM-7AM, please contact night-coverage [email or web address] Password [email or web address]  **Disclaimer: This note was dictated with voice recognition software. Similar sounding words can inadvertently be transcribed and this note may contain transcription errors which may not have been corrected upon publication of note.**

## 2013-09-05 NOTE — Procedures (Signed)
I was present at this dialysis session. I have reviewed the session itself and made appropriate changes.  Doing well, states pain better controlled.  AVF. RS  Sabra Heckyan Candance Bohlman  MD 09/05/2013, 9:03 AM

## 2013-09-05 NOTE — Progress Notes (Signed)
Subjective: Patient reports pain a lot better, feels it is better with brace.  Objective: Vital signs in last 24 hours: Temp:  [97.4 F (36.3 C)-98.3 F (36.8 C)] 98.1 F (36.7 C) (08/17 1649) Pulse Rate:  [56-68] 64 (08/17 1649) Resp:  [16-20] 20 (08/17 1649) BP: (124-176)/(56-99) 126/65 mmHg (08/17 1649) SpO2:  [89 %-97 %] 97 % (08/17 1649) Weight:  [51.9 kg (114 lb 6.7 oz)-53.9 kg (118 lb 13.3 oz)] 51.9 kg (114 lb 6.7 oz) (08/17 1050)  Intake/Output from previous day: 08/16 0701 - 08/17 0700 In: 3 [I.V.:3] Out: -  Intake/Output this shift:    Physical Exam: Much more comfortable today.  Good leg strength.  Lab Results:  Recent Labs  09/03/13 1648 09/05/13 0727  WBC 5.1 3.4*  HGB 10.4* 10.5*  HCT 32.2* 33.5*  PLT 118* 137*   BMET  Recent Labs  09/03/13 1648 09/05/13 0727  NA 136* 136*  K 4.4 5.5*  CL 97 96  CO2 25 25  GLUCOSE 96 106*  BUN 26* 42*  CREATININE 3.75* 5.26*  CALCIUM 8.3* 8.4    Studies/Results: No results found.  Assessment/Plan: I expressed my concern to the patient that the risks of surgery are high and I would be likely to recommend against it, particularly if brace is helping relieve her pain.  I would like to get bone density test results to know if surgery is even an option.    LOS: 3 days    Dorian HeckleSTERN,Shevonne Wolf D, MD 09/05/2013, 7:40 PM

## 2013-09-05 NOTE — Progress Notes (Signed)
[facility] for Infectious Disease  Date of Admission:  09/02/2013  Antibiotics: none  Subjective: No acute events, IR won't do biopsy  Objective: Temp:  [97.4 F (36.3 C)-98.3 F (36.8 C)] 97.4 F (36.3 C) (08/17 1123) Pulse Rate:  [56-68] 68 (08/17 1123) Resp:  [16-20] 20 (08/17 1123) BP: (124-176)/(56-99) 176/94 mmHg (08/17 1123) SpO2:  [89 %-93 %] 89 % (08/17 1123) Weight:  [114 lb 6.7 oz (51.9 kg)-118 lb 13.3 oz (53.9 kg)] 114 lb 6.7 oz (51.9 kg) (08/17 1050)  General: awake Skin: no rashes Lungs: CTA   Lab Results Lab Results  Component Value Date   WBC 3.4* 09/05/2013   HGB 10.5* 09/05/2013   HCT 33.5* 09/05/2013   MCV 90.8 09/05/2013   PLT 137* 09/05/2013    Lab Results  Component Value Date   CREATININE 5.26* 09/05/2013   BUN 42* 09/05/2013   NA 136* 09/05/2013   K 5.5* 09/05/2013   CL 96 09/05/2013   CO2 25 09/05/2013    Lab Results  Component Value Date   ALT 17 08/22/2013   AST 23 08/22/2013   ALKPHOS 146* 08/22/2013   BILITOT 0.4 08/22/2013      Microbiology: Recent Results (from the past 240 hour(s))  CULTURE, BLOOD (ROUTINE X 2)     Status: None   Collection Time    09/03/13  1:10 PM      Result Value Ref Range Status   Specimen Description BLOOD RIGHT HAND   Final   Special Requests BOTTLES DRAWN AEROBIC ONLY 1CC   Final   Culture  Setup Time     Final   Value: 09/03/2013 18:00     Performed at [facility]   Culture     Final   Value:        BLOOD CULTURE RECEIVED NO GROWTH TO DATE CULTURE WILL BE HELD FOR 5 DAYS BEFORE ISSUING A FINAL NEGATIVE REPORT     Performed at [facility]   Report Status PENDING   Incomplete  CULTURE, BLOOD (ROUTINE X 2)     Status: None   Collection Time    09/04/13  1:05 PM      Result Value Ref Range Status   Specimen Description BLOOD RIGHT HAND   Final   Special Requests BOTTLES DRAWN AEROBIC ONLY 10CC   Final   Culture  Setup Time     Final   Value: 09/04/2013 19:00     Performed at [facility]   Culture     Final   Value:        BLOOD CULTURE RECEIVED NO GROWTH TO DATE CULTURE WILL BE HELD FOR 5 DAYS BEFORE ISSUING A FINAL NEGATIVE REPORT     Performed at [facility]   Report Status PENDING   Incomplete    Studies/Results: No results found.  Assessment/Plan: 1) Disc destruction - does not appear different than previously.  ESR reassuring.  IR won't do aspiration.  Discussed with Dr. Vertell Limber and is aware IR won't do biopsy.  Waiting for bone density study and will decide on surgery.    Scharlene Gloss, [facility] Disease [place] [email or web address] [phone] pager   [phone] cell 09/05/2013, 3:02 PM

## 2013-09-06 MED ORDER — HYDROCORTISONE 1 % EX CREA
TOPICAL_CREAM | Freq: Three times a day (TID) | CUTANEOUS | Status: DC
  Administered 2013-09-06 – 2013-09-09 (×10): via TOPICAL
  Filled 2013-09-06: qty 28

## 2013-09-06 MED ORDER — DIPHENHYDRAMINE-ZINC ACETATE 2-0.1 % EX CREA
TOPICAL_CREAM | Freq: Three times a day (TID) | CUTANEOUS | Status: DC | PRN
  Filled 2013-09-06: qty 28

## 2013-09-06 NOTE — Progress Notes (Signed)
Chaplain responded to request for AD. Pt stated that she has an AD but would like to update it. However, she stated she needs to discuss this subject with her family before making the changes. She expects to do this when she is discharged. Chaplain explained that if she changes her mind, chaplains available to help her.

## 2013-09-06 NOTE — Progress Notes (Deleted)
Entered room to place pt on CPAP and patient refused to wear.

## 2013-09-06 NOTE — Progress Notes (Signed)
Subjective: Patient reports "I feel some better with this brace on, but I still hurt when I go to sit up>"  Objective: Vital signs in last 24 hours: Temp:  [97.4 F (36.3 C)-98.6 F (37 C)] 98 F (36.7 C) (08/18 0958) Pulse Rate:  [49-64] 61 (08/18 0958) Resp:  [16-20] 18 (08/18 0958) BP: (126-159)/(65-105) 156/75 mmHg (08/18 0958) SpO2:  [91 %-100 %] 100 % (08/18 0958)  Intake/Output from previous day: 08/17 0701 - 08/18 0700 In: -  Out: 1904  Intake/Output this shift: Total I/O In: 240 [P.O.:240] Out: 0   Alert, conversant, sitting at EOB in TLSO. Pt reports decreased back & abdomen pain in TLSO. Pt voices frustration with the extra time on hemodialysis when IVAB course implemented.  She does verbalize understanding for the need for IVAB.  Lab Results:  Recent Labs  09/03/13 1648 09/05/13 0727  WBC 5.1 3.4*  HGB 10.4* 10.5*  HCT 32.2* 33.5*  PLT 118* 137*   BMET  Recent Labs  09/03/13 1648 09/05/13 0727  NA 136* 136*  K 4.4 5.5*  CL 97 96  CO2 25 25  GLUCOSE 96 106*  BUN 26* 42*  CREATININE 3.75* 5.26*  CALCIUM 8.3* 8.4    Studies/Results: No results found.  Assessment/Plan:   LOS: 4 days  Awaits bone density testing to determine if spinal surgery is an option. Pt considering avoidance of surgery regardless of test results at present.    Georgiann Cockeroteat, Jamaine Quintin 09/06/2013, 1:26 PM

## 2013-09-06 NOTE — Progress Notes (Signed)
Patient ID: Julia Small  female  UEA:[id number]    DOB: [birth year]    DOA: 09/02/2013  PCP: Lise Auer, MD  Interim history Harlei Lehrmann is a 78 y.o. female with ESRD on HD, HTN, h/o CVA presents with abdominal pain which she has been having for about 4 months now. She has had extensive GI work up and no etiology has been determined. She was in the ER on 08/22/13 and underwent a CTA of the abdomen which was negative for intra-abdominal pathology and she was discharged home with Oxycodone. She states that she is having to take the medication often and pain remains uncontrolled. She is unable to tell if its radiating from her back to her abdomen or vice versa. It feels like a ring around her lower abdomen and back. MRI of the thoracic spine showed discitis with severe spinal stenosis, mild cord edema. Patient has a history of discitis in the same region and was treated with [place] and vancomycin last year. Neurosurgery, Dr Venetia Maxon consulted and recommended reconstructive spine surgery. Bone density study pending which is needed prior to the reconstructive surgery. Patient also needs bone biopsy, cultures, AFB, fungal cultures. ID (Dr Luciana Axe) recommended holding antibiotics for now. IR was not able to do disc aspiration due to significant bone destruction in the area. Given her prior history of endocarditis last year, 2-D echo was done which showed no valvular vegetations or regurgitation, hence cleared for surgery  Assessment/Plan: Principal Problem:   Abdominal pain/back pain likely referred from Spinal stenosis, thoracic with nerve root compression, discitis - MRI thoracic spine 8/14: discitis with posterior displacement of bone into the spinal canal, severe spinal stenosis with mild cord edema - Neurosurgery following Dr. Venetia Maxon, recommended reconstructive surgery and bone density study (STILL PENDING),I reordered it stat. - Patient had history of discitis and osteomyelitis in the same area last  year, completed course of Fortaz and Vanc.   - Given her constellation of symptoms and severe spinal stenosis with nerve root/ cord compression, concern for repeat discitis, she likely needs surgery, moderate to high risk.   Prior history of endocarditis last 2014 - 2-D echo : EF 60-65%, normal wall motion, severe focal thickening and calcification consistent with sclerosis of the aortic valve mild stenosis, no vegetations noted, no regurgitation. Patient is a moderate risk for spinal surgery, however need to rule out acute discitis, also has nerve root/cord compression in the same area, she needs the surgery, bone biopsy and cultures.    Active Problems:   ESRD (end stage renal disease) on dialysis - Renal following, hemodialysis per schedule    HTN (hypertension) - Currently stable    GERD (gastroesophageal reflux disease) - Continue PPI    Generalized weakness - To start physical therapy once cleared from neurosurgery   History of Bacterial endocarditis -2-D echo showed no obvious vegetations, patient cleared for neurosurgery  Thrombocytopenia - Continue SCDs, platelet count improving  DVT Prophylaxis:SCDs  Code Status:Full code  Family Communication:  Disposition:  Consultants:  Neurosurgery, dr Venetia Maxon  Infectious disease, Dr Luciana Axe  RENAL, Dr Marisue Humble  Procedures:  MRI  Antibiotics:  None  Subjective: Patient seen and examined, back pain is controlled  Objective: Weight change: 0.829 kg (1 lb 13.3 oz) No intake or output data in the 24 hours ending 09/06/13 1058 Blood pressure 159/76, pulse 62, temperature 98.6 F (37 C), temperature source Oral, resp. rate 16, height 4\' 10"  (1.473 m), weight 51.9 kg (114 lb 6.7 oz), SpO2 97.00%.  Physical  Exam: General: Alert and awake, oriented x3, NAD CVS: S1-S2 clear Chest: CTAB Abdomen: soft, NT, ND, normal bowel sounds  Extremities: no c/c/e   Lab Results: Basic Metabolic Panel:  Recent Labs Lab  09/03/13 1648 09/05/13 0727  NA 136* 136*  K 4.4 5.5*  CL 97 96  CO2 25 25  GLUCOSE 96 106*  BUN 26* 42*  CREATININE 3.75* 5.26*  CALCIUM 8.3* 8.4  PHOS 4.0 6.1*   Liver Function Tests:  Recent Labs Lab 09/03/13 1648 09/05/13 0727  ALBUMIN 3.5 3.4*   No results found for this basename: LIPASE, AMYLASE,  in the last 168 hours No results found for this basename: AMMONIA,  in the last 168 hours CBC:  Recent Labs Lab 09/03/13 1648 09/05/13 0727  WBC 5.1 3.4*  HGB 10.4* 10.5*  HCT 32.2* 33.5*  MCV 88.7 90.8  PLT 118* 137*   Cardiac Enzymes: No results found for this basename: CKTOTAL, CKMB, CKMBINDEX, TROPONINI,  in the last 168 hours BNP: No components found with this basename: POCBNP,  CBG: No results found for this basename: GLUCAP,  in the last 168 hours   Micro Results: Recent Results (from the past 240 hour(s))  CULTURE, BLOOD (ROUTINE X 2)     Status: None   Collection Time    09/03/13  1:10 PM      Result Value Ref Range Status   Specimen Description BLOOD RIGHT HAND   Final   Special Requests BOTTLES DRAWN AEROBIC ONLY 1CC   Final   Culture  Setup Time     Final   Value: 09/03/2013 18:00     Performed at [facility]   Culture     Final   Value:        BLOOD CULTURE RECEIVED NO GROWTH TO DATE CULTURE WILL BE HELD FOR 5 DAYS BEFORE ISSUING A FINAL NEGATIVE REPORT     Performed at [facility]   Report Status PENDING   Incomplete  CULTURE, BLOOD (ROUTINE X 2)     Status: None   Collection Time    09/04/13  1:05 PM      Result Value Ref Range Status   Specimen Description BLOOD RIGHT HAND   Final   Special Requests BOTTLES DRAWN AEROBIC ONLY 10CC   Final   Culture  Setup Time     Final   Value: 09/04/2013 19:00     Performed at [facility]   Culture     Final   Value:        BLOOD CULTURE RECEIVED NO GROWTH TO DATE CULTURE WILL BE HELD FOR 5 DAYS BEFORE ISSUING A FINAL NEGATIVE REPORT     Performed at [facility]    Report Status PENDING   Incomplete    Studies/Results: Mr Thoracic Spine Wo Contrast  09/02/2013   CLINICAL DATA:  T10-11 discitis. Evaluate for cord compression. No contrast due to diminished renal function.  EXAM: MRI THORACIC SPINE WITHOUT CONTRAST  TECHNIQUE: Multiplanar, multisequence MR imaging of the thoracic spine was performed. No intravenous contrast was administered.  COMPARISON:  CTA chest, abdomen, and pelvis 08/22/2013  FINDINGS: Images are mildly degraded by motion. There is mild exaggeration of the normal thoracic kyphosis. There is mild left convex curvature of the thoracic spine.  As seen on multiple prior CT examinations, there are osseous destructive changes centered about the T10-11 disc space with advanced T10 and T11 vertebral body height loss. There is abnormal fluid signal within the  T10-11 disc space and mild T10-11 vertebral marrow edema. Posterior displacement of bone into the spinal canal at T10-11 results in severe spinal stenosis, narrowing the AP diameter of the spinal canal to 6 mm. There is moderate flattening of the spinal cord with mild T2 hyperintensity in the spinal cord at and just below this level. Small ventral epidural fluid collections are questioned posterior to the T10 and T11 vertebral bodies. There is paravertebral soft tissue thickening/edema centered at T10-11. There is severe bilateral neural foraminal stenosis at T10-11 due to vertebral body erosion/collapse, posterior vertebral spurring, and facet arthrosis.  Uncovertebral hypertrophy at C6-7 results in at least mild left neural foraminal stenosis, incompletely imaged. Broad-based central disc protrusion at T3-4 does not result in spinal stenosis.  IMPRESSION: Destructive changes centered about the T10-11 disc space consistent with discitis as previously described. Posterior displacement of bone into the spinal canal results in severe spinal stenosis with moderate flattening of the spinal cord and mild  spinal cord edema. Small ventral epidural fluid collections/phlegmon at this level with intervertebral edema/phlegmon.   Electronically Signed   By: Sebastian Ache   On: 09/02/2013 21:25   Dg Abd Acute W/chest  08/22/2013   CLINICAL DATA:  Abdominal pain  EXAM: ACUTE ABDOMEN SERIES (ABDOMEN 2 VIEW & CHEST 1 VIEW)  COMPARISON:  Chest radiograph August 06, 2012; abdominal radiographs August 15, 2013  FINDINGS: PA chest: There is underlying interstitial fibrosis with scattered areas of scarring. There is no frank edema or consolidation. Heart is mildly enlarged with pulmonary vascularity within normal limits. No adenopathy. Aorta is prominent but stable.  Supine and upright abdomen: There is contrast in the colon. Bowel gas pattern is overall unremarkable. No obstruction or free air. There are multiple surgical clips and wires in the lower abdomen and pelvis.  IMPRESSION: Bowel gas pattern unremarkable. Lungs show evidence of interstitial fibrosis and scarring without frank edema or consolidation. Stable cardiac enlargement.   Electronically Signed   By: Bretta Bang M.D.   On: 08/22/2013 21:07   Ct Angio Chest Aortic Dissect W &/or W/o  08/23/2013   CLINICAL DATA:  Back and abdominal pain  EXAM: CT ANGIOGRAPHY CHEST, ABDOMEN AND PELVIS  TECHNIQUE: Multidetector CT imaging through the chest, abdomen and pelvis was performed using the standard protocol during bolus administration of intravenous contrast. Multiplanar reconstructed images and MIPs were obtained and reviewed to evaluate the vascular anatomy.  CONTRAST:  OMNIPAQUE IOHEXOL 350 MG/ML SOLN, OMNIPAQUE IOHEXOL 350 MG/ML SOLN  COMPARISON:  08/19/2013  FINDINGS: CTA CHEST FINDINGS  THORACIC INLET/BODY WALL:  Anasarca. Markedly enlarged, tortuous, and early filling veins in the left upper extremity related to dialysis fistula or graft. There is venous narrowing at the level of the first rib crossing which could be positional. Left axillary adenopathy  which could be congestive. No gross mass lesion in the left breast.  MEDIASTINUM:  Cardiomegaly. There is extensive atherosclerosis in the elongation of the aorta. No intramural hematoma, dissection, or aneurysm.  No adenopathy.  Negative esophagus.  No evidence of central pulmonary embolism. Main pulmonary arteries are enlarged compatible with pulmonary hypertension.  LUNG WINDOWS:  There is a persistent nodular opacity in the lateral segment right middle lobe measuring 15 mm in maximal diameter. Capacity has coal less since 02/03/2013.  OSSEOUS:  No acute fracture.  No suspicious lytic or blastic lesions.  Review of the MIP images confirms the above findings.  CTA ABDOMEN AND PELVIS FINDINGS  BODY WALL: Anasarca.  Liver:  No focal abnormality.  Biliary: No evidence of biliary obstruction or stone.  Pancreas: The main pancreatic duct remains chronically dilated up to 4 mm. No gross mass lesion or change from 05/07/2013.  Spleen: Unremarkable.  Adrenals: Unremarkable.  Kidneys and ureters: Atrophic and multi cystic kidneys compatible with dialysis related polycystic kidney disease.  Bladder: Decompressed.  Reproductive: Hysterectomy.  Bowel: Distal colonic diverticulosis. No bowel obstruction.  Retroperitoneum: No mass or adenopathy.  Peritoneum: No ascites or pneumoperitoneum.  Vascular: No aortic aneurysm or dissection. No major vessel stenosis or occlusion.  OSSEOUS: There is renal osteodystrophy with diffuse trabecular coarsening, subchondral erosions, and osteopenia. No change in extensive bone loss, compression deformity, and subchondral erosions at centered at the T10-11 disc. Osteophyte formation and anterolisthesis causes advanced canal stenosis at this level. Erosive changes to both facet joints at this level could be inflammatory or from abnormal motion. This could represent dialysis related spondyloarthropathy or infectious discitis/osteomyelitis. Noted that the patient has undergone empiric antibiotic  treatment. Stable subchondral erosive changes at L4-5.  Review of the MIP images confirms the above findings.  IMPRESSION: 1. Negative for aortic dissection. 2. Unchanged appearance of discitis at T10-11. Bone destruction and probable instability causes advanced spinal canal stenosis at this level. 3. 15 mm nodule in the right middle lobe which his gradually increased in size/density. Neoplastic nodule is a diagnostic possibility, consider six-month follow-up. 4. Multiple chronic/incidental findings are stable from prior and noted above.   Electronically Signed   By: Tiburcio Pea M.D.   On: 08/23/2013 00:23   Ct Cta Abd/pel W/cm &/or W/o Cm  08/23/2013   CLINICAL DATA:  Back and abdominal pain  EXAM: CT ANGIOGRAPHY CHEST, ABDOMEN AND PELVIS  TECHNIQUE: Multidetector CT imaging through the chest, abdomen and pelvis was performed using the standard protocol during bolus administration of intravenous contrast. Multiplanar reconstructed images and MIPs were obtained and reviewed to evaluate the vascular anatomy.  CONTRAST:  OMNIPAQUE IOHEXOL 350 MG/ML SOLN, OMNIPAQUE IOHEXOL 350 MG/ML SOLN  COMPARISON:  08/19/2013  FINDINGS: CTA CHEST FINDINGS  THORACIC INLET/BODY WALL:  Anasarca. Markedly enlarged, tortuous, and early filling veins in the left upper extremity related to dialysis fistula or graft. There is venous narrowing at the level of the first rib crossing which could be positional. Left axillary adenopathy which could be congestive. No gross mass lesion in the left breast.  MEDIASTINUM:  Cardiomegaly. There is extensive atherosclerosis in the elongation of the aorta. No intramural hematoma, dissection, or aneurysm.  No adenopathy.  Negative esophagus.  No evidence of central pulmonary embolism. Main pulmonary arteries are enlarged compatible with pulmonary hypertension.  LUNG WINDOWS:  There is a persistent nodular opacity in the lateral segment right middle lobe measuring 15 mm in maximal  diameter. Capacity has coal less since 02/03/2013.  OSSEOUS:  No acute fracture.  No suspicious lytic or blastic lesions.  Review of the MIP images confirms the above findings.  CTA ABDOMEN AND PELVIS FINDINGS  BODY WALL: Anasarca.  Liver: No focal abnormality.  Biliary: No evidence of biliary obstruction or stone.  Pancreas: The main pancreatic duct remains chronically dilated up to 4 mm. No gross mass lesion or change from 05/07/2013.  Spleen: Unremarkable.  Adrenals: Unremarkable.  Kidneys and ureters: Atrophic and multi cystic kidneys compatible with dialysis related polycystic kidney disease.  Bladder: Decompressed.  Reproductive: Hysterectomy.  Bowel: Distal colonic diverticulosis. No bowel obstruction.  Retroperitoneum: No mass or adenopathy.  Peritoneum: No ascites or pneumoperitoneum.  Vascular: No  aortic aneurysm or dissection. No major vessel stenosis or occlusion.  OSSEOUS: There is renal osteodystrophy with diffuse trabecular coarsening, subchondral erosions, and osteopenia. No change in extensive bone loss, compression deformity, and subchondral erosions at centered at the T10-11 disc. Osteophyte formation and anterolisthesis causes advanced canal stenosis at this level. Erosive changes to both facet joints at this level could be inflammatory or from abnormal motion. This could represent dialysis related spondyloarthropathy or infectious discitis/osteomyelitis. Noted that the patient has undergone empiric antibiotic treatment. Stable subchondral erosive changes at L4-5.  Review of the MIP images confirms the above findings.  IMPRESSION: 1. Negative for aortic dissection. 2. Unchanged appearance of discitis at T10-11. Bone destruction and probable instability causes advanced spinal canal stenosis at this level. 3. 15 mm nodule in the right middle lobe which his gradually increased in size/density. Neoplastic nodule is a diagnostic possibility, consider six-month follow-up. 4. Multiple chronic/incidental  findings are stable from prior and noted above.   Electronically Signed   By: Tiburcio PeaJonathan  Watts M.D.   On: 08/23/2013 00:23    Medications: Scheduled Meds: . amLODipine  10 mg Oral Daily  . calcitRIOL  0.75 mcg Oral Q M,W,F-HD  . calcium acetate  667 mg Oral TID WC  . carvedilol  12.5 mg Oral BID WC  . cinacalcet  30 mg Oral Q breakfast  . [START ON 09/07/2013] darbepoetin (ARANESP) injection - DIALYSIS  25 mcg Intravenous Q Wed-HD  . dexamethasone  4 mg Intravenous TID  . docusate sodium  100 mg Oral BID  . feeding supplement (NEPRO CARB STEADY)  237 mL Oral BID BM  . fentaNYL  25 mcg Transdermal Q72H  . [START ON 09/07/2013] ferric gluconate (FERRLECIT/NULECIT) IV  62.5 mg Intravenous Q Wed-HD  . hydrocortisone cream   Topical TID  . losartan  50 mg Oral QHS  . multivitamin  1 tablet Oral QHS  . pantoprazole  40 mg Oral Daily  . rOPINIRole  1 mg Oral QHS  . sodium chloride  3 mL Intravenous Q12H      LOS: 4 days   Eliazar Olivar M.D. [facility] 09/06/2013, 10:58 AM Pager: [phone]  If 7PM-7AM, please contact night-coverage [email or web address] Password [email or web address]  **Disclaimer: This note was dictated with voice recognition software. Similar sounding words can inadvertently be transcribed and this note may contain transcription errors which may not have been corrected upon publication of note.**

## 2013-09-06 NOTE — Progress Notes (Signed)
[facility] Progress Note  Assessment/Plan: 1. Abdominal pain secondary to T10-11 - hx discitis treated July/Aug 2014 with Vanc and Fortaz - no + cultures of blood or aspirate at that time; Surgical plan pending bone density study.  Unable to aspirate with IR.  Not on ABX.  Pain controlled.  2. ESRD - Keep on MWF schedule.  No heparin.  3. Hypertension/volume -  Cont to challenge EDW, set at 51kg for tomorrow.   4. Anemia -  Cont maintenace Fe and Aranesp qWed; monitor 5. Metabolic bone disease - iPTH ^^ - her center is transitioning from hectorol to calcitriol 6. Nutrition - renal diet; 8 kg wt loss this past year - add supplement 7. Hx CVA 8. Thrombocytopenia - improved 9. Bilateral CTS - has difficulty holding things 10. Anxiety 11. Itching: add benadryl and hydrocortisone cream   Subjective:    Unable to have disc aspiration Bone density pending Pain controlled C/o itching over back  Objective Filed Vitals:   09/05/13 1123 09/05/13 1649 09/05/13 2129 09/06/13 0610  BP: 176/94 126/65 130/105 159/76  Pulse: 68 64 49 62  Temp: 97.4 F (36.3 C) 98.1 F (36.7 C) 97.4 F (36.3 C) 98.6 F (37 C)  TempSrc: Oral Oral Oral Oral  Resp: 20 20 18 16   Height:      Weight:      SpO2: 89% 97% 91% 97%   Physical Exam General: sitting in chair comfortably Heart: RRR Lungs: grossly clear Abdomen: soft Extremities: 1+ LE edema Dialysis Access: left upper AVF patent  Dialysis Orders: Center: [facility].  EDW 52.5 (8 kg wt loss x 1 year) HD Bath 2K/2Ca Time 3:30 Heparin NONE. Access LUA AVF BFR 400 DFR 600  Calcitriol 0.75 TIW Aranesp 25 q week- last given 8/12 Venofer 50 mg q week on Wed  Labs: Hgb 11, , PTH 1500  Additional Objective Labs: Basic Metabolic Panel:  Recent Labs Lab 09/02/13 2139 09/03/13 1648 09/05/13 0727  NA 133* 136* 136*  K 5.5* 4.4 5.5*  CL 94* 97 96  CO2 19 25 25   GLUCOSE 85 96 106*  BUN 30* 26* 42*  CREATININE 4.85* 3.75* 5.26*   CALCIUM 9.2 8.3* 8.4  PHOS  --  4.0 6.1*   Liver Function Tests:  Recent Labs Lab 09/03/13 1648 09/05/13 0727  ALBUMIN 3.5 3.4*  CBC:  Recent Labs Lab 09/02/13 2139 09/03/13 1648 09/05/13 0727  WBC 4.2 5.1 3.4*  HGB 11.4* 10.4* 10.5*  HCT 36.4 32.2* 33.5*  MCV 91.2 88.7 90.8  PLT 71* 118* 137*  Medications:   . amLODipine  10 mg Oral Daily  . calcitRIOL  0.75 mcg Oral Q M,W,F-HD  . calcium acetate  667 mg Oral TID WC  . carvedilol  12.5 mg Oral BID WC  . cinacalcet  30 mg Oral Q breakfast  . [START ON 09/07/2013] darbepoetin (ARANESP) injection - DIALYSIS  25 mcg Intravenous Q Wed-HD  . dexamethasone  4 mg Intravenous TID  . docusate sodium  100 mg Oral BID  . feeding supplement (NEPRO CARB STEADY)  237 mL Oral BID BM  . fentaNYL  25 mcg Transdermal Q72H  . [START ON 09/07/2013] ferric gluconate (FERRLECIT/NULECIT) IV  62.5 mg Intravenous Q Wed-HD  . hydrocortisone cream   Topical TID  . losartan  50 mg Oral QHS  . multivitamin  1 tablet Oral QHS  . pantoprazole  40 mg Oral Daily  . rOPINIRole  1 mg Oral QHS  .  sodium chloride  3 mL Intravenous Q12H

## 2013-09-07 LAB — CBC
HEMATOCRIT: 34.1 % — AB (ref 36.0–46.0)
HEMOGLOBIN: 10.7 g/dL — AB (ref 12.0–15.0)
MCH: 29.2 pg (ref 26.0–34.0)
MCHC: 31.4 g/dL (ref 30.0–36.0)
MCV: 92.9 fL (ref 78.0–100.0)
Platelets: 123 10*3/uL — ABNORMAL LOW (ref 150–400)
RBC: 3.67 MIL/uL — ABNORMAL LOW (ref 3.87–5.11)
RDW: 17.2 % — AB (ref 11.5–15.5)
WBC: 6 10*3/uL (ref 4.0–10.5)

## 2013-09-07 LAB — RENAL FUNCTION PANEL
ALBUMIN: 3.2 g/dL — AB (ref 3.5–5.2)
Anion gap: 18 — ABNORMAL HIGH (ref 5–15)
BUN: 58 mg/dL — AB (ref 6–23)
CHLORIDE: 96 meq/L (ref 96–112)
CO2: 25 mEq/L (ref 19–32)
Calcium: 8.8 mg/dL (ref 8.4–10.5)
Creatinine, Ser: 5.07 mg/dL — ABNORMAL HIGH (ref 0.50–1.10)
GFR calc non Af Amer: 7 mL/min — ABNORMAL LOW (ref >90)
GFR, EST AFRICAN AMERICAN: 9 mL/min — AB (ref >90)
GLUCOSE: 111 mg/dL — AB (ref 70–99)
PHOSPHORUS: 4.4 mg/dL (ref 2.3–4.6)
Potassium: 4.5 mEq/L (ref 3.7–5.3)
SODIUM: 139 meq/L (ref 137–147)

## 2013-09-07 MED ORDER — PENTAFLUOROPROP-TETRAFLUOROETH EX AERO: 1.0000 "application " | INHALATION_SPRAY | CUTANEOUS | Status: DC | PRN

## 2013-09-07 MED ORDER — SODIUM CHLORIDE 0.9 % IV SOLN: 100.0000 mL | INTRAVENOUS | Status: DC | PRN

## 2013-09-07 MED ORDER — LIDOCAINE-PRILOCAINE 2.5-2.5 % EX CREA
1.0000 "application " | TOPICAL_CREAM | CUTANEOUS | Status: DC | PRN
  Filled 2013-09-07: qty 5

## 2013-09-07 MED ORDER — HEPARIN SODIUM (PORCINE) 1000 UNIT/ML DIALYSIS: 1000.0000 [IU] | INTRAMUSCULAR | Status: DC | PRN

## 2013-09-07 MED ORDER — NEPRO/CARBSTEADY PO LIQD: 237.0000 mL | ORAL | Status: DC | PRN

## 2013-09-07 MED ORDER — ALTEPLASE 2 MG IJ SOLR: 2.0000 mg | Freq: Once | INTRAMUSCULAR | Status: DC | PRN

## 2013-09-07 MED ORDER — DARBEPOETIN ALFA-POLYSORBATE 25 MCG/0.42ML IJ SOLN
INTRAMUSCULAR | Status: AC
  Administered 2013-09-07: 25 ug
  Filled 2013-09-07: qty 0.42

## 2013-09-07 MED ORDER — LIDOCAINE HCL (PF) 1 % IJ SOLN: 5.0000 mL | INTRAMUSCULAR | Status: DC | PRN

## 2013-09-07 NOTE — Progress Notes (Signed)
Patient ID: Julia Small  female  MVH:[id number]    DOB: 1935-12-24    DOA: 09/02/2013  PCP: Lise AuerKHAN,JABER A, MD  Interim history Julia Small is a 78 y.o. female with ESRD on HD, HTN, h/o CVA presents with abdominal pain which she has been having for about 4 months now. She has had extensive GI work up and no etiology has been determined. She was in the ER on 08/22/13 and underwent a CTA of the abdomen which was negative for intra-abdominal pathology and she was discharged home with Oxycodone. She states that she is having to take the medication often and pain remains uncontrolled. She is unable to tell if its radiating from her back to her abdomen or vice versa. It feels like a ring around her lower abdomen and back. MRI of the thoracic spine showed discitis with severe spinal stenosis, mild cord edema. Patient has a history of discitis in the same region and was treated with [place] and vancomycin last year. Neurosurgery, Dr Venetia MaxonStern consulted and recommended reconstructive spine surgery. Bone density study pending which is needed prior to the reconstructive surgery. Patient also needs bone biopsy, cultures, AFB, fungal cultures. ID (Dr Luciana Axeomer) recommended holding antibiotics for now. IR was not able to do disc aspiration due to significant bone destruction in the area. Given her prior history of endocarditis last year, 2-D echo was done which showed no valvular vegetations or regurgitation, hence cleared for surgery  Assessment/Plan:   Abdominal pain/back pain likely referred from Spinal stenosis, thoracic with nerve root compression, discitis - MRI thoracic spine 8/14: discitis with posterior displacement of bone into the spinal canal, severe spinal stenosis with mild cord edema - Neurosurgery following Dr. Venetia MaxonStern, recommended reconstructive surgery and bone density study (STILL PENDING). -[facility]  - Patient had history of discitis and osteomyelitis in the  same area last year, completed course of Fortaz and Vanc.   - Given her constellation of symptoms and severe spinal stenosis with nerve root/ cord compression, concern for repeat discitis, she likely needs surgery, moderate to high risk.   Prior history of endocarditis last 2014 - 2-D echo : EF 60-65%, normal wall motion, severe focal thickening and calcification consistent with sclerosis of the aortic valve mild stenosis, no vegetations noted, no regurgitation. Patient is a moderate risk for spinal surgery, however need to rule out acute discitis, also has nerve root/cord compression in the same area, she needs the surgery, bone biopsy and cultures.    ESRD (end stage renal disease) on dialysis - Renal following, hemodialysis per schedule    HTN (hypertension) - Continue with Norvasc and Cozaar.  -Hold Coreg due to bradycardia.   Bradycardia; Asymptomatic.  hold coreg.     GERD (gastroesophageal reflux disease) - Continue PPI    Generalized weakness - To start physical therapy once cleared from neurosurgery   History of Bacterial endocarditis -2-D echo showed no obvious vegetations, patient cleared for neurosurgery  Thrombocytopenia - Continue SCDs, platelet count improving  DVT Prophylaxis:SCDs  Code Status:Full code  Family Communication:  Disposition:  Consultants:  Neurosurgery, dr Venetia MaxonStern  Infectious disease, Dr Luciana Axeomer  RENAL, Dr Marisue HumbleSanford  Procedures:  MRI  Antibiotics:  None  Subjective: Patient relate back pain is better, rate pain 5/10.   Objective: Weight change: -2 kg (-4 lb 6.6 oz)  Intake/Output Summary (Last 24 hours) at 09/07/13 1441 Last data filed at 09/07/13 1037  Gross per 24 hour  Intake  240 ml  Output   3000 ml  Net  -2760 ml   Blood pressure 163/77, pulse 47, temperature 97.9 F (36.6 C), temperature source Oral, resp. rate 19, height 4\' 10"  (1.473 m), weight 51.3 kg (113 lb 1.5 oz), SpO2 100.00%.  Physical Exam: General: Alert  and awake, oriented x3, NAD CVS: S1-S2 clear Chest: CTAB Abdomen: soft, NT, ND, normal bowel sounds  Extremities: no c/c/e   Lab Results: Basic Metabolic Panel:  Recent Labs Lab 09/05/13 0727 09/07/13 0658  NA 136* 139  K 5.5* 4.5  CL 96 96  CO2 25 25  GLUCOSE 106* 111*  BUN 42* 58*  CREATININE 5.26* 5.07*  CALCIUM 8.4 8.8  PHOS 6.1* 4.4   Liver Function Tests:  Recent Labs Lab 09/05/13 0727 09/07/13 0658  ALBUMIN 3.4* 3.2*   No results found for this basename: LIPASE, AMYLASE,  in the last 168 hours No results found for this basename: AMMONIA,  in the last 168 hours CBC:  Recent Labs Lab 09/05/13 0727 09/07/13 0658  WBC 3.4* 6.0  HGB 10.5* 10.7*  HCT 33.5* 34.1*  MCV 90.8 92.9  PLT 137* 123*   Cardiac Enzymes: No results found for this basename: CKTOTAL, CKMB, CKMBINDEX, TROPONINI,  in the last 168 hours BNP: No components found with this basename: POCBNP,  CBG: No results found for this basename: GLUCAP,  in the last 168 hours   Micro Results: Recent Results (from the past 240 hour(s))  CULTURE, BLOOD (ROUTINE X 2)     Status: None   Collection Time    09/03/13  1:10 PM      Result Value Ref Range Status   Specimen Description BLOOD RIGHT HAND   Final   Special Requests BOTTLES DRAWN AEROBIC ONLY 1CC   Final   Culture  Setup Time     Final   Value: 09/03/2013 18:00     Performed at [facility]   Culture     Final   Value:        BLOOD CULTURE RECEIVED NO GROWTH TO DATE CULTURE WILL BE HELD FOR 5 DAYS BEFORE ISSUING A FINAL NEGATIVE REPORT     Performed at [facility]   Report Status PENDING   Incomplete  CULTURE, BLOOD (ROUTINE X 2)     Status: None   Collection Time    09/04/13  1:05 PM      Result Value Ref Range Status   Specimen Description BLOOD RIGHT HAND   Final   Special Requests BOTTLES DRAWN AEROBIC ONLY 10CC   Final   Culture  Setup Time     Final   Value: 09/04/2013 19:00     Performed at [facility]    Culture     Final   Value:        BLOOD CULTURE RECEIVED NO GROWTH TO DATE CULTURE WILL BE HELD FOR 5 DAYS BEFORE ISSUING A FINAL NEGATIVE REPORT     Performed at [facility]   Report Status PENDING   Incomplete    Studies/Results: Mr Thoracic Spine Wo Contrast  09/02/2013   CLINICAL DATA:  T10-11 discitis. Evaluate for cord compression. No contrast due to diminished renal function.  EXAM: MRI THORACIC SPINE WITHOUT CONTRAST  TECHNIQUE: Multiplanar, multisequence MR imaging of the thoracic spine was performed. No intravenous contrast was administered.  COMPARISON:  CTA chest, abdomen, and pelvis 08/22/2013  FINDINGS: Images are mildly degraded by motion. There is mild exaggeration of the normal  thoracic kyphosis. There is mild left convex curvature of the thoracic spine.  As seen on multiple prior CT examinations, there are osseous destructive changes centered about the T10-11 disc space with advanced T10 and T11 vertebral body height loss. There is abnormal fluid signal within the T10-11 disc space and mild T10-11 vertebral marrow edema. Posterior displacement of bone into the spinal canal at T10-11 results in severe spinal stenosis, narrowing the AP diameter of the spinal canal to 6 mm. There is moderate flattening of the spinal cord with mild T2 hyperintensity in the spinal cord at and just below this level. Small ventral epidural fluid collections are questioned posterior to the T10 and T11 vertebral bodies. There is paravertebral soft tissue thickening/edema centered at T10-11. There is severe bilateral neural foraminal stenosis at T10-11 due to vertebral body erosion/collapse, posterior vertebral spurring, and facet arthrosis.  Uncovertebral hypertrophy at C6-7 results in at least mild left neural foraminal stenosis, incompletely imaged. Broad-based central disc protrusion at T3-4 does not result in spinal stenosis.  IMPRESSION: Destructive changes centered about the T10-11 disc space  consistent with discitis as previously described. Posterior displacement of bone into the spinal canal results in severe spinal stenosis with moderate flattening of the spinal cord and mild spinal cord edema. Small ventral epidural fluid collections/phlegmon at this level with intervertebral edema/phlegmon.   Electronically Signed   By: Sebastian Ache   On: 09/02/2013 21:25   Dg Abd Acute W/chest  08/22/2013   CLINICAL DATA:  Abdominal pain  EXAM: ACUTE ABDOMEN SERIES (ABDOMEN 2 VIEW & CHEST 1 VIEW)  COMPARISON:  Chest radiograph August 06, 2012; abdominal radiographs August 15, 2013  FINDINGS: PA chest: There is underlying interstitial fibrosis with scattered areas of scarring. There is no frank edema or consolidation. Heart is mildly enlarged with pulmonary vascularity within normal limits. No adenopathy. Aorta is prominent but stable.  Supine and upright abdomen: There is contrast in the colon. Bowel gas pattern is overall unremarkable. No obstruction or free air. There are multiple surgical clips and wires in the lower abdomen and pelvis.  IMPRESSION: Bowel gas pattern unremarkable. Lungs show evidence of interstitial fibrosis and scarring without frank edema or consolidation. Stable cardiac enlargement.   Electronically Signed   By: Bretta Bang M.D.   On: 08/22/2013 21:07   Ct Angio Chest Aortic Dissect W &/or W/o  08/23/2013   CLINICAL DATA:  Back and abdominal pain  EXAM: CT ANGIOGRAPHY CHEST, ABDOMEN AND PELVIS  TECHNIQUE: Multidetector CT imaging through the chest, abdomen and pelvis was performed using the standard protocol during bolus administration of intravenous contrast. Multiplanar reconstructed images and MIPs were obtained and reviewed to evaluate the vascular anatomy.  CONTRAST:  OMNIPAQUE IOHEXOL 350 MG/ML SOLN, OMNIPAQUE IOHEXOL 350 MG/ML SOLN  COMPARISON:  08/19/2013  FINDINGS: CTA CHEST FINDINGS  THORACIC INLET/BODY WALL:  Anasarca. Markedly enlarged, tortuous, and early  filling veins in the left upper extremity related to dialysis fistula or graft. There is venous narrowing at the level of the first rib crossing which could be positional. Left axillary adenopathy which could be congestive. No gross mass lesion in the left breast.  MEDIASTINUM:  Cardiomegaly. There is extensive atherosclerosis in the elongation of the aorta. No intramural hematoma, dissection, or aneurysm.  No adenopathy.  Negative esophagus.  No evidence of central pulmonary embolism. Main pulmonary arteries are enlarged compatible with pulmonary hypertension.  LUNG WINDOWS:  There is a persistent nodular opacity in the lateral segment right middle lobe  measuring 15 mm in maximal diameter. Capacity has coal less since 02/03/2013.  OSSEOUS:  No acute fracture.  No suspicious lytic or blastic lesions.  Review of the MIP images confirms the above findings.  CTA ABDOMEN AND PELVIS FINDINGS  BODY WALL: Anasarca.  Liver: No focal abnormality.  Biliary: No evidence of biliary obstruction or stone.  Pancreas: The main pancreatic duct remains chronically dilated up to 4 mm. No gross mass lesion or change from 05/07/2013.  Spleen: Unremarkable.  Adrenals: Unremarkable.  Kidneys and ureters: Atrophic and multi cystic kidneys compatible with dialysis related polycystic kidney disease.  Bladder: Decompressed.  Reproductive: Hysterectomy.  Bowel: Distal colonic diverticulosis. No bowel obstruction.  Retroperitoneum: No mass or adenopathy.  Peritoneum: No ascites or pneumoperitoneum.  Vascular: No aortic aneurysm or dissection. No major vessel stenosis or occlusion.  OSSEOUS: There is renal osteodystrophy with diffuse trabecular coarsening, subchondral erosions, and osteopenia. No change in extensive bone loss, compression deformity, and subchondral erosions at centered at the T10-11 disc. Osteophyte formation and anterolisthesis causes advanced canal stenosis at this level. Erosive changes to both facet joints at this level  could be inflammatory or from abnormal motion. This could represent dialysis related spondyloarthropathy or infectious discitis/osteomyelitis. Noted that the patient has undergone empiric antibiotic treatment. Stable subchondral erosive changes at L4-5.  Review of the MIP images confirms the above findings.  IMPRESSION: 1. Negative for aortic dissection. 2. Unchanged appearance of discitis at T10-11. Bone destruction and probable instability causes advanced spinal canal stenosis at this level. 3. 15 mm nodule in the right middle lobe which his gradually increased in size/density. Neoplastic nodule is a diagnostic possibility, consider six-month follow-up. 4. Multiple chronic/incidental findings are stable from prior and noted above.   Electronically Signed   By: Tiburcio Pea M.D.   On: 08/23/2013 00:23   Ct Cta Abd/pel W/cm &/or W/o Cm  08/23/2013   CLINICAL DATA:  Back and abdominal pain  EXAM: CT ANGIOGRAPHY CHEST, ABDOMEN AND PELVIS  TECHNIQUE: Multidetector CT imaging through the chest, abdomen and pelvis was performed using the standard protocol during bolus administration of intravenous contrast. Multiplanar reconstructed images and MIPs were obtained and reviewed to evaluate the vascular anatomy.  CONTRAST:  OMNIPAQUE IOHEXOL 350 MG/ML SOLN, OMNIPAQUE IOHEXOL 350 MG/ML SOLN  COMPARISON:  08/19/2013  FINDINGS: CTA CHEST FINDINGS  THORACIC INLET/BODY WALL:  Anasarca. Markedly enlarged, tortuous, and early filling veins in the left upper extremity related to dialysis fistula or graft. There is venous narrowing at the level of the first rib crossing which could be positional. Left axillary adenopathy which could be congestive. No gross mass lesion in the left breast.  MEDIASTINUM:  Cardiomegaly. There is extensive atherosclerosis in the elongation of the aorta. No intramural hematoma, dissection, or aneurysm.  No adenopathy.  Negative esophagus.  No evidence of central pulmonary embolism. Main  pulmonary arteries are enlarged compatible with pulmonary hypertension.  LUNG WINDOWS:  There is a persistent nodular opacity in the lateral segment right middle lobe measuring 15 mm in maximal diameter. Capacity has coal less since 02/03/2013.  OSSEOUS:  No acute fracture.  No suspicious lytic or blastic lesions.  Review of the MIP images confirms the above findings.  CTA ABDOMEN AND PELVIS FINDINGS  BODY WALL: Anasarca.  Liver: No focal abnormality.  Biliary: No evidence of biliary obstruction or stone.  Pancreas: The main pancreatic duct remains chronically dilated up to 4 mm. No gross mass lesion or change from 05/07/2013.  Spleen: Unremarkable.  Adrenals: Unremarkable.  Kidneys and ureters: Atrophic and multi cystic kidneys compatible with dialysis related polycystic kidney disease.  Bladder: Decompressed.  Reproductive: Hysterectomy.  Bowel: Distal colonic diverticulosis. No bowel obstruction.  Retroperitoneum: No mass or adenopathy.  Peritoneum: No ascites or pneumoperitoneum.  Vascular: No aortic aneurysm or dissection. No major vessel stenosis or occlusion.  OSSEOUS: There is renal osteodystrophy with diffuse trabecular coarsening, subchondral erosions, and osteopenia. No change in extensive bone loss, compression deformity, and subchondral erosions at centered at the T10-11 disc. Osteophyte formation and anterolisthesis causes advanced canal stenosis at this level. Erosive changes to both facet joints at this level could be inflammatory or from abnormal motion. This could represent dialysis related spondyloarthropathy or infectious discitis/osteomyelitis. Noted that the patient has undergone empiric antibiotic treatment. Stable subchondral erosive changes at L4-5.  Review of the MIP images confirms the above findings.  IMPRESSION: 1. Negative for aortic dissection. 2. Unchanged appearance of discitis at T10-11. Bone destruction and probable instability causes advanced spinal canal stenosis at this level.  3. 15 mm nodule in the right middle lobe which his gradually increased in size/density. Neoplastic nodule is a diagnostic possibility, consider six-month follow-up. 4. Multiple chronic/incidental findings are stable from prior and noted above.   Electronically Signed   By: Tiburcio Pea M.D.   On: 08/23/2013 00:23    Medications: Scheduled Meds: . amLODipine  10 mg Oral Daily  . calcitRIOL  0.75 mcg Oral Q M,W,F-HD  . calcium acetate  667 mg Oral TID WC  . cinacalcet  30 mg Oral Q breakfast  . darbepoetin (ARANESP) injection - DIALYSIS  25 mcg Intravenous Q Wed-HD  . dexamethasone  4 mg Intravenous TID  . docusate sodium  100 mg Oral BID  . feeding supplement (NEPRO CARB STEADY)  237 mL Oral BID BM  . fentaNYL  25 mcg Transdermal Q72H  . ferric gluconate (FERRLECIT/NULECIT) IV  62.5 mg Intravenous Q Wed-HD  . hydrocortisone cream   Topical TID  . losartan  50 mg Oral QHS  . multivitamin  1 tablet Oral QHS  . pantoprazole  40 mg Oral Daily  . rOPINIRole  1 mg Oral QHS  . sodium chloride  3 mL Intravenous Q12H      LOS: 5 days   Hartley Barefoot A M.D. [facility] 09/07/2013, 2:41 PM Pager: [phone]  If 7PM-7AM, please contact night-coverage [email or web address] Password [email or web address]  **Disclaimer: This note was dictated with voice recognition software. Similar sounding words can inadvertently be transcribed and this note may contain transcription errors which may not have been corrected upon publication of note.**

## 2013-09-07 NOTE — Procedures (Signed)
I was present at this dialysis session. I have reviewed the session itself and made appropriate changes.   Stable vitals on HD, mild ASx sinus bradycardia.  Tolerating treatment w/o difficulty.  Pain and itching controlled.   Julia Heckyan Sanford  MD 09/07/2013, 8:33 AM

## 2013-09-07 NOTE — Progress Notes (Signed)
Called care link([facility]) [facility] for her bone density study at 0915 ,09/08/13.Patient needs to be at that place before 0900.

## 2013-09-08 ENCOUNTER — Inpatient Hospital Stay (HOSPITAL_COMMUNITY): Payer: Medicare Other

## 2013-09-08 NOTE — Progress Notes (Signed)
Subjective:  Sitting up in chair reading paper,just back from [facility]  Objective Vital signs in last 24 hours: Filed Vitals:   09/07/13 1037 09/07/13 1100 09/07/13 2109 09/08/13 0617  BP: 144/84 163/77 107/66 145/68  Pulse: 54 47 51 63  Temp: 97.4 F (36.3 C) 97.9 F (36.6 C) 97.8 F (36.6 C) 98.1 F (36.7 C)  TempSrc: Oral Oral Oral Oral  Resp: 18 19 18 18   Height:      Weight: 51.3 kg (113 lb 1.5 oz)     SpO2: 100% 100% 100% 98%   Weight change: -0.6 kg (-1 lb 5.2 oz)  Physical Exam: General: NAD, elderly BF, Alert Heart: RRR  Lungs:  clear  bilat Abdomen: soft  Extremities:trace bipedal LE edema  Dialysis Access: left upper AVF pos bruit  Dialysis Orders: Center: [facility] on TTS.  EDW 52.5 (8 kg wt loss x 1 year) HD Bath 2K/2Ca Time 3:30 Heparin NONE. Access LUA AVF BFR 400 DFR 600  Calcitriol 0.75 TIW Aranesp 25 q week- last given 8/12 Venofer 50 mg q week on Wed  Labs: Hgb 11, , PTH 1500  Problem/Plan: 1. Abdominal pain secondary to T10-11 - hx discitis treated July/Aug 2014 with Vanc and Fortaz - no + cultures of blood or aspirate at that time;  Just back from Bone density study perSurgical plan . Unable to aspirate with IR. Not on ABX. Pain controlled.  2. ESRD - Keep on MWF schedule. No heparin.  3. Hypertension/volume - Cont to challenge EDW, set at 51kg for tomorrow also . Tolerate last hd 4. Anemia - Cont maintenace Fe and Aranesp qWed; monitor 5. Metabolic bone disease - iPTH ^^ - on  PO calcitriol [facility]d [facility] 6. Nutrition - alb 3.2/ renal diet;  On  Supplement/ renal vit 7. Hx CVA 8. Thrombocytopenia - improved 71>123 9. Bilateral CTS - has difficulty holding things but reading paper  10. Anxiety- stable 11. Itching:  Denies any now with add benadryl and hydrocortisone cream   Lenny Pastel, PA-C [facility] [phone] 09/08/2013,9:36 AM  LOS: 6 days   Labs: Basic Metabolic Panel:  Recent Labs Lab  09/03/13 1648 09/05/13 0727 09/07/13 0658  NA 136* 136* 139  K 4.4 5.5* 4.5  CL 97 96 96  CO2 25 25 25   GLUCOSE 96 106* 111*  BUN 26* 42* 58*  CREATININE 3.75* 5.26* 5.07*  CALCIUM 8.3* 8.4 8.8  PHOS 4.0 6.1* 4.4   Liver Function Tests:  Recent Labs Lab 09/03/13 1648 09/05/13 0727 09/07/13 0658  ALBUMIN 3.5 3.4* 3.2*  CBC:  Recent Labs Lab 09/02/13 2139 09/03/13 1648 09/05/13 0727 09/07/13 0658  WBC 4.2 5.1 3.4* 6.0  HGB 11.4* 10.4* 10.5* 10.7*  HCT 36.4 32.2* 33.5* 34.1*  MCV 91.2 88.7 90.8 92.9  PLT 71* 118* 137* 123*   Cardiac Enzymes: No results found for this basename: CKTOTAL, CKMB, CKMBINDEX, TROPONINI,  in the last 168 hours CBG: No results found for this basename: GLUCAP,  in the last 168 hours  Studies/Results: No results found. Medications:   . amLODipine  10 mg Oral Daily  . calcitRIOL  0.75 mcg Oral Q M,W,F-HD  . calcium acetate  667 mg Oral TID WC  . cinacalcet  30 mg Oral Q breakfast  . darbepoetin (ARANESP) injection - DIALYSIS  25 mcg Intravenous Q Wed-HD  . dexamethasone  4 mg Intravenous TID  . docusate sodium  100 mg Oral BID  .  feeding supplement (NEPRO CARB STEADY)  237 mL Oral BID BM  . fentaNYL  25 mcg Transdermal Q72H  . ferric gluconate (FERRLECIT/NULECIT) IV  62.5 mg Intravenous Q Wed-HD  . hydrocortisone cream   Topical TID  . losartan  50 mg Oral QHS  . multivitamin  1 tablet Oral QHS  . pantoprazole  40 mg Oral Daily  . rOPINIRole  1 mg Oral QHS  . sodium chloride  3 mL Intravenous Q12H     \

## 2013-09-08 NOTE — Progress Notes (Signed)
[facility] for Infectious Disease  Date of Admission:  09/02/2013  Antibiotics: none  Subjective: No acute events, has osteoporosis  Objective: Temp:  [97.8 F (36.6 C)-98.1 F (36.7 C)] 98 F (36.7 C) (08/20 1125) Pulse Rate:  [51-63] 60 (08/20 1125) Resp:  [18] 18 (08/20 1125) BP: (107-145)/(66-72) 135/72 mmHg (08/20 1125) SpO2:  [98 %-100 %] 98 % (08/20 1125)  General: awake Skin: no rashes Lungs: CTA   Lab Results Lab Results  Component Value Date   WBC 6.0 09/07/2013   HGB 10.7* 09/07/2013   HCT 34.1* 09/07/2013   MCV 92.9 09/07/2013   PLT 123* 09/07/2013    Lab Results  Component Value Date   CREATININE 5.07* 09/07/2013   BUN 58* 09/07/2013   NA 139 09/07/2013   K 4.5 09/07/2013   CL 96 09/07/2013   CO2 25 09/07/2013    Lab Results  Component Value Date   ALT 17 08/22/2013   AST 23 08/22/2013   ALKPHOS 146* 08/22/2013   BILITOT 0.4 08/22/2013      Microbiology: Recent Results (from the past 240 hour(s))  CULTURE, BLOOD (ROUTINE X 2)     Status: None   Collection Time    09/03/13  1:10 PM      Result Value Ref Range Status   Specimen Description BLOOD RIGHT HAND   Final   Special Requests BOTTLES DRAWN AEROBIC ONLY 1CC   Final   Culture  Setup Time     Final   Value: 09/03/2013 18:00     Performed at [facility]   Culture     Final   Value:        BLOOD CULTURE RECEIVED NO GROWTH TO DATE CULTURE WILL BE HELD FOR 5 DAYS BEFORE ISSUING A FINAL NEGATIVE REPORT     Performed at [facility]   Report Status PENDING   Incomplete  CULTURE, BLOOD (ROUTINE X 2)     Status: None   Collection Time    09/04/13  1:05 PM      Result Value Ref Range Status   Specimen Description BLOOD RIGHT HAND   Final   Special Requests BOTTLES DRAWN AEROBIC ONLY 10CC   Final   Culture  Setup Time     Final   Value: 09/04/2013 19:00     Performed at [facility]   Culture     Final   Value:        BLOOD CULTURE RECEIVED NO GROWTH TO DATE CULTURE WILL  BE HELD FOR 5 DAYS BEFORE ISSUING A FINAL NEGATIVE REPORT     Performed at [facility]   Report Status PENDING   Incomplete    Studies/Results: Dg Bone Density  09/08/2013   EXAM: DG DXA BONE DENSITY STUDY  The Bone Mineral Densitometry hard-copy report (which includes all data, graphical display, and FRAX results when applicable) has been sent directly to the ordering physician.  This report can also be obtained electronically by viewing images for this exam through the performing facility's EMR, or by logging directly into [employer].   Electronically Signed   By: Lowella Grip M.D.   On: 09/08/2013 09:54    Assessment/Plan: 1) Disc destruction - does not appear different than previously.  ESR reassuring.  No signs of infection. Improved pain and surgery not possible with poor bone quality.   -continue supportive care. I will sign off, call with questions  Makyle Eslick, Herbie [place], [place] for Infectious  Disease [facility] [email or web address] [phone] pager   [phone] cell 09/08/2013, 5:02 PM

## 2013-09-08 NOTE — Progress Notes (Signed)
I saw the patient and agree with the above assessment and plan.     Pt to not have operation.  Needs PT/OT, disposition plans.  HD tomorrow.  Pain controlled

## 2013-09-08 NOTE — Progress Notes (Signed)
Subjective: Patient reports "I don't hurt like I did. I just hope it doesn't come back"  Objective: Vital signs in last 24 hours: Temp:  [97.4 F (36.3 C)-98.1 F (36.7 C)] 98.1 F (36.7 C) (08/20 0617) Pulse Rate:  [47-63] 63 (08/20 0617) Resp:  [14-19] 18 (08/20 0617) BP: (106-163)/(66-84) 145/68 mmHg (08/20 0617) SpO2:  [98 %-100 %] 98 % (08/20 0617) Weight:  [51.3 kg (113 lb 1.5 oz)] 51.3 kg (113 lb 1.5 oz) (08/19 1037)  Intake/Output from previous day: 08/19 0701 - 08/20 0700 In: 360 [P.O.:360] Out: 3000  Intake/Output this shift:    [facility], stands from stretcher with assistance of one & ambulates to chair wearing TLSO.  Bone Density reveals osteoporosis.   Lab Results:  Recent Labs  09/07/13 0658  WBC 6.0  HGB 10.7*  HCT 34.1*  PLT 123*   BMET  Recent Labs  09/07/13 0658  NA 139  K 4.5  CL 96  CO2 25  GLUCOSE 111*  BUN 58*  CREATININE 5.07*  CALCIUM 8.8    Studies/Results: Dg Bone Density  09/08/2013   EXAM: DG DXA BONE DENSITY STUDY  The Bone Mineral Densitometry hard-copy report (which includes all data, graphical display, and FRAX results when applicable) has been sent directly to the ordering physician.  This report can also be obtained electronically by viewing images for this exam through the performing facility's EMR, or by logging directly into [facility] PACS.   Electronically Signed   By: Bretta BangWilliam  Woodruff M.D.   On: 09/08/2013 09:54    Assessment/Plan: Improved pain control   LOS: 6 days  Osteoporosis Dx makes surgical tx option less feasible.  Pt verbalizes understanding that poor bone quality makes secure bone fixation difficult, if posssible at all. She agrees to forego surgery, as bracing is currently providing significant pain relief with mobility.  She also verbalizes understanding that IVAB are necessary to complete in order to tx infection and further reduce pain.   Ok per Dr. Venetia MaxonStern for  PT/OT.  Neurosurgery will be available if needed. Pt may follow up on out-patient basis at office.   Georgiann Cockeroteat, Calypso Hagarty 09/08/2013, 10:29 AM

## 2013-09-08 NOTE — Progress Notes (Signed)
Surgery not possible given patient's poor bone quality.  Continue bracing and symptomatic treatment.

## 2013-09-08 NOTE — Progress Notes (Signed)
Patient ID: Julia Small  female  ZOX:[id number]    DOB: [birth year]    DOA: 09/02/2013  PCP: Lise Auer, MD  Interim history Julia Small is a 78 y.o. female with ESRD on HD, HTN, h/o CVA presents with abdominal pain which she has been having for about 4 months now. She has had extensive GI work up and no etiology has been determined. She was in the ER on 08/22/13 and underwent a CTA of the abdomen which was negative for intra-abdominal pathology and she was discharged home with Oxycodone. She states that she is having to take the medication often and pain remains uncontrolled. She is unable to tell if its radiating from her back to her abdomen or vice versa. It feels like a ring around her lower abdomen and back. MRI of the thoracic spine showed discitis with severe spinal stenosis, mild cord edema. Patient has a history of discitis in the same region and was treated with [place] and vancomycin last year. Neurosurgery, Dr Venetia Maxon consulted and recommended reconstructive spine surgery. Bone density study pending which is needed prior to the reconstructive surgery. Patient also needs bone biopsy, cultures, AFB, fungal cultures. ID (Dr Luciana Axe) recommended holding antibiotics for now. IR was not able to do disc aspiration due to significant bone destruction in the area.   Assessment/Plan:   Abdominal pain/back pain likely referred from Spinal stenosis, thoracic with nerve root compression, discitis - MRI thoracic spine 8/14: discitis with posterior displacement of bone into the spinal canal, severe spinal stenosis with mild cord edema - Neurosurgery following Dr. Venetia Maxon, recommended bone scan.  - Patient had history of discitis and osteomyelitis in the same area last year, completed course of Fortaz and Vanc.   -Bone scan per neurosurgery note  showed osteoporosis, this make bone fixation difficult.  -Will follow ID recommendation regarding antibiotics.  -PT, OT ordered.   Prior history of  endocarditis last 2014 - 2-D echo : EF 60-65%, normal wall motion, severe focal thickening and calcification consistent with sclerosis of the aortic valve mild stenosis, no vegetations noted, no regurgitation. Patient is a moderate risk for spinal surgery, however need to rule out acute discitis, also has nerve root/cord compression in the same area, she needs the surgery, bone biopsy and cultures.    ESRD (end stage renal disease) on dialysis - Renal following, hemodialysis per schedule    HTN (hypertension) - Continue with Norvasc and Cozaar.  -Hold Coreg due to bradycardia.   Bradycardia; Asymptomatic.  hold coreg. HR better.     GERD (gastroesophageal reflux disease) - Continue PPI    Generalized weakness - To start physical therapy once cleared from neurosurgery   History of Bacterial endocarditis -2-D echo showed no obvious vegetations.   Thrombocytopenia - Continue SCDs, platelet count improving  DVT Prophylaxis:SCDs  Code Status:Full code  Family Communication:  Disposition:  Consultants:  Neurosurgery, dr Venetia Maxon  Infectious disease, Dr Luciana Axe  RENAL, Dr Marisue Humble  Procedures:  MRI  Antibiotics:  None  Subjective: Relates pain is better, no others complaint.   Objective: Weight change: -0.6 kg (-1 lb 5.2 oz)  Intake/Output Summary (Last 24 hours) at 09/08/13 1215 Last data filed at 09/07/13 2113  Gross per 24 hour  Intake    360 ml  Output      0 ml  Net    360 ml   Blood pressure 145/68, pulse 63, temperature 98.1 F (36.7 C), temperature source Oral, resp. rate 18, height 4\' 10"  (1.473 m), weight  51.3 kg (113 lb 1.5 oz), SpO2 98.00%.  Physical Exam: General: Alert and awake, oriented x3, NAD CVS: S1-S2 clear Chest: CTAB Abdomen: soft, NT, ND, normal bowel sounds  Extremities: no c/c/e   Lab Results: Basic Metabolic Panel:  Recent Labs Lab 09/05/13 0727 09/07/13 0658  NA 136* 139  K 5.5* 4.5  CL 96 96  CO2 25 25  GLUCOSE 106*  111*  BUN 42* 58*  CREATININE 5.26* 5.07*  CALCIUM 8.4 8.8  PHOS 6.1* 4.4   Liver Function Tests:  Recent Labs Lab 09/05/13 0727 09/07/13 0658  ALBUMIN 3.4* 3.2*   No results found for this basename: LIPASE, AMYLASE,  in the last 168 hours No results found for this basename: AMMONIA,  in the last 168 hours CBC:  Recent Labs Lab 09/05/13 0727 09/07/13 0658  WBC 3.4* 6.0  HGB 10.5* 10.7*  HCT 33.5* 34.1*  MCV 90.8 92.9  PLT 137* 123*   Cardiac Enzymes: No results found for this basename: CKTOTAL, CKMB, CKMBINDEX, TROPONINI,  in the last 168 hours BNP: No components found with this basename: POCBNP,  CBG: No results found for this basename: GLUCAP,  in the last 168 hours   Micro Results: Recent Results (from the past 240 hour(s))  CULTURE, BLOOD (ROUTINE X 2)     Status: None   Collection Time    09/03/13  1:10 PM      Result Value Ref Range Status   Specimen Description BLOOD RIGHT HAND   Final   Special Requests BOTTLES DRAWN AEROBIC ONLY 1CC   Final   Culture  Setup Time     Final   Value: 09/03/2013 18:00     Performed at [facility]   Culture     Final   Value:        BLOOD CULTURE RECEIVED NO GROWTH TO DATE CULTURE WILL BE HELD FOR 5 DAYS BEFORE ISSUING A FINAL NEGATIVE REPORT     Performed at [facility]   Report Status PENDING   Incomplete  CULTURE, BLOOD (ROUTINE X 2)     Status: None   Collection Time    09/04/13  1:05 PM      Result Value Ref Range Status   Specimen Description BLOOD RIGHT HAND   Final   Special Requests BOTTLES DRAWN AEROBIC ONLY 10CC   Final   Culture  Setup Time     Final   Value: 09/04/2013 19:00     Performed at [facility]   Culture     Final   Value:        BLOOD CULTURE RECEIVED NO GROWTH TO DATE CULTURE WILL BE HELD FOR 5 DAYS BEFORE ISSUING A FINAL NEGATIVE REPORT     Performed at [facility]   Report Status PENDING   Incomplete    Studies/Results: Mr Thoracic Spine Wo  Contrast  09/02/2013   CLINICAL DATA:  T10-11 discitis. Evaluate for cord compression. No contrast due to diminished renal function.  EXAM: MRI THORACIC SPINE WITHOUT CONTRAST  TECHNIQUE: Multiplanar, multisequence MR imaging of the thoracic spine was performed. No intravenous contrast was administered.  COMPARISON:  CTA chest, abdomen, and pelvis 08/22/2013  FINDINGS: Images are mildly degraded by motion. There is mild exaggeration of the normal thoracic kyphosis. There is mild left convex curvature of the thoracic spine.  As seen on multiple prior CT examinations, there are osseous destructive changes centered about the T10-11 disc space with advanced T10 and T11 vertebral  body height loss. There is abnormal fluid signal within the T10-11 disc space and mild T10-11 vertebral marrow edema. Posterior displacement of bone into the spinal canal at T10-11 results in severe spinal stenosis, narrowing the AP diameter of the spinal canal to 6 mm. There is moderate flattening of the spinal cord with mild T2 hyperintensity in the spinal cord at and just below this level. Small ventral epidural fluid collections are questioned posterior to the T10 and T11 vertebral bodies. There is paravertebral soft tissue thickening/edema centered at T10-11. There is severe bilateral neural foraminal stenosis at T10-11 due to vertebral body erosion/collapse, posterior vertebral spurring, and facet arthrosis.  Uncovertebral hypertrophy at C6-7 results in at least mild left neural foraminal stenosis, incompletely imaged. Broad-based central disc protrusion at T3-4 does not result in spinal stenosis.  IMPRESSION: Destructive changes centered about the T10-11 disc space consistent with discitis as previously described. Posterior displacement of bone into the spinal canal results in severe spinal stenosis with moderate flattening of the spinal cord and mild spinal cord edema. Small ventral epidural fluid collections/phlegmon at this level with  intervertebral edema/phlegmon.   Electronically Signed   By: Sebastian Ache   On: 09/02/2013 21:25   Dg Abd Acute W/chest  08/22/2013   CLINICAL DATA:  Abdominal pain  EXAM: ACUTE ABDOMEN SERIES (ABDOMEN 2 VIEW & CHEST 1 VIEW)  COMPARISON:  Chest radiograph August 06, 2012; abdominal radiographs August 15, 2013  FINDINGS: PA chest: There is underlying interstitial fibrosis with scattered areas of scarring. There is no frank edema or consolidation. Heart is mildly enlarged with pulmonary vascularity within normal limits. No adenopathy. Aorta is prominent but stable.  Supine and upright abdomen: There is contrast in the colon. Bowel gas pattern is overall unremarkable. No obstruction or free air. There are multiple surgical clips and wires in the lower abdomen and pelvis.  IMPRESSION: Bowel gas pattern unremarkable. Lungs show evidence of interstitial fibrosis and scarring without frank edema or consolidation. Stable cardiac enlargement.   Electronically Signed   By: Bretta Bang M.D.   On: 08/22/2013 21:07   Ct Angio Chest Aortic Dissect W &/or W/o  08/23/2013   CLINICAL DATA:  Back and abdominal pain  EXAM: CT ANGIOGRAPHY CHEST, ABDOMEN AND PELVIS  TECHNIQUE: Multidetector CT imaging through the chest, abdomen and pelvis was performed using the standard protocol during bolus administration of intravenous contrast. Multiplanar reconstructed images and MIPs were obtained and reviewed to evaluate the vascular anatomy.  CONTRAST:  OMNIPAQUE IOHEXOL 350 MG/ML SOLN, OMNIPAQUE IOHEXOL 350 MG/ML SOLN  COMPARISON:  08/19/2013  FINDINGS: CTA CHEST FINDINGS  THORACIC INLET/BODY WALL:  Anasarca. Markedly enlarged, tortuous, and early filling veins in the left upper extremity related to dialysis fistula or graft. There is venous narrowing at the level of the first rib crossing which could be positional. Left axillary adenopathy which could be congestive. No gross mass lesion in the left breast.  MEDIASTINUM:   Cardiomegaly. There is extensive atherosclerosis in the elongation of the aorta. No intramural hematoma, dissection, or aneurysm.  No adenopathy.  Negative esophagus.  No evidence of central pulmonary embolism. Main pulmonary arteries are enlarged compatible with pulmonary hypertension.  LUNG WINDOWS:  There is a persistent nodular opacity in the lateral segment right middle lobe measuring 15 mm in maximal diameter. Capacity has coal less since 02/03/2013.  OSSEOUS:  No acute fracture.  No suspicious lytic or blastic lesions.  Review of the MIP images confirms the above findings.  CTA  ABDOMEN AND PELVIS FINDINGS  BODY WALL: Anasarca.  Liver: No focal abnormality.  Biliary: No evidence of biliary obstruction or stone.  Pancreas: The main pancreatic duct remains chronically dilated up to 4 mm. No gross mass lesion or change from 05/07/2013.  Spleen: Unremarkable.  Adrenals: Unremarkable.  Kidneys and ureters: Atrophic and multi cystic kidneys compatible with dialysis related polycystic kidney disease.  Bladder: Decompressed.  Reproductive: Hysterectomy.  Bowel: Distal colonic diverticulosis. No bowel obstruction.  Retroperitoneum: No mass or adenopathy.  Peritoneum: No ascites or pneumoperitoneum.  Vascular: No aortic aneurysm or dissection. No major vessel stenosis or occlusion.  OSSEOUS: There is renal osteodystrophy with diffuse trabecular coarsening, subchondral erosions, and osteopenia. No change in extensive bone loss, compression deformity, and subchondral erosions at centered at the T10-11 disc. Osteophyte formation and anterolisthesis causes advanced canal stenosis at this level. Erosive changes to both facet joints at this level could be inflammatory or from abnormal motion. This could represent dialysis related spondyloarthropathy or infectious discitis/osteomyelitis. Noted that the patient has undergone empiric antibiotic treatment. Stable subchondral erosive changes at L4-5.  Review of the MIP images  confirms the above findings.  IMPRESSION: 1. Negative for aortic dissection. 2. Unchanged appearance of discitis at T10-11. Bone destruction and probable instability causes advanced spinal canal stenosis at this level. 3. 15 mm nodule in the right middle lobe which his gradually increased in size/density. Neoplastic nodule is a diagnostic possibility, consider six-month follow-up. 4. Multiple chronic/incidental findings are stable from prior and noted above.   Electronically Signed   By: Tiburcio PeaJonathan  Watts M.D.   On: 08/23/2013 00:23   Ct Cta Abd/pel W/cm &/or W/o Cm  08/23/2013   CLINICAL DATA:  Back and abdominal pain  EXAM: CT ANGIOGRAPHY CHEST, ABDOMEN AND PELVIS  TECHNIQUE: Multidetector CT imaging through the chest, abdomen and pelvis was performed using the standard protocol during bolus administration of intravenous contrast. Multiplanar reconstructed images and MIPs were obtained and reviewed to evaluate the vascular anatomy.  CONTRAST:  100mL OMNIPAQUE IOHEXOL 350 MG/ML SOLN, 100mL OMNIPAQUE IOHEXOL 350 MG/ML SOLN  COMPARISON:  08/19/2013  FINDINGS: CTA CHEST FINDINGS  THORACIC INLET/BODY WALL:  Anasarca. Markedly enlarged, tortuous, and early filling veins in the left upper extremity related to dialysis fistula or graft. There is venous narrowing at the level of the first rib crossing which could be positional. Left axillary adenopathy which could be congestive. No gross mass lesion in the left breast.  MEDIASTINUM:  Cardiomegaly. There is extensive atherosclerosis in the elongation of the aorta. No intramural hematoma, dissection, or aneurysm.  No adenopathy.  Negative esophagus.  No evidence of central pulmonary embolism. Main pulmonary arteries are enlarged compatible with pulmonary hypertension.  LUNG WINDOWS:  There is a persistent nodular opacity in the lateral segment right middle lobe measuring 15 mm in maximal diameter. Capacity has coal less since 02/03/2013.  OSSEOUS:  No acute fracture.  No  suspicious lytic or blastic lesions.  Review of the MIP images confirms the above findings.  CTA ABDOMEN AND PELVIS FINDINGS  BODY WALL: Anasarca.  Liver: No focal abnormality.  Biliary: No evidence of biliary obstruction or stone.  Pancreas: The main pancreatic duct remains chronically dilated up to 4 mm. No gross mass lesion or change from 05/07/2013.  Spleen: Unremarkable.  Adrenals: Unremarkable.  Kidneys and ureters: Atrophic and multi cystic kidneys compatible with dialysis related polycystic kidney disease.  Bladder: Decompressed.  Reproductive: Hysterectomy.  Bowel: Distal colonic diverticulosis. No bowel obstruction.  Retroperitoneum: No mass or  adenopathy.  Peritoneum: No ascites or pneumoperitoneum.  Vascular: No aortic aneurysm or dissection. No major vessel stenosis or occlusion.  OSSEOUS: There is renal osteodystrophy with diffuse trabecular coarsening, subchondral erosions, and osteopenia. No change in extensive bone loss, compression deformity, and subchondral erosions at centered at the T10-11 disc. Osteophyte formation and anterolisthesis causes advanced canal stenosis at this level. Erosive changes to both facet joints at this level could be inflammatory or from abnormal motion. This could represent dialysis related spondyloarthropathy or infectious discitis/osteomyelitis. Noted that the patient has undergone empiric antibiotic treatment. Stable subchondral erosive changes at L4-5.  Review of the MIP images confirms the above findings.  IMPRESSION: 1. Negative for aortic dissection. 2. Unchanged appearance of discitis at T10-11. Bone destruction and probable instability causes advanced spinal canal stenosis at this level. 3. 15 mm nodule in the right middle lobe which his gradually increased in size/density. Neoplastic nodule is a diagnostic possibility, consider six-month follow-up. 4. Multiple chronic/incidental findings are stable from prior and noted above.   Electronically Signed   By:  Tiburcio Pea M.D.   On: 08/23/2013 00:23    Medications: Scheduled Meds: . amLODipine  10 mg Oral Daily  . calcitRIOL  0.75 mcg Oral Q M,W,F-HD  . calcium acetate  667 mg Oral TID WC  . cinacalcet  30 mg Oral Q breakfast  . darbepoetin (ARANESP) injection - DIALYSIS  25 mcg Intravenous Q Wed-HD  . dexamethasone  4 mg Intravenous TID  . docusate sodium  100 mg Oral BID  . feeding supplement (NEPRO CARB STEADY)  237 mL Oral BID BM  . fentaNYL  25 mcg Transdermal Q72H  . ferric gluconate (FERRLECIT/NULECIT) IV  62.5 mg Intravenous Q Wed-HD  . hydrocortisone cream   Topical TID  . losartan  50 mg Oral QHS  . multivitamin  1 tablet Oral QHS  . pantoprazole  40 mg Oral Daily  . rOPINIRole  1 mg Oral QHS  . sodium chloride  3 mL Intravenous Q12H      LOS: 6 days   Hartley Barefoot A M.D. [facility] 09/08/2013, 12:15 PM Pager: [phone]  If 7PM-7AM, please contact night-coverage [email or web address] Password [email or web address]  **Disclaimer: This note was dictated with voice recognition software. Similar sounding words can inadvertently be transcribed and this note may contain transcription errors which may not have been corrected upon publication of note.**

## 2013-09-09 LAB — CULTURE, BLOOD (ROUTINE X 2): Culture: NO GROWTH

## 2013-09-09 LAB — CBC
HEMATOCRIT: 35.7 % — AB (ref 36.0–46.0)
Hemoglobin: 11.5 g/dL — ABNORMAL LOW (ref 12.0–15.0)
MCH: 29.7 pg (ref 26.0–34.0)
MCHC: 32.2 g/dL (ref 30.0–36.0)
MCV: 92.2 fL (ref 78.0–100.0)
Platelets: 135 10*3/uL — ABNORMAL LOW (ref 150–400)
RBC: 3.87 MIL/uL (ref 3.87–5.11)
RDW: 17.5 % — AB (ref 11.5–15.5)
WBC: 7.6 10*3/uL (ref 4.0–10.5)

## 2013-09-09 LAB — RENAL FUNCTION PANEL
Albumin: 3.2 g/dL — ABNORMAL LOW (ref 3.5–5.2)
Anion gap: 19 — ABNORMAL HIGH (ref 5–15)
BUN: 70 mg/dL — ABNORMAL HIGH (ref 6–23)
CO2: 22 meq/L (ref 19–32)
Calcium: 8.6 mg/dL (ref 8.4–10.5)
Chloride: 94 meq/L — ABNORMAL LOW (ref 96–112)
Creatinine, Ser: 5.53 mg/dL — ABNORMAL HIGH (ref 0.50–1.10)
GFR calc Af Amer: 8 mL/min — ABNORMAL LOW
GFR calc non Af Amer: 7 mL/min — ABNORMAL LOW
Glucose, Bld: 125 mg/dL — ABNORMAL HIGH (ref 70–99)
Phosphorus: 4.2 mg/dL (ref 2.3–4.6)
Potassium: 4.1 meq/L (ref 3.7–5.3)
Sodium: 135 meq/L — ABNORMAL LOW (ref 137–147)

## 2013-09-09 MED ORDER — DICLOFENAC SODIUM 1 % TD GEL: 2.0000 g | TRANSDERMAL | Status: AC | PRN

## 2013-09-09 MED ORDER — DEXAMETHASONE 4 MG PO TABS
ORAL_TABLET | ORAL | Status: DC
Start: 2013-09-09 — End: 2013-12-16

## 2013-09-09 MED ORDER — CARVEDILOL 3.125 MG PO TABS
3.1250 mg | ORAL_TABLET | Freq: Two times a day (BID) | ORAL | Status: DC
  Filled 2013-09-09 (×2): qty 1

## 2013-09-09 MED ORDER — OXYCODONE HCL 5 MG PO TABS: 5.0000 mg | ORAL_TABLET | ORAL | Status: DC | PRN

## 2013-09-09 MED ORDER — PANTOPRAZOLE SODIUM 40 MG PO TBEC
40.0000 mg | DELAYED_RELEASE_TABLET | Freq: Once | ORAL | Status: AC
  Administered 2013-09-09: 40 mg via ORAL

## 2013-09-09 MED ORDER — CARVEDILOL 3.125 MG PO TABS: 3.1250 mg | ORAL_TABLET | Freq: Two times a day (BID) | ORAL | Status: DC

## 2013-09-09 MED ORDER — CALCITRIOL 0.25 MCG PO CAPS: 0.7500 ug | ORAL_CAPSULE | ORAL | Status: AC

## 2013-09-09 MED ORDER — NEPRO/CARBSTEADY PO LIQD: 237.0000 mL | Freq: Two times a day (BID) | ORAL | Status: AC

## 2013-09-09 NOTE — Progress Notes (Signed)
OT Cancellation Note  Patient Details Name: Julia EdwardsBarbara Small MRN: [id number] DOB: 01/14/1936   Cancelled Treatment:    Reason Eval/Treat Not Completed: Patient at procedure or test/ unavailable (HD). Will re-attempt later today as time allows.  09/09/2013 Cipriano MileJohnson, Jenna Elizabeth OTR/L Pager [phone] Office [phone]

## 2013-09-09 NOTE — Progress Notes (Signed)
Discharge instructions reviewed with pt; allowing time for questions. Pt verbalized understanding. IV removed without issue. Tele removed and CCMD notified of pt's discharge. Pt to leave unit via ambulance service.

## 2013-09-09 NOTE — Clinical Social Work Note (Signed)
Patient ready for discharge and requested assistance with transportation.  CSW talked with patient and she reported that her son drives a truck is not available and her grandchildren do not help her. Patient informed that she will be transported home by ambulance.  Genelle BalVanessa Markeita Alicia, MSW, LCSW [phone]

## 2013-09-09 NOTE — Progress Notes (Signed)
PT Cancellation Note   Patient Details  Name: Julia EdwardsBarbara Akerson  MRN: [id number]  DOB: 1935-03-14  Cancelled Treatment: Reason Eval/Treat Not Completed: Patient at procedure or test/ unavailable (HD). Will re-attempt later today as time allows.   [address] [place], [place] [phone]

## 2013-09-09 NOTE — Procedures (Signed)
I was present at this dialysis session. I have reviewed the session itself and made appropriate changes.   Pt w/o c/o.  UF goal 2.5L. AVF.  No new issues.  Needs PT/OT, disposition  Sabra Heckyan Courtland Coppa  MD 09/09/2013, 8:50 AM

## 2013-09-09 NOTE — Evaluation (Signed)
Physical Therapy Evaluation Patient Details Name: Julia Small MRN: [id number] DOB: 1935-10-22 Today's Date: 09/09/2013   History of Present Illness  78 y.o. female admitted with abdominal pain. Current problem list includes: ESRD, anemia, metabolic bone disease, with Hx of CVA.  Clinical Impression  Pt admitted with above. Pt currently with functional limitations due to the deficits listed below (see PT Problem List). Had lengthy discussion with patient concerning her frequent falls at home and being more compliant with her rollator use. She refuses to go to SNF but is willing to go home with HHPT for balance training. Overall patient is ambulating quite well while using her rollator but is a fall risk without the device. Pt will benefit from skilled PT to increase their independence and safety with mobility to allow discharge to the venue listed below.       Follow Up Recommendations Home health PT;Supervision - Intermittent    Equipment Recommendations  3in1 (PT)    Recommendations for Other Services       Precautions / Restrictions Precautions Precautions: Fall Required Braces or Orthoses: Spinal Brace Restrictions Weight Bearing Restrictions: No      Mobility  Bed Mobility                  Transfers Overall transfer level: Modified independent Equipment used: None;4-wheeled walker             General transfer comment: Mod I with sit<>stand from reclining chair with and without rollator for support upon standing. Appears more stable with use of rollator.  Ambulation/Gait Ambulation/Gait assistance: Supervision Ambulation Distance (Feet): 120 Feet Assistive device: 4-wheeled walker Gait Pattern/deviations: Step-through pattern;Decreased stride length;Drifts right/left   Gait velocity interpretation: Below normal speed for age/gender General Gait Details: Ambulates generally well with use of rollator. Required one standing rest break during ambulatory  bout. No loss of balance. VC for upright posture.  Stairs            Wheelchair Mobility    Modified Rankin (Stroke Patients Only)       Balance Overall balance assessment: Needs assistance Sitting-balance support: No upper extremity supported;Feet supported Sitting balance-Leahy Scale: Good     Standing balance support: No upper extremity supported Standing balance-Leahy Scale: Fair                               Pertinent Vitals/Pain Pain Assessment: 0-10 Pain Score: 4  Pain Location: left flank Pain Intervention(s): Limited activity within patient's tolerance;Monitored during session;Repositioned    Home Living Family/patient expects to be discharged to:: Private residence Living Arrangements: Alone Available Help at Discharge: Family;Available PRN/intermittently (aide visits 2x per week) Type of Home: Apartment Home Access: Level entry     Home Layout: One level Home Equipment: Walker - 4 wheels;Grab bars - toilet      Prior Function Level of Independence: Needs assistance   Gait / Transfers Assistance Needed: uses rollator for ambulation  ADL's / Homemaking Assistance Needed: performs bath/dress independently. bathes at sink. Has aide clean home 2x/week.        Hand Dominance   Dominant Hand: Right    Extremity/Trunk Assessment   Upper Extremity Assessment: Defer to OT evaluation           Lower Extremity Assessment: Generalized weakness         Communication   Communication: No difficulties  Cognition Arousal/Alertness: Awake/alert Behavior During Therapy: WFL for tasks assessed/performed Overall Cognitive Status:  Within Functional Limits for tasks assessed                      General Comments General comments (skin integrity, edema, etc.): Pt complains of left flank pain, tender to palpation lower ribs on left. Spent considerable amount of time discussing d/c planning with patient. She admits to frequent falls  at home when not using rollator. States she just does not like to use the rollator in her house. Explained her high risk of fall and resulting Fx if she is not compliant with use of assitive device for ambulation. Refuses further rehabilitation at SNF but agreeable to HHPT upon d/c.    Exercises        Assessment/Plan    PT Assessment Patient needs continued PT services  PT Diagnosis Generalized weakness;Abnormality of gait;Difficulty walking;Acute pain   PT Problem List Decreased strength;Decreased range of motion;Decreased activity tolerance;Decreased balance;Decreased mobility;Decreased knowledge of use of DME;Decreased safety awareness;Pain  PT Treatment Interventions DME instruction;Gait training;Functional mobility training;Therapeutic activities;Therapeutic exercise;Balance training;Neuromuscular re-education;Patient/family education;Cognitive remediation;Modalities   PT Goals (Current goals can be found in the Care Plan section) Acute Rehab PT Goals Patient Stated Goal: go home today PT Goal Formulation: With patient Time For Goal Achievement: 09/16/13 Potential to Achieve Goals: Fair    Frequency Min 3X/week   Barriers to discharge Decreased caregiver support pt lives alone    Co-evaluation               End of Session   Activity Tolerance: Patient tolerated treatment well Patient left: in chair;with call bell/phone within reach           Time: 1436-1507 PT Time Calculation (min): 31 min   Charges:   PT Evaluation $Initial PT Evaluation Tier I: 1 Procedure PT Treatments $Gait Training: 8-22 mins   PT G Codes:         Charlsie MerlesLogan Secor Alvester Eads, [place] [phone]  [place], Keric Zehren S 09/09/2013, 4:42 PM

## 2013-09-09 NOTE — Care Management Note (Signed)
CARE MANAGEMENT NOTE 09/09/2013  Patient:  Julia Small, Julia Small   Account Number:  [id number]  Date Initiated:  09/05/2013  Documentation initiated by:  [facility]  Subjective/Objective Assessment:   Abdominal pain/back     Action/Plan:   09/09/2013 Met with pt who selected [facility] for [place]. Pt states that she has a [place] aide who comes 2xper week, her doctor arranged, she does not know the agency or how this is paid for.   Anticipated DC Date:  09/09/2013   Anticipated DC Plan:  [place]  CM consult      Choice offered to / List presented to:          [facility] arranged  HH-1 RN  HH-2 PT      [facility] agency  [place]   Status of service:  Completed, signed off Medicare Important Message given?  YES (If response is "NO", the following Medicare IM given date fields will be blank) Date Medicare IM given:  09/05/2013 Medicare IM given by:  [facility] Date Additional Medicare IM given:  09/09/2013 Additional Medicare IM given by:  [facility]  Discharge Disposition:  [place]  Per UR Regulation:    If discussed at Long Length of Stay Meetings, dates discussed:    Comments:  09/09/2013 Per pt RN pt has a order for 3:1 commode, however this pt reports to this CM that she has bars beside her commode and in the tub at her apartment. Will ask for this to be held and ask [facility] agency to review pt apartment for needs when they vist this pt home.    Jasmine Pang RN MPH, case manager, [phone]

## 2013-09-09 NOTE — Discharge Summary (Signed)
Physician Discharge Summary  Julia Small BJY:[id number] DOB: 27-Jul-1935 DOA: 09/02/2013  PCP: Lise Auer, MD  Admit date: 09/02/2013 Discharge date: 09/09/2013  Time spent: 35 minutes  Recommendations for Outpatient Follow-up:  1. Follow up with Dr Venetia Maxon for further care spinal stenosis.   Discharge Diagnoses:    Abdominal pain, referred from spinal pathology   ESRD (end stage renal disease) on dialysis   Diskitis   HTN (hypertension)   GERD (gastroesophageal reflux disease)   Pain in back   Generalized weakness   Bacterial endocarditis   Spinal stenosis, thoracic with nerve root compression   Discharge Condition: Stable.   Diet recommendation: Renal diet.   Filed Weights   09/07/13 1037 09/09/13 0715 09/09/13 1110  Weight: 51.3 kg (113 lb 1.5 oz) 53.5 kg (117 lb 15.1 oz) 51 kg (112 lb 7 oz)    History of present illness:  Julia Small is a 78 y.o. female with ESRD on HD, HTN, h/o CVA presents with abdominal pain which she has been having for about 4 months now. She has had extensive GI work up and no etiology has been determined. She was in the ER on 08/22/13 and underwent a CTA of the abdomen which was negative for intra-abdominal pathology and she was discharged home with Oxycodone. She states that she is having to take the medication often and pain remains uncontrolled. She is unable to tell if its radiating from her back to her abdomen or vice versa. It feels like a ring around her lower abdomen and back. She has also had frequent vomiting and some constipation.   Hospital Course:  Interim history  Julia Small is a 78 y.o. female with ESRD on HD, HTN, h/o CVA presents with abdominal pain which she has been having for about 4 months now. She has had extensive GI work up and no etiology has been determined. She was in the ER on 08/22/13 and underwent a CTA of the abdomen which was negative for intra-abdominal pathology and she was discharged home with Oxycodone. She  states that she is having to take the medication often and pain remains uncontrolled. She is unable to tell if its radiating from her back to her abdomen or vice versa. It feels like a ring around her lower abdomen and back.  MRI of the thoracic spine showed discitis with severe spinal stenosis, mild cord edema. Patient has a history of discitis in the same region and was treated with [place] and vancomycin last year. Neurosurgery, Dr Venetia Maxon consulted and recommended reconstructive spine surgery. Bone density study pending which is needed prior to the reconstructive surgery. Patient also needs bone biopsy, cultures, AFB, fungal cultures. ID (Dr Luciana Axe) recommended holding antibiotics for now.  IR was not able to do disc aspiration due to significant bone destruction in the area.   Assessment/Plan:  Abdominal pain/back pain likely referred from Spinal stenosis, thoracic with nerve root compression, discitis  - MRI thoracic spine 8/14: discitis with posterior displacement of bone into the spinal canal, severe spinal stenosis with mild cord edema  - Neurosurgery following Dr. Venetia Maxon, recommended bone scan.  - Patient had history of discitis and osteomyelitis in the same area last year, completed course of Fortaz and Vanc.  -Bone scan per neurosurgery note showed osteoporosis, this make bone fixation difficult.  -no Antibiotics needed, her spinal finding is thought to be chronic.  -She will be discharge on decadron taper.  -Patient decline SNF.   Prior history of endocarditis last 2014  -  2-D echo : EF 60-65%, normal wall motion, severe focal thickening and calcification consistent with sclerosis of the aortic valve mild stenosis, no vegetations noted, no regurgitation.   ESRD (end stage renal disease) on dialysis  - Renal following, hemodialysis per schedule   HTN (hypertension)  - Continue with Norvasc and Cozaar.  -resume lower dose coreg, heart rate increasing.   Bradycardia; Asymptomatic. hold  coreg. HR better.  GERD (gastroesophageal reflux disease)  - Continue PPI   History of Bacterial endocarditis  -2-D echo showed no obvious vegetations.   Thrombocytopenia  - Continue SCDs, platelet count improving   Procedures:  Bone scan; osteoporosis.   Consultations:  Dr Venetia Maxon  Discharge Exam: Filed Vitals:   09/09/13 1219  BP: 162/97  Pulse: 111  Temp:   Resp:     General: no distress.  Cardiovascular: S 1, S 2 RRR Respiratory: CTA  Discharge Instructions You were cared for by a hospitalist during your hospital stay. If you have any questions about your discharge medications or the care you received while you were in the hospital after you are discharged, you can call the unit and asked to speak with the hospitalist on call if the hospitalist that took care of you is not available. Once you are discharged, your primary care physician will handle any further medical issues. Please note that NO REFILLS for any discharge medications will be authorized once you are discharged, as it is imperative that you return to your primary care physician (or establish a relationship with a primary care physician if you do not have one) for your aftercare needs so that they can reassess your need for medications and monitor your lab values.  Discharge Instructions   Diet - low sodium heart healthy    Complete by:  As directed      Increase activity slowly    Complete by:  As directed             Medication List    STOP taking these medications       HYDROcodone-acetaminophen 7.5-325 MG per tablet  Commonly known as:  NORCO      TAKE these medications       amLODipine 10 MG tablet  Commonly known as:  NORVASC  Take 10 mg by mouth daily.     bisacodyl 5 MG EC tablet  Commonly known as:  DULCOLAX  Take 5 mg by mouth daily as needed for moderate constipation.     calcitRIOL 0.25 MCG capsule  Commonly known as:  ROCALTROL  Take 3 capsules (0.75 mcg total) by mouth every  Monday, Wednesday, and Friday with hemodialysis.     calcium acetate 667 MG capsule  Commonly known as:  PHOSLO  Take 667 mg by mouth with snacks.     carvedilol 3.125 MG tablet  Commonly known as:  COREG  Take 1 tablet (3.125 mg total) by mouth 2 (two) times daily with a meal.     cloNIDine 0.1 MG tablet  Commonly known as:  CATAPRES  Take 0.1 mg by mouth daily.     diclofenac sodium 1 % Gel  Commonly known as:  VOLTAREN  Apply 2 g topically as needed (for pain).     dicyclomine 20 MG tablet  Commonly known as:  BENTYL  Take 20 mg by mouth every 6 (six) hours.     docusate sodium 100 MG capsule  Commonly known as:  COLACE  Take 100 mg by mouth 2 (two) times daily.  esomeprazole 40 MG capsule  Commonly known as:  NEXIUM  Take 40 mg by mouth daily.     feeding supplement (NEPRO CARB STEADY) Liqd  Take 237 mLs by mouth 2 (two) times daily between meals.     fentaNYL 25 MCG/HR patch  Commonly known as:  DURAGESIC - dosed mcg/hr  Place 25 mcg onto the skin every 3 (three) days.     losartan 50 MG tablet  Commonly known as:  COZAAR  Take 50 mg by mouth at bedtime.     multivitamin Tabs tablet  Take 1 tablet by mouth at bedtime.     oxyCODONE 5 MG immediate release tablet  Commonly known as:  ROXICODONE  Take 1 tablet (5 mg total) by mouth every 4 (four) hours as needed for severe pain.     rOPINIRole 1 MG tablet  Commonly known as:  REQUIP  Take 1 mg by mouth at bedtime.     senna 8.6 MG Tabs tablet  Commonly known as:  SENOKOT  Take 2 tablets by mouth 2 (two) times daily as needed for mild constipation.     SENSIPAR 30 MG tablet  Generic drug:  cinacalcet  Take 30 mg by mouth daily.     zolpidem 5 MG tablet  Commonly known as:  AMBIEN  Take 5 mg by mouth at bedtime as needed for sleep.       Allergies  Allergen Reactions  . Simvastatin Other (See Comments)    Reaction unknown       Follow-up Information   [facility]  [facility].   Specialty:  Family Medicine   Contact information:   [address] [place] [id number] [phone]       Follow up with STERN,JOSEPH D, MD In 1 week.   Specialty:  Neurosurgery   Contact information:   [address] [place] [address] [phone]        The results of significant diagnostics from this hospitalization (including imaging, microbiology, ancillary and laboratory) are listed below for reference.    Significant Diagnostic Studies: Mr Thoracic Spine Wo Contrast  09/02/2013   CLINICAL DATA:  T10-11 discitis. Evaluate for cord compression. No contrast due to diminished renal function.  EXAM: MRI THORACIC SPINE WITHOUT CONTRAST  TECHNIQUE: Multiplanar, multisequence MR imaging of the thoracic spine was performed. No intravenous contrast was administered.  COMPARISON:  CTA chest, abdomen, and pelvis 08/22/2013  FINDINGS: Images are mildly degraded by motion. There is mild exaggeration of the normal thoracic kyphosis. There is mild left convex curvature of the thoracic spine.  As seen on multiple prior CT examinations, there are osseous destructive changes centered about the T10-11 disc space with advanced T10 and T11 vertebral body height loss. There is abnormal fluid signal within the T10-11 disc space and mild T10-11 vertebral marrow edema. Posterior displacement of bone into the spinal canal at T10-11 results in severe spinal stenosis, narrowing the AP diameter of the spinal canal to 6 mm. There is moderate flattening of the spinal cord with mild T2 hyperintensity in the spinal cord at and just below this level. Small ventral epidural fluid collections are questioned posterior to the T10 and T11 vertebral bodies. There is paravertebral soft tissue thickening/edema centered at T10-11. There is severe bilateral neural foraminal stenosis at T10-11 due to vertebral body erosion/collapse, posterior vertebral spurring, and facet arthrosis.  Uncovertebral  hypertrophy at C6-7 results in at least mild left neural foraminal  stenosis, incompletely imaged. Broad-based central disc protrusion at T3-4 does not result in spinal stenosis.  IMPRESSION: Destructive changes centered about the T10-11 disc space consistent with discitis as previously described. Posterior displacement of bone into the spinal canal results in severe spinal stenosis with moderate flattening of the spinal cord and mild spinal cord edema. Small ventral epidural fluid collections/phlegmon at this level with intervertebral edema/phlegmon.   Electronically Signed   By: Sebastian Ache   On: 09/02/2013 21:25   Dg Bone Density  09/08/2013   EXAM: DG DXA BONE DENSITY STUDY  The Bone Mineral Densitometry hard-copy report (which includes all data, graphical display, and FRAX results when applicable) has been sent directly to the ordering physician.  This report can also be obtained electronically by viewing images for this exam through the performing facility's EMR, or by logging directly into [employer].   Electronically Signed   By: Bretta Bang M.D.   On: 09/08/2013 09:54   Dg Abd Acute W/chest  08/22/2013   CLINICAL DATA:  Abdominal pain  EXAM: ACUTE ABDOMEN SERIES (ABDOMEN 2 VIEW & CHEST 1 VIEW)  COMPARISON:  Chest radiograph August 06, 2012; abdominal radiographs August 15, 2013  FINDINGS: PA chest: There is underlying interstitial fibrosis with scattered areas of scarring. There is no frank edema or consolidation. Heart is mildly enlarged with pulmonary vascularity within normal limits. No adenopathy. Aorta is prominent but stable.  Supine and upright abdomen: There is contrast in the colon. Bowel gas pattern is overall unremarkable. No obstruction or free air. There are multiple surgical clips and wires in the lower abdomen and pelvis.  IMPRESSION: Bowel gas pattern unremarkable. Lungs show evidence of interstitial fibrosis and scarring without frank edema or consolidation. Stable cardiac  enlargement.   Electronically Signed   By: Bretta Bang M.D.   On: 08/22/2013 21:07   Ct Angio Chest Aortic Dissect W &/or W/o  08/23/2013   CLINICAL DATA:  Back and abdominal pain  EXAM: CT ANGIOGRAPHY CHEST, ABDOMEN AND PELVIS  TECHNIQUE: Multidetector CT imaging through the chest, abdomen and pelvis was performed using the standard protocol during bolus administration of intravenous contrast. Multiplanar reconstructed images and MIPs were obtained and reviewed to evaluate the vascular anatomy.  CONTRAST:  OMNIPAQUE IOHEXOL 350 MG/ML SOLN, OMNIPAQUE IOHEXOL 350 MG/ML SOLN  COMPARISON:  08/19/2013  FINDINGS: CTA CHEST FINDINGS  THORACIC INLET/BODY WALL:  Anasarca. Markedly enlarged, tortuous, and early filling veins in the left upper extremity related to dialysis fistula or graft. There is venous narrowing at the level of the first rib crossing which could be positional. Left axillary adenopathy which could be congestive. No gross mass lesion in the left breast.  MEDIASTINUM:  Cardiomegaly. There is extensive atherosclerosis in the elongation of the aorta. No intramural hematoma, dissection, or aneurysm.  No adenopathy.  Negative esophagus.  No evidence of central pulmonary embolism. Main pulmonary arteries are enlarged compatible with pulmonary hypertension.  LUNG WINDOWS:  There is a persistent nodular opacity in the lateral segment right middle lobe measuring 15 mm in maximal diameter. Capacity has coal less since 02/03/2013.  OSSEOUS:  No acute fracture.  No suspicious lytic or blastic lesions.  Review of the MIP images confirms the above findings.  CTA ABDOMEN AND PELVIS FINDINGS  BODY WALL: Anasarca.  Liver: No focal abnormality.  Biliary: No evidence of biliary obstruction or stone.  Pancreas: The main pancreatic duct remains chronically dilated up to 4 mm. No gross mass lesion or change from  05/07/2013.  Spleen: Unremarkable.  Adrenals: Unremarkable.  Kidneys and ureters: Atrophic and  multi cystic kidneys compatible with dialysis related polycystic kidney disease.  Bladder: Decompressed.  Reproductive: Hysterectomy.  Bowel: Distal colonic diverticulosis. No bowel obstruction.  Retroperitoneum: No mass or adenopathy.  Peritoneum: No ascites or pneumoperitoneum.  Vascular: No aortic aneurysm or dissection. No major vessel stenosis or occlusion.  OSSEOUS: There is renal osteodystrophy with diffuse trabecular coarsening, subchondral erosions, and osteopenia. No change in extensive bone loss, compression deformity, and subchondral erosions at centered at the T10-11 disc. Osteophyte formation and anterolisthesis causes advanced canal stenosis at this level. Erosive changes to both facet joints at this level could be inflammatory or from abnormal motion. This could represent dialysis related spondyloarthropathy or infectious discitis/osteomyelitis. Noted that the patient has undergone empiric antibiotic treatment. Stable subchondral erosive changes at L4-5.  Review of the MIP images confirms the above findings.  IMPRESSION: 1. Negative for aortic dissection. 2. Unchanged appearance of discitis at T10-11. Bone destruction and probable instability causes advanced spinal canal stenosis at this level. 3. 15 mm nodule in the right middle lobe which his gradually increased in size/density. Neoplastic nodule is a diagnostic possibility, consider six-month follow-up. 4. Multiple chronic/incidental findings are stable from prior and noted above.   Electronically Signed   By: Tiburcio Pea M.D.   On: 08/23/2013 00:23   Ct Cta Abd/pel W/cm &/or W/o Cm  08/23/2013   CLINICAL DATA:  Back and abdominal pain  EXAM: CT ANGIOGRAPHY CHEST, ABDOMEN AND PELVIS  TECHNIQUE: Multidetector CT imaging through the chest, abdomen and pelvis was performed using the standard protocol during bolus administration of intravenous contrast. Multiplanar reconstructed images and MIPs were obtained and reviewed to evaluate the vascular  anatomy.  CONTRAST:  OMNIPAQUE IOHEXOL 350 MG/ML SOLN, OMNIPAQUE IOHEXOL 350 MG/ML SOLN  COMPARISON:  08/19/2013  FINDINGS: CTA CHEST FINDINGS  THORACIC INLET/BODY WALL:  Anasarca. Markedly enlarged, tortuous, and early filling veins in the left upper extremity related to dialysis fistula or graft. There is venous narrowing at the level of the first rib crossing which could be positional. Left axillary adenopathy which could be congestive. No gross mass lesion in the left breast.  MEDIASTINUM:  Cardiomegaly. There is extensive atherosclerosis in the elongation of the aorta. No intramural hematoma, dissection, or aneurysm.  No adenopathy.  Negative esophagus.  No evidence of central pulmonary embolism. Main pulmonary arteries are enlarged compatible with pulmonary hypertension.  LUNG WINDOWS:  There is a persistent nodular opacity in the lateral segment right middle lobe measuring 15 mm in maximal diameter. Capacity has coal less since 02/03/2013.  OSSEOUS:  No acute fracture.  No suspicious lytic or blastic lesions.  Review of the MIP images confirms the above findings.  CTA ABDOMEN AND PELVIS FINDINGS  BODY WALL: Anasarca.  Liver: No focal abnormality.  Biliary: No evidence of biliary obstruction or stone.  Pancreas: The main pancreatic duct remains chronically dilated up to 4 mm. No gross mass lesion or change from 05/07/2013.  Spleen: Unremarkable.  Adrenals: Unremarkable.  Kidneys and ureters: Atrophic and multi cystic kidneys compatible with dialysis related polycystic kidney disease.  Bladder: Decompressed.  Reproductive: Hysterectomy.  Bowel: Distal colonic diverticulosis. No bowel obstruction.  Retroperitoneum: No mass or adenopathy.  Peritoneum: No ascites or pneumoperitoneum.  Vascular: No aortic aneurysm or dissection. No major vessel stenosis or occlusion.  OSSEOUS: There is renal osteodystrophy with diffuse trabecular coarsening, subchondral erosions, and osteopenia. No change in extensive  bone loss, compression  deformity, and subchondral erosions at centered at the T10-11 disc. Osteophyte formation and anterolisthesis causes advanced canal stenosis at this level. Erosive changes to both facet joints at this level could be inflammatory or from abnormal motion. This could represent dialysis related spondyloarthropathy or infectious discitis/osteomyelitis. Noted that the patient has undergone empiric antibiotic treatment. Stable subchondral erosive changes at L4-5.  Review of the MIP images confirms the above findings.  IMPRESSION: 1. Negative for aortic dissection. 2. Unchanged appearance of discitis at T10-11. Bone destruction and probable instability causes advanced spinal canal stenosis at this level. 3. 15 mm nodule in the right middle lobe which his gradually increased in size/density. Neoplastic nodule is a diagnostic possibility, consider six-month follow-up. 4. Multiple chronic/incidental findings are stable from prior and noted above.   Electronically Signed   By: Tiburcio Pea M.D.   On: 08/23/2013 00:23    Microbiology: Recent Results (from the past 240 hour(s))  CULTURE, BLOOD (ROUTINE X 2)     Status: None   Collection Time    09/03/13  1:10 PM      Result Value Ref Range Status   Specimen Description BLOOD RIGHT HAND   Final   Special Requests BOTTLES DRAWN AEROBIC ONLY 1CC   Final   Culture  Setup Time     Final   Value: 09/03/2013 18:00     Performed at [facility]   Culture     Final   Value: NO GROWTH 5 DAYS     Performed at [facility]   Report Status 09/09/2013 FINAL   Final  CULTURE, BLOOD (ROUTINE X 2)     Status: None   Collection Time    09/04/13  1:05 PM      Result Value Ref Range Status   Specimen Description BLOOD RIGHT HAND   Final   Special Requests BOTTLES DRAWN AEROBIC ONLY 10CC   Final   Culture  Setup Time     Final   Value: 09/04/2013 19:00     Performed at [facility]   Culture     Final   Value:        BLOOD  CULTURE RECEIVED NO GROWTH TO DATE CULTURE WILL BE HELD FOR 5 DAYS BEFORE ISSUING A FINAL NEGATIVE REPORT     Performed at [facility]   Report Status PENDING   Incomplete     Labs: Basic Metabolic Panel:  Recent Labs Lab 09/02/13 2139 09/03/13 1648 09/05/13 0727 09/07/13 0658 09/09/13 0651  NA 133* 136* 136* 139 135*  K 5.5* 4.4 5.5* 4.5 4.1  CL 94* 97 96 96 94*  CO2 19 25 25 25 22   GLUCOSE 85 96 106* 111* 125*  BUN 30* 26* 42* 58* 70*  CREATININE 4.85* 3.75* 5.26* 5.07* 5.53*  CALCIUM 9.2 8.3* 8.4 8.8 8.6  PHOS  --  4.0 6.1* 4.4 4.2   Liver Function Tests:  Recent Labs Lab 09/03/13 1648 09/05/13 0727 09/07/13 0658 09/09/13 0651  ALBUMIN 3.5 3.4* 3.2* 3.2*   No results found for this basename: LIPASE, AMYLASE,  in the last 168 hours No results found for this basename: AMMONIA,  in the last 168 hours CBC:  Recent Labs Lab 09/02/13 2139 09/03/13 1648 09/05/13 0727 09/07/13 0658 09/09/13 0651  WBC 4.2 5.1 3.4* 6.0 7.6  HGB 11.4* 10.4* 10.5* 10.7* 11.5*  HCT 36.4 32.2* 33.5* 34.1* 35.7*  MCV 91.2 88.7 90.8 92.9 92.2  PLT 71* 118* 137* 123* 135*  Cardiac Enzymes: No results found for this basename: CKTOTAL, CKMB, CKMBINDEX, TROPONINI,  in the last 168 hours BNP: BNP (last 3 results) No results found for this basename: PROBNP,  in the last 8760 hours CBG: No results found for this basename: GLUCAP,  in the last 168 hours     Signed:  Hartley Barefoot A  [facility] 09/09/2013, 12:59 PM

## 2013-09-10 LAB — CULTURE, BLOOD (ROUTINE X 2): CULTURE: NO GROWTH

## 2013-09-10 NOTE — Progress Notes (Signed)
09/09/13 20:55 Patient discharged to home via transport.Home medications stored in main pharmacy were returned to patient.Epifania Gore. Tauren Delbuono, Drinda Buttsharito Joselita, RN

## 2013-11-21 ENCOUNTER — Encounter (HOSPITAL_COMMUNITY): Payer: Self-pay | Admitting: Nephrology

## 2013-12-10 ENCOUNTER — Inpatient Hospital Stay (HOSPITAL_COMMUNITY)
Admission: AD | Admit: 2013-12-10 | Discharge: 2013-12-16 | DRG: 682 | Disposition: A | Discharge status: Skilled Nursing Facility | Payer: Medicare Other | Source: Other Acute Inpatient Hospital | Attending: Internal Medicine | Admitting: Internal Medicine

## 2013-12-10 ENCOUNTER — Encounter (HOSPITAL_COMMUNITY): Payer: Self-pay

## 2013-12-10 DIAGNOSIS — I69354 Hemiplegia and hemiparesis following cerebral infarction affecting left non-dominant side: Secondary | ICD-10-CM

## 2013-12-10 DIAGNOSIS — M479 Spondylosis, unspecified: Secondary | ICD-10-CM | POA: Diagnosis present

## 2013-12-10 DIAGNOSIS — I120 Hypertensive chronic kidney disease with stage 5 chronic kidney disease or end stage renal disease: Secondary | ICD-10-CM | POA: Diagnosis present

## 2013-12-10 DIAGNOSIS — M4804 Spinal stenosis, thoracic region: Secondary | ICD-10-CM | POA: Diagnosis present

## 2013-12-10 DIAGNOSIS — R51 Headache: Secondary | ICD-10-CM | POA: Diagnosis present

## 2013-12-10 DIAGNOSIS — K219 Gastro-esophageal reflux disease without esophagitis: Secondary | ICD-10-CM | POA: Diagnosis present

## 2013-12-10 DIAGNOSIS — N2581 Secondary hyperparathyroidism of renal origin: Secondary | ICD-10-CM | POA: Diagnosis present

## 2013-12-10 DIAGNOSIS — D631 Anemia in chronic kidney disease: Secondary | ICD-10-CM | POA: Diagnosis present

## 2013-12-10 DIAGNOSIS — G8929 Other chronic pain: Secondary | ICD-10-CM | POA: Diagnosis present

## 2013-12-10 DIAGNOSIS — Z9981 Dependence on supplemental oxygen: Secondary | ICD-10-CM

## 2013-12-10 DIAGNOSIS — N186 End stage renal disease: Secondary | ICD-10-CM | POA: Diagnosis present

## 2013-12-10 DIAGNOSIS — Z87891 Personal history of nicotine dependence: Secondary | ICD-10-CM | POA: Diagnosis not present

## 2013-12-10 DIAGNOSIS — B1920 Unspecified viral hepatitis C without hepatic coma: Secondary | ICD-10-CM | POA: Diagnosis present

## 2013-12-10 DIAGNOSIS — M170 Bilateral primary osteoarthritis of knee: Secondary | ICD-10-CM | POA: Diagnosis present

## 2013-12-10 DIAGNOSIS — D696 Thrombocytopenia, unspecified: Secondary | ICD-10-CM | POA: Diagnosis present

## 2013-12-10 DIAGNOSIS — I10 Essential (primary) hypertension: Secondary | ICD-10-CM | POA: Diagnosis present

## 2013-12-10 DIAGNOSIS — R1013 Epigastric pain: Secondary | ICD-10-CM

## 2013-12-10 DIAGNOSIS — M5414 Radiculopathy, thoracic region: Secondary | ICD-10-CM | POA: Diagnosis present

## 2013-12-10 DIAGNOSIS — M549 Dorsalgia, unspecified: Secondary | ICD-10-CM | POA: Diagnosis present

## 2013-12-10 DIAGNOSIS — M545 Low back pain, unspecified: Secondary | ICD-10-CM

## 2013-12-10 DIAGNOSIS — F419 Anxiety disorder, unspecified: Secondary | ICD-10-CM | POA: Diagnosis present

## 2013-12-10 DIAGNOSIS — R109 Unspecified abdominal pain: Secondary | ICD-10-CM | POA: Diagnosis present

## 2013-12-10 DIAGNOSIS — Z90711 Acquired absence of uterus with remaining cervical stump: Secondary | ICD-10-CM | POA: Diagnosis present

## 2013-12-10 DIAGNOSIS — N189 Chronic kidney disease, unspecified: Secondary | ICD-10-CM

## 2013-12-10 DIAGNOSIS — Z888 Allergy status to other drugs, medicaments and biological substances status: Secondary | ICD-10-CM | POA: Diagnosis not present

## 2013-12-10 DIAGNOSIS — D649 Anemia, unspecified: Secondary | ICD-10-CM | POA: Diagnosis present

## 2013-12-10 DIAGNOSIS — F329 Major depressive disorder, single episode, unspecified: Secondary | ICD-10-CM | POA: Diagnosis present

## 2013-12-10 DIAGNOSIS — G5600 Carpal tunnel syndrome, unspecified upper limb: Secondary | ICD-10-CM | POA: Diagnosis present

## 2013-12-10 DIAGNOSIS — Z992 Dependence on renal dialysis: Secondary | ICD-10-CM

## 2013-12-10 DIAGNOSIS — G543 Thoracic root disorders, not elsewhere classified: Secondary | ICD-10-CM | POA: Diagnosis present

## 2013-12-10 DIAGNOSIS — D72819 Decreased white blood cell count, unspecified: Secondary | ICD-10-CM | POA: Diagnosis present

## 2013-12-10 DIAGNOSIS — R531 Weakness: Secondary | ICD-10-CM

## 2013-12-10 DIAGNOSIS — Z79899 Other long term (current) drug therapy: Secondary | ICD-10-CM

## 2013-12-10 DIAGNOSIS — E43 Unspecified severe protein-calorie malnutrition: Secondary | ICD-10-CM | POA: Insufficient documentation

## 2013-12-10 DIAGNOSIS — R5381 Other malaise: Secondary | ICD-10-CM | POA: Diagnosis present

## 2013-12-10 DIAGNOSIS — D61818 Other pancytopenia: Secondary | ICD-10-CM

## 2013-12-10 LAB — GLUCOSE, CAPILLARY: GLUCOSE-CAPILLARY: 89 mg/dL (ref 70–99)

## 2013-12-10 LAB — MRSA PCR SCREENING: MRSA by PCR: NEGATIVE

## 2013-12-10 MED ORDER — DARBEPOETIN ALFA 150 MCG/0.3ML IJ SOSY: 150.0000 ug | PREFILLED_SYRINGE | INTRAMUSCULAR | Status: DC

## 2013-12-10 MED ORDER — PANTOPRAZOLE SODIUM 40 MG PO TBEC
40.0000 mg | DELAYED_RELEASE_TABLET | Freq: Every day | ORAL | Status: DC
  Administered 2013-12-10 – 2013-12-16 (×7): 40 mg via ORAL
  Filled 2013-12-10 (×5): qty 1

## 2013-12-10 MED ORDER — ONDANSETRON HCL 4 MG/2ML IJ SOLN: 4.0000 mg | Freq: Four times a day (QID) | INTRAMUSCULAR | Status: DC | PRN

## 2013-12-10 MED ORDER — MORPHINE SULFATE 2 MG/ML IJ SOLN
2.0000 mg | INTRAMUSCULAR | Status: DC | PRN
  Administered 2013-12-10 – 2013-12-13 (×3): 2 mg via INTRAVENOUS

## 2013-12-10 MED ORDER — ALUM & MAG HYDROXIDE-SIMETH 200-200-20 MG/5ML PO SUSP: 30.0000 mL | Freq: Four times a day (QID) | ORAL | Status: DC | PRN

## 2013-12-10 MED ORDER — ROPINIROLE HCL 1 MG PO TABS
1.0000 mg | ORAL_TABLET | Freq: Every day | ORAL | Status: DC
  Filled 2013-12-10: qty 1

## 2013-12-10 MED ORDER — ACETAMINOPHEN 650 MG RE SUPP: 650.0000 mg | Freq: Four times a day (QID) | RECTAL | Status: DC | PRN

## 2013-12-10 MED ORDER — CLONIDINE HCL 0.1 MG PO TABS
0.1000 mg | ORAL_TABLET | Freq: Every day | ORAL | Status: DC
  Administered 2013-12-10: 0.1 mg via ORAL
  Filled 2013-12-10 (×2): qty 1

## 2013-12-10 MED ORDER — ROPINIROLE HCL 1 MG PO TABS
1.0000 mg | ORAL_TABLET | Freq: Every day | ORAL | Status: DC
  Administered 2013-12-10 – 2013-12-15 (×6): 1 mg via ORAL
  Filled 2013-12-10 (×7): qty 1

## 2013-12-10 MED ORDER — MORPHINE SULFATE 2 MG/ML IJ SOLN
INTRAMUSCULAR | Status: AC
  Filled 2013-12-10: qty 1

## 2013-12-10 MED ORDER — SODIUM CHLORIDE 0.9 % IJ SOLN
3.0000 mL | Freq: Two times a day (BID) | INTRAMUSCULAR | Status: DC
  Administered 2013-12-12: 3 mL via INTRAVENOUS

## 2013-12-10 MED ORDER — OXYCODONE HCL 5 MG PO TABS
5.0000 mg | ORAL_TABLET | ORAL | Status: DC | PRN
  Administered 2013-12-10 – 2013-12-16 (×9): 5 mg via ORAL
  Filled 2013-12-10 (×10): qty 1

## 2013-12-10 MED ORDER — LIDOCAINE HCL (PF) 1 % IJ SOLN: 5.0000 mL | INTRAMUSCULAR | Status: DC | PRN

## 2013-12-10 MED ORDER — HEPARIN SODIUM (PORCINE) 5000 UNIT/ML IJ SOLN
5000.0000 [IU] | Freq: Three times a day (TID) | INTRAMUSCULAR | Status: DC
  Filled 2013-12-10 (×4): qty 1

## 2013-12-10 MED ORDER — ALTEPLASE 2 MG IJ SOLR
2.0000 mg | Freq: Once | INTRAMUSCULAR | Status: DC | PRN
  Filled 2013-12-10: qty 2

## 2013-12-10 MED ORDER — AMLODIPINE BESYLATE 10 MG PO TABS
10.0000 mg | ORAL_TABLET | Freq: Every day | ORAL | Status: DC
  Administered 2013-12-10: 10 mg via ORAL
  Filled 2013-12-10 (×2): qty 1

## 2013-12-10 MED ORDER — SODIUM CHLORIDE 0.9 % IV SOLN: 250.0000 mL | INTRAVENOUS | Status: DC | PRN

## 2013-12-10 MED ORDER — CARVEDILOL 3.125 MG PO TABS
3.1250 mg | ORAL_TABLET | Freq: Two times a day (BID) | ORAL | Status: DC
  Administered 2013-12-10: 3.125 mg via ORAL
  Filled 2013-12-10 (×4): qty 1

## 2013-12-10 MED ORDER — SENNA 8.6 MG PO TABS
2.0000 | ORAL_TABLET | Freq: Two times a day (BID) | ORAL | Status: DC | PRN
  Filled 2013-12-10: qty 2

## 2013-12-10 MED ORDER — HEPARIN SODIUM (PORCINE) 1000 UNIT/ML DIALYSIS: 1000.0000 [IU] | INTRAMUSCULAR | Status: DC | PRN

## 2013-12-10 MED ORDER — RENA-VITE PO TABS
1.0000 | ORAL_TABLET | Freq: Every day | ORAL | Status: DC
  Administered 2013-12-11 – 2013-12-15 (×5): 1 via ORAL
  Filled 2013-12-10 (×8): qty 1

## 2013-12-10 MED ORDER — ZOLPIDEM TARTRATE 5 MG PO TABS
5.0000 mg | ORAL_TABLET | Freq: Every evening | ORAL | Status: DC | PRN
  Administered 2013-12-10 – 2013-12-15 (×4): 5 mg via ORAL
  Filled 2013-12-10 (×5): qty 1

## 2013-12-10 MED ORDER — DARBEPOETIN ALFA 100 MCG/0.5ML IJ SOSY
PREFILLED_SYRINGE | INTRAMUSCULAR | Status: AC
  Administered 2013-12-10: 100 ug via INTRAVENOUS
  Filled 2013-12-10: qty 0.5

## 2013-12-10 MED ORDER — ACETAMINOPHEN 325 MG PO TABS: 650.0000 mg | ORAL_TABLET | Freq: Four times a day (QID) | ORAL | Status: DC | PRN

## 2013-12-10 MED ORDER — NEPRO/CARBSTEADY PO LIQD
237.0000 mL | Freq: Two times a day (BID) | ORAL | Status: DC
  Administered 2013-12-10 – 2013-12-16 (×11): 237 mL via ORAL
  Filled 2013-12-10 (×7): qty 237

## 2013-12-10 MED ORDER — BISACODYL 5 MG PO TBEC: 5.0000 mg | DELAYED_RELEASE_TABLET | Freq: Every day | ORAL | Status: DC | PRN

## 2013-12-10 MED ORDER — PENTAFLUOROPROP-TETRAFLUOROETH EX AERO: 1.0000 "application " | INHALATION_SPRAY | CUTANEOUS | Status: DC | PRN

## 2013-12-10 MED ORDER — CALCIUM ACETATE 667 MG PO CAPS
667.0000 mg | ORAL_CAPSULE | ORAL | Status: DC
  Administered 2013-12-11: 667 mg via ORAL
  Filled 2013-12-10 (×4): qty 1

## 2013-12-10 MED ORDER — DOCUSATE SODIUM 100 MG PO CAPS
100.0000 mg | ORAL_CAPSULE | Freq: Two times a day (BID) | ORAL | Status: DC
  Administered 2013-12-10 – 2013-12-15 (×9): 100 mg via ORAL
  Filled 2013-12-10 (×13): qty 1

## 2013-12-10 MED ORDER — DARBEPOETIN ALFA 100 MCG/0.5ML IJ SOSY
100.0000 ug | PREFILLED_SYRINGE | Freq: Once | INTRAMUSCULAR | Status: AC
Start: 2013-12-10 — End: 2013-12-10
  Administered 2013-12-10 (×2): 100 ug via INTRAVENOUS
  Filled 2013-12-10: qty 0.5

## 2013-12-10 MED ORDER — SODIUM CHLORIDE 0.9 % IV SOLN: 100.0000 mL | INTRAVENOUS | Status: DC | PRN

## 2013-12-10 MED ORDER — FENTANYL 25 MCG/HR TD PT72
25.0000 ug | MEDICATED_PATCH | TRANSDERMAL | Status: DC
  Administered 2013-12-10 – 2013-12-16 (×3): 25 ug via TRANSDERMAL
  Filled 2013-12-10 (×3): qty 1

## 2013-12-10 MED ORDER — LOSARTAN POTASSIUM 50 MG PO TABS
50.0000 mg | ORAL_TABLET | Freq: Every day | ORAL | Status: DC
  Administered 2013-12-10: 50 mg via ORAL
  Filled 2013-12-10 (×2): qty 1

## 2013-12-10 MED ORDER — SODIUM CHLORIDE 0.9 % IJ SOLN: 3.0000 mL | INTRAMUSCULAR | Status: DC | PRN

## 2013-12-10 MED ORDER — CINACALCET HCL 30 MG PO TABS
30.0000 mg | ORAL_TABLET | Freq: Every day | ORAL | Status: DC
  Administered 2013-12-11 – 2013-12-15 (×5): 30 mg via ORAL
  Filled 2013-12-10 (×7): qty 1

## 2013-12-10 MED ORDER — DICYCLOMINE HCL 20 MG PO TABS
20.0000 mg | ORAL_TABLET | Freq: Four times a day (QID) | ORAL | Status: DC
  Administered 2013-12-10 – 2013-12-16 (×21): 20 mg via ORAL
  Filled 2013-12-10 (×30): qty 1

## 2013-12-10 MED ORDER — LIDOCAINE-PRILOCAINE 2.5-2.5 % EX CREA
1.0000 "application " | TOPICAL_CREAM | CUTANEOUS | Status: DC | PRN
  Filled 2013-12-10: qty 5

## 2013-12-10 MED ORDER — SODIUM CHLORIDE 0.9 % IJ SOLN
3.0000 mL | Freq: Two times a day (BID) | INTRAMUSCULAR | Status: DC
  Administered 2013-12-10 – 2013-12-11 (×4): 3 mL via INTRAVENOUS

## 2013-12-10 MED ORDER — CALCITRIOL 0.5 MCG PO CAPS
0.7500 ug | ORAL_CAPSULE | ORAL | Status: DC
  Administered 2013-12-12 – 2013-12-14 (×2): 0.75 ug via ORAL
  Filled 2013-12-10 (×3): qty 1

## 2013-12-10 MED ORDER — NEPRO/CARBSTEADY PO LIQD
237.0000 mL | ORAL | Status: DC | PRN
  Filled 2013-12-10: qty 237

## 2013-12-10 MED ORDER — ONDANSETRON HCL 4 MG PO TABS: 4.0000 mg | ORAL_TABLET | Freq: Four times a day (QID) | ORAL | Status: DC | PRN

## 2013-12-10 NOTE — Consult Note (Signed)
Renal Service Consult Note [facility]  Julia Small 12/10/2013 Maree Krabbe Requesting Physician: Dr Vanessa Ahtziry  Reason for Consult:  ESRD pt with acute on chronic anemia HPI: The patient is a 78 y.o. year-old with hx of ESRD, HTN, spinal stenosis presented to [facility] with fatigue and found to have Hb of 6.4.  Also SOB, DOE, too weak to go to HD yesterday.  Ongoing abd pain at home which on recent admit was felt to be thoracic radiculopathy related to bony destruction in the thoracic spine (see below).  Pt sent to [facility] for direct admit.  She refuses blood products.  No hematemesis, or bloody stool.   Denies SOB, CP, dark or black stools.  No nsaids.    Looking back , the Hb this past summer was well controlled on low dose ESA w HB around 10-12.  In October Hb started to fall into the 9-10 range, and in the last month has been in 8-9 range.  Aranesp was increased to 50 ug and IV iron ordered for low tsat recently.    Chart review: Chart Review: 07/2012 - back pain, gen weakness, nausea, chills > admitted, was afebrile, WBC 3.5. CT abd showed acute discitis/osteomyelitis at T10-11, also suggestion of signs of prior diskitis/osteo at T9-10 and at L4-5. IR did disc aspiration of 1 ml bloody fluid from T10-11. Blood cultures were drawn before abx were started with vanc and cefepime. ECHO showed heavy aortic valve calcification, "could represent vegetation". There was no valvular insufficiency. Pts pain and other symptoms improved on ABx and was dc'd after 2-3 days of IV abx to receive 6 weeks total. Also ESRD on HD, hx CVA, depression 8/14 - 09/09/13 > abd pain for 4 months , extensive OP w/u negative, in ED prev week and CT angio of abd was negative, dc'd home on oxycodone; MRI showed discitis of thoracic spine with severe spinal stenosis / mild cord edema. Hx of discitis in same region one year prior. Seen by NSurg , felt pain did result from the kyphosis and collapse at  T10/11 with bilat thoracic radiculopathies. MRI showed kyphosis and canal stenosis at T10/11 with cord and nerve root compression.  NSurg did bone density and bones were weak so surgery ultimately was decided against, combination of high risks and pt reluctance to have surgery.  Treated with pain meds and a brace.  IR was not able to do disc aspiration due to sig bone destruction in that area. ID recommended no abx as infection not likely to be active; spinal findings were felt to be chronic.      ROS  no rash  no jt pain  no HA  no blurred vision  Past Medical History  Past Medical History  Diagnosis Date  . Hypertension   . GERD (gastroesophageal reflux disease)   . Carpal tunnel syndrome   . Secondary hyperparathyroidism (of renal origin)   . Anginal pain   . Pneumonia   . On home oxygen therapy     "2L prn" (09/02/2013)  . H/O hiatal hernia   . Anemia   . Hepatitis C     "I'm suppose to be clear"  . Headache(784.0)     "when I'm in pain" (09/02/2013)  . CVA (cerebral vascular accident)     residual left sided weakness  . Osteoarthritis     "back, knees, hand" (09/02/2013)  . Anxiety   . Depression   . ESRD (end stage renal disease) on dialysis 08/06/2012  [place] 2000 in [place]. [place] [place] and got HD in [place], [place] for about a year then moved to [place] and has been getting HD there since. She is on MWF schedule. ESRD was caused by HTN.      Past Surgical History  Past Surgical History  Procedure Laterality Date  . Ankle reconstruction Left     "got 5 screws and 2 bolts in there"  . Cesarean section  X 3  . Sp av dialysis shunt access existing *l* Left   . Abdominal hysterectomy    . Cataract extraction, bilateral Bilateral   . Cardiac catheterization     Family History History reviewed. No pertinent family history. Social History  reports that she has quit smoking. Her smoking use included Cigarettes. She has a 10 pack-year  smoking history. She has never used smokeless tobacco. She reports that she drinks alcohol. She reports that she does not use illicit drugs. Allergies No Active Allergies Home medications Prior to Admission medications   Medication Sig Start Date End Date Taking? Authorizing Provider  amLODipine (NORVASC) 10 MG tablet Take 10 mg by mouth daily. 08/16/13   Historical Provider, MD  bisacodyl (DULCOLAX) 5 MG EC tablet Take 5 mg by mouth daily as needed for moderate constipation.    Historical Provider, MD  calcitRIOL (ROCALTROL) 0.25 MCG capsule Take 3 capsules (0.75 mcg total) by mouth every Monday, Wednesday, and Friday with hemodialysis. 09/09/13   Belkys A Regalado, MD  calcium acetate (PHOSLO) 667 MG capsule Take 667 mg by mouth with snacks.    Historical Provider, MD  carvedilol (COREG) 3.125 MG tablet Take 1 tablet (3.125 mg total) by mouth 2 (two) times daily with a meal. 09/09/13   Belkys A Regalado, MD  cloNIDine (CATAPRES) 0.1 MG tablet Take 0.1 mg by mouth daily.  12/20/12   Historical Provider, MD  dexamethasone (DECADRON) 4 MG tablet Take 1 tablet with meals twice a day for 3 days then 1 tablet daily with meals for 2 days then stop. 09/09/13   Belkys A Regalado, MD  diclofenac sodium (VOLTAREN) 1 % GEL Apply 2 g topically as needed (for pain). 09/09/13   Belkys A Regalado, MD  dicyclomine (BENTYL) 20 MG tablet Take 20 mg by mouth every 6 (six) hours.    Historical Provider, MD  docusate sodium (COLACE) 100 MG capsule Take 100 mg by mouth 2 (two) times daily.    Historical Provider, MD  esomeprazole (NEXIUM) 40 MG capsule Take 40 mg by mouth daily.    Historical Provider, MD  fentaNYL (DURAGESIC - DOSED MCG/HR) 25 MCG/HR patch Place 25 mcg onto the skin every 3 (three) days.  02/08/13   Historical Provider, MD  losartan (COZAAR) 50 MG tablet Take 50 mg by mouth at bedtime.     Historical Provider, MD  multivitamin (RENA-VIT) TABS tablet Take 1 tablet by mouth at bedtime.    Historical Provider,  MD  Nutritional Supplements (FEEDING SUPPLEMENT, NEPRO CARB STEADY,) LIQD Take 237 mLs by mouth 2 (two) times daily between meals. 09/09/13   Belkys A Regalado, MD  oxyCODONE (ROXICODONE) 5 MG immediate release tablet Take 1 tablet (5 mg total) by mouth every 4 (four) hours as needed for severe pain. 09/09/13   Belkys A Regalado, MD  rOPINIRole (REQUIP) 1 MG tablet Take 1 mg by mouth at bedtime.    Historical Provider, MD  senna (SENOKOT) 8.6 MG TABS tablet Take 2 tablets by mouth  2 (two) times daily as needed for mild constipation.     Historical Provider, MD  SENSIPAR 30 MG tablet Take 30 mg by mouth daily. 08/11/13   Historical Provider, MD  zolpidem (AMBIEN) 5 MG tablet Take 5 mg by mouth at bedtime as needed for sleep.    Historical Provider, MD   Liver Function Tests No results for input(s): AST, ALT, ALKPHOS, BILITOT, PROT, ALBUMIN in the last 168 hours. No results for input(s): LIPASE, AMYLASE in the last 168 hours. CBC No results for input(s): WBC, NEUTROABS, HGB, HCT, MCV, PLT in the last 168 hours. Basic Metabolic Panel No results for input(s): NA, K, CL, CO2, GLUCOSE, BUN, CREATININE, CALCIUM, PHOS in the last 168 hours.  Filed Vitals:   12/10/13 1335  BP: 126/73  Pulse: 72  Resp: 17  SpO2: 100%   Exam Frail elderly AAF , not in distress, on HD No rash, cyanosis or gangrene Sclera anicteric, throat clear No jvd Chest is clear bilat RRR no MRG Abd soft, no ascites , NTND No LE edema Neuro bilat LE weakness, chronic issue, Ox 3  HD: MWF [facility] 4h   400/600   51kg   2/2.0 Bath  LUA AVF  Heparin none    Hect 4 ug TIW, Aranesp 50 ug / wk, Venofer 100 mg / hd x 5 thru 11/25    Assessment: 1. Anemia acute on chronic - due to CKD, r/o GIB, will increase ESA dose 2. ESRD on HD 3. HTN on losartan, amlod, coreg; BP stable, +3kg 4. Debility/ chronic thoracic bony destruction / chronic back and related radicular abd pain 5. Sec HPTH cont sensipar, binders, vit D; labs  pending 6. COPD on home O2   Plan- HD tonight, cont BP meds as at home, increase Aranesp to 150 ug wk, check Fe/tibc  Vinson Moselleob Toniann Dickerson MD (pgr) [phone]    ([phone]) [phone] 12/10/2013, 4:47 PM

## 2013-12-10 NOTE — Procedures (Signed)
I was present at this dialysis session, have reviewed the session itself and made  appropriate changes  Vinson Moselleob Farrin Shadle MD (pgr) [phone]    ([phone]) [phone] 12/10/2013, 8:06 PM

## 2013-12-10 NOTE — H&P (Signed)
[facility] History and Physical  Julia Small ZOX:[id number] DOB: 1935-03-13 DOA: 12/10/2013  Referring physician:  PCP: Lise Auer, MD   Chief Complaint: Generalized weakness  HPI: Julia Small is a 78 y.o. female with a past medical history of end-stage renal disease undergoing hemodialysis on Mondays Wednesdays Fridays, history of chronic anemia, hypertension, presenting as a transfer from [facility]. She originally presented to [place] with complaints of generalized weakness with associated shortness of breath, progressively worsening over the past several days. She reported feeling too weak to even undergo hemodialysis yesterday for which she skipped this session. She has a complaints of ongoing abdominal pain which she confirms is chronic. Presently resides home and had previously cataract family members to assist with her care, however, patient reporting that family members had encouraged her to explore the possibility of SNF. Lab work performed at [facility] showed a hemoglobin of 6.4, hematocrit of 20.4. She was found to have a creatinine of 4.5, BUN 35, potassium 3.6 and bicarbonate 33. Patient reporting being a Jehovah Witness and does not want to receive blood products. She denies hematemesis, bloody stools, bright red blood per rectum.   Review of Systems:  Constitutional:  No weight loss, night sweats, Fevers, chills, positive for fatigue, generalized weakness, poor tolerance to physical exertion.  HEENT:  No headaches, Difficulty swallowing,Tooth/dental problems,Sore throat,  No sneezing, itching, ear ache, nasal congestion, post nasal drip,  Cardio-vascular:  No chest pain, Orthopnea, PND, swelling in lower extremities, anasarca, positive for dizziness, palpitations  GI:  No heartburn, indigestion, abdominal pain, nausea, vomiting, diarrhea, change in bowel habits, loss of appetite  Resp:  Positive for shortness of breath with exertion or at  rest. No excess mucus, no productive cough, No non-productive cough, No coughing up of blood.No change in color of mucus.No wheezing.No chest wall deformity  Skin:  no rash or lesions.  GU:  no dysuria, change in color of urine, no urgency or frequency. No flank pain.  Musculoskeletal:  Positive for joint pain involving multiple bilateral joints. No decreased range of motion. Positive for back pain.  Psych:  No change in mood or affect. No depression or anxiety. No memory loss.   Past Medical History  Diagnosis Date  . Hypertension   . GERD (gastroesophageal reflux disease)   . Carpal tunnel syndrome   . Secondary hyperparathyroidism (of renal origin)   . Anginal pain   . Pneumonia   . On home oxygen therapy     "2L prn" (09/02/2013)  . H/O hiatal hernia   . Anemia   . Hepatitis C     "I'm suppose to be clear"  . Headache(784.0)     "when I'm in pain" (09/02/2013)  . CVA (cerebral vascular accident)     residual left sided weakness  . Osteoarthritis     "back, knees, hand" (09/02/2013)  . Anxiety   . Depression   . ESRD (end stage renal disease) on dialysis 08/06/2012    Started HD in 2000 in [place], [place]. She moved to [facility] in 2009 and got HD in [place], [place] for about a year then moved to [place] and has been getting HD there since. She is on MWF schedule. ESRD was caused by HTN.      Past Surgical History  Procedure Laterality Date  . Ankle reconstruction Left     "got 5 screws and 2 bolts in there"  . Cesarean section  X 3  . Sp av dialysis shunt access existing *  l* Left   . Abdominal hysterectomy    . Cataract extraction, bilateral Bilateral   . Cardiac catheterization     Social History:  reports that she has quit smoking. Her smoking use included Cigarettes. She has a 10 pack-year smoking history. She has never used smokeless tobacco. She reports that she drinks alcohol. She reports that she does not use illicit drugs.  Allergies  Allergen Reactions  .  Simvastatin Other (See Comments)    Reaction unknown    No family history on file.   Prior to Admission medications   Medication Sig Start Date End Date Taking? Authorizing Provider  amLODipine (NORVASC) 10 MG tablet Take 10 mg by mouth daily. 08/16/13   Historical Provider, MD  bisacodyl (DULCOLAX) 5 MG EC tablet Take 5 mg by mouth daily as needed for moderate constipation.    Historical Provider, MD  calcitRIOL (ROCALTROL) 0.25 MCG capsule Take 3 capsules (0.75 mcg total) by mouth every Monday, Wednesday, and Friday with hemodialysis. 09/09/13   Belkys A Regalado, MD  calcium acetate (PHOSLO) 667 MG capsule Take 667 mg by mouth with snacks.    Historical Provider, MD  carvedilol (COREG) 3.125 MG tablet Take 1 tablet (3.125 mg total) by mouth 2 (two) times daily with a meal. 09/09/13   Belkys A Regalado, MD  cloNIDine (CATAPRES) 0.1 MG tablet Take 0.1 mg by mouth daily.  12/20/12   Historical Provider, MD  dexamethasone (DECADRON) 4 MG tablet Take 1 tablet with meals twice a day for 3 days then 1 tablet daily with meals for 2 days then stop. 09/09/13   Belkys A Regalado, MD  diclofenac sodium (VOLTAREN) 1 % GEL Apply 2 g topically as needed (for pain). 09/09/13   Belkys A Regalado, MD  dicyclomine (BENTYL) 20 MG tablet Take 20 mg by mouth every 6 (six) hours.    Historical Provider, MD  docusate sodium (COLACE) 100 MG capsule Take 100 mg by mouth 2 (two) times daily.    Historical Provider, MD  esomeprazole (NEXIUM) 40 MG capsule Take 40 mg by mouth daily.    Historical Provider, MD  fentaNYL (DURAGESIC - DOSED MCG/HR) 25 MCG/HR patch Place 25 mcg onto the skin every 3 (three) days.  02/08/13   Historical Provider, MD  losartan (COZAAR) 50 MG tablet Take 50 mg by mouth at bedtime.     Historical Provider, MD  multivitamin (RENA-VIT) TABS tablet Take 1 tablet by mouth at bedtime.    Historical Provider, MD  Nutritional Supplements (FEEDING SUPPLEMENT, NEPRO CARB STEADY,) LIQD Take 237 mLs by mouth 2  (two) times daily between meals. 09/09/13   Belkys A Regalado, MD  oxyCODONE (ROXICODONE) 5 MG immediate release tablet Take 1 tablet (5 mg total) by mouth every 4 (four) hours as needed for severe pain. 09/09/13   Belkys A Regalado, MD  rOPINIRole (REQUIP) 1 MG tablet Take 1 mg by mouth at bedtime.    Historical Provider, MD  senna (SENOKOT) 8.6 MG TABS tablet Take 2 tablets by mouth 2 (two) times daily as needed for mild constipation.     Historical Provider, MD  SENSIPAR 30 MG tablet Take 30 mg by mouth daily. 08/11/13   Historical Provider, MD  zolpidem (AMBIEN) 5 MG tablet Take 5 mg by mouth at bedtime as needed for sleep.    Historical Provider, MD   Physical Exam: Filed Vitals:   12/10/13 1335  BP: 126/73  Pulse: 72  Resp: 17  SpO2: 100%    Wt  Readings from Last 3 Encounters:  09/09/13 51 kg (112 lb 7 oz)  08/23/13 52.164 kg (115 lb)  10/20/12 56 kg (123 lb 7.3 oz)    General:  Chronically ill-appearing, mild distress, requesting pain medication Eyes: PERRL, normal lids, irises & conjunctiva, poor dentition ENT: grossly normal hearing, lips & tongue Neck: no LAD, masses or thyromegaly Cardiovascular: RRR, 3-6 systolic ejection murmur. No LE edema. Telemetry: SR, no arrhythmias  Respiratory: CTA bilaterally, no w/r/r. Normal respiratory effort. Abdomen: soft, ntnd Skin: no rash or induration seen on limited exam Musculoskeletal: grossly normal tone BUE/BLE Psychiatric: grossly normal mood and affect, speech fluent and appropriate Neurologic: grossly non-focal.          Labs on Admission:  Basic Metabolic Panel: No results for input(s): NA, K, CL, CO2, GLUCOSE, BUN, CREATININE, CALCIUM, MG, PHOS in the last 168 hours. Liver Function Tests: No results for input(s): AST, ALT, ALKPHOS, BILITOT, PROT, ALBUMIN in the last 168 hours. No results for input(s): LIPASE, AMYLASE in the last 168 hours. No results for input(s): AMMONIA in the last 168 hours. CBC: No results for  input(s): WBC, NEUTROABS, HGB, HCT, MCV, PLT in the last 168 hours. Cardiac Enzymes: No results for input(s): CKTOTAL, CKMB, CKMBINDEX, TROPONINI in the last 168 hours.  BNP (last 3 results) No results for input(s): PROBNP in the last 8760 hours. CBG: No results for input(s): GLUCAP in the last 168 hours.  Radiological Exams on Admission: No results found.  EKG: Independently reviewed.   Assessment/Plan Principal Problem:   Anemia in chronic kidney disease Active Problems:   ESRD (end stage renal disease) on dialysis   HTN (hypertension)   Generalized weakness   Abdominal pain   Spinal stenosis, thoracic with nerve root compression   Anemia   1. Acute on chronic anemia. Patient having a history of chronic anemia likely secondary to end-stage renal disease. Scented to outside hospital with complaints of generalized weakness, fatigue, found to have hemoglobin of 6.4 with hematocrit of 20.4. She denies bright red blood per rectum, melena, hematemesis. Per report she was guaiac-negative. She is a TEFL teacherJehovah's Witness and declines transfusion with blood products. Would likely benefit from aranesp, will discuss further with nephrology. 2. End-stage renal disease. Patient with history of end-stage renal disease on hemodialysis on Mondays Wednesdays and Fridays, stating she undergoes hemodialysis at [facility] in [place]. She Mr. dialysis session yesterday reporting feeling too weak to go. Will consult nephrology. 3. Hypertension. We'll continue losartan 50 mg by mouth daily, carvedilol 3.125 mg by mouth twice a day, amlodipine 10 mg by mouth daily. 4. Chronic abdominal pain. Patient reports ongoing abdominal pain lab work done at [facility] showed old phosphatase, AST, ALT within normal limits. Will check a lipase level. Based on records it was felt that abdominal pain could be referred from spinal stenosis, thoracic nerve root compression. 5. Spinal stenosis. Patient was evaluated by neurosurgery  in August 2015 not felt to be surgical candidate given patient's poor bone quality. 6. History of bacterial endocarditis. Patient afebrile. She last had transthoracic echocardiogram on 09/04/2013 which not show vegetations.  7. DVT prophylaxis. Subcutaneous heparin    Code Status: Full Code. I discussed code status with patient, she wishes to be a full code until she speaks with her church congregation.  Family Communication:  Disposition Plan: Anticipate she will require greater than 2 nights hospitalizaiton  Time spent: 70 min  Jeralyn BennettZAMORA, Bijan Ridgley [facility] Pager [phone]

## 2013-12-11 DIAGNOSIS — D696 Thrombocytopenia, unspecified: Secondary | ICD-10-CM

## 2013-12-11 LAB — BASIC METABOLIC PANEL
Anion gap: 11 (ref 5–15)
BUN: 15 mg/dL (ref 6–23)
CALCIUM: 9 mg/dL (ref 8.4–10.5)
CO2: 28 meq/L (ref 19–32)
CREATININE: 2.52 mg/dL — AB (ref 0.50–1.10)
Chloride: 101 mEq/L (ref 96–112)
GFR calc Af Amer: 20 mL/min — ABNORMAL LOW (ref >90)
GFR calc non Af Amer: 17 mL/min — ABNORMAL LOW (ref >90)
GLUCOSE: 95 mg/dL (ref 70–99)
Potassium: 3.7 mEq/L (ref 3.7–5.3)
Sodium: 140 mEq/L (ref 137–147)

## 2013-12-11 LAB — CBC
HEMATOCRIT: 19.6 % — AB (ref 36.0–46.0)
Hemoglobin: 6 g/dL — CL (ref 12.0–15.0)
MCH: 31.7 pg (ref 26.0–34.0)
MCHC: 30.6 g/dL (ref 30.0–36.0)
MCV: 103.7 fL — AB (ref 78.0–100.0)
Platelets: 122 10*3/uL — ABNORMAL LOW (ref 150–400)
RBC: 1.89 MIL/uL — ABNORMAL LOW (ref 3.87–5.11)
RDW: 21 % — AB (ref 11.5–15.5)
WBC: 4.4 10*3/uL (ref 4.0–10.5)

## 2013-12-11 LAB — PHOSPHORUS: Phosphorus: 2.9 mg/dL (ref 2.3–4.6)

## 2013-12-11 LAB — IRON AND TIBC
Iron: 67 ug/dL (ref 42–135)
Saturation Ratios: 42 % (ref 20–55)
TIBC: 158 ug/dL — ABNORMAL LOW (ref 250–470)
UIBC: 91 ug/dL — ABNORMAL LOW (ref 125–400)

## 2013-12-11 LAB — FERRITIN: FERRITIN: 797 ng/mL — AB (ref 10–291)

## 2013-12-11 MED ORDER — POLYETHYLENE GLYCOL 3350 17 G PO PACK
17.0000 g | PACK | Freq: Every day | ORAL | Status: DC
  Administered 2013-12-11 – 2013-12-15 (×3): 17 g via ORAL
  Filled 2013-12-11 (×6): qty 1

## 2013-12-11 MED ORDER — MORPHINE SULFATE 2 MG/ML IJ SOLN: 1.0000 mg | Freq: Once | INTRAMUSCULAR | Status: DC

## 2013-12-11 MED ORDER — MORPHINE SULFATE 2 MG/ML IJ SOLN
INTRAMUSCULAR | Status: AC
  Filled 2013-12-11: qty 1

## 2013-12-11 NOTE — Procedures (Signed)
I was present at this dialysis session, have reviewed the session itself and made  appropriate changes  Vinson Moselleob Alejandro Gamel MD (pgr) [phone]    ([phone]) [phone] 12/11/2013, 12:51 PM

## 2013-12-11 NOTE — Progress Notes (Addendum)
CRITICAL VALUE ALERT  Critical value received:  Hgb 6.0  Date of notification:  12/11/2013  Time of notification:  0401  Critical value read back:Yes.    Nurse who received alert:  Cyril LoosenMargaret Charles Niese, RN  MD notified (1st page):  Criselda PeachesMullen, E  Time of first page:  0720  MD notified (2nd page):  Time of second page:  Responding MD:  Melany GuernseyMullens, E  Time MD responded:  [phone]

## 2013-12-11 NOTE — Progress Notes (Signed)
Subjective:   Feels okay but is concerned about her blood counts. Back and abd pain  Objective Filed Vitals:   12/10/13 2315 12/10/13 2317 12/11/13 0349 12/11/13 0745  BP: 124/68 124/68 99/58 128/67  Pulse: 74 75 61 54  Temp:  97.8 F (36.6 C) 97.9 F (36.6 C) 97.8 F (36.6 C)  TempSrc:  Oral Oral Oral  Resp:  16 21 20   Height:      Weight:   56.3 kg (124 lb 1.9 oz)   SpO2:  98% 99% 100%   Physical Exam General: alert and oriented. Teary. No acute distress, asking for pain medication Heart: RRR  Lungs: CTA, unlabored  Abdomen: soft. nontender +BS Extremities:no edema Dialysis Access:  L AVF patent on HD  HD: MWF [facility] 4h 400/600 51kg 2/2.0 Bath LUA AVF Heparin none  Hect 4 ug TIW, Aranesp 50 ug / wk, Venofer 100 mg / hd x 5 thru 11/25  Assessment/Plan: 1. Anemia - Hgb 6 Aranesp increased to 150 q week. Fe panel pending. No heparin. No transfusions (Jehovah'sWitness) 2. ESRD - MWF [facility], HD today for holiday schedule. K+ 3.7 4. Secondary hyperparathyroidism - Ca+ 9, phos 2.9. Cont sensipar and calcitiriol. hold binders 5. HTN/volume - 128/67 2kgs over edw. BP variable, meds on hold.  6. Nutrition - renal diet, mult vit. Nepro.  7. Debility/ hx thoracic bony destruction T10/11 with chronic pain radicular pain  Jetty DuhamelBridget Whelan, NP [facility] Beeper [phone] 12/11/2013,10:28 AM  LOS: 1 day   Pt seen, examined and agree w A/P as above.  Vinson Moselleob Alyxander Kollmann MD pager [phone]    cell [phone] 12/11/2013, 12:49 PM    Additional Objective Labs: Basic Metabolic Panel:  Recent Labs Lab 12/11/13 0200 12/11/13 0302  NA  --  140  K  --  3.7  CL  --  101  CO2  --  28  GLUCOSE  --  95  BUN  --  15  CREATININE  --  2.52*  CALCIUM  --  9.0  PHOS 2.9  --    Liver Function Tests: No results for input(s): AST, ALT, ALKPHOS, BILITOT, PROT, ALBUMIN in the last 168 hours. No results for input(s): LIPASE, AMYLASE in the last 168 hours. CBC:  Recent  Labs Lab 12/11/13 0302  WBC 4.4  HGB 6.0*  HCT 19.6*  MCV 103.7*  PLT 122*   Blood Culture    Component Value Date/Time   SDES BLOOD RIGHT HAND 09/04/2013 1305   SPECREQUEST BOTTLES DRAWN AEROBIC ONLY 10CC 09/04/2013 1305   CULT  09/04/2013 1305    NO GROWTH 5 DAYS Performed at [facility]   REPTSTATUS 09/10/2013 FINAL 09/04/2013 1305    Cardiac Enzymes: No results for input(s): CKTOTAL, CKMB, CKMBINDEX, TROPONINI in the last 168 hours. CBG:  Recent Labs Lab 12/10/13 2156  GLUCAP 89   Iron Studies: No results for input(s): IRON, TIBC, TRANSFERRIN, FERRITIN in the last 72 hours. @lablastinr3 @ Studies/Results: No results found. Medications:   . [START ON 12/12/2013] calcitRIOL  0.75 mcg Oral Q M,W,F-HD  . calcium acetate  667 mg Oral With snacks  . cinacalcet  30 mg Oral Q breakfast  . [START ON 12/14/2013] darbepoetin (ARANESP) injection - DIALYSIS  150 mcg Intravenous Q Wed-HD  . dicyclomine  20 mg Oral Q6H  . docusate sodium  100 mg Oral BID  . feeding supplement (NEPRO CARB STEADY)  237 mL Oral BID BM  . fentaNYL  25 mcg Transdermal Q72H  . heparin  5,000 Units Subcutaneous 3 times per day  . multivitamin  1 tablet Oral QHS  . pantoprazole  40 mg Oral Daily  . rOPINIRole  1 mg Oral QHS  . sodium chloride  3 mL Intravenous Q12H  . sodium chloride  3 mL Intravenous Q12H

## 2013-12-11 NOTE — Progress Notes (Signed)
Patient ID: Julia Small, female   DOB: 1935/08/04, 78 y.o.   MRN: [id number] [facility] PROGRESS NOTE  Julia Small WUJ:[id number] DOB: 1935/08/04 DOA: 12/10/2013 PCP: Lise AuerKHAN,JABER A, MD  Brief narrative:    78 y.o. female with a past medical history of end-stage renal disease undergoing hemodialysis on Mondays Wednesdays Fridays, history of chronic anemia, hypertension, presenting as a transfer from [facility]. She originally presented to [facility] with complaints of generalized weakness with associated shortness of breath, progressively worsening over the past several days. She reported feeling too weak to undergo HD. On admission, hemoglobin was 6.4, creatinine of 4.5, potassium was 3.6 and bicarbonate 33. Patient is a Jehovah witness and declines having blood products.   Assessment/Plan:    Principal Problem:   Anemia in chronic kidney disease - anemia due to ESRD - hemoglobin 6.0 this morning - Patient is Jehovah witness and has declined blood products - cautions with blood draws, should minimize and preserve counts - continue aranesp with HD Active Problems:   ESRD (end stage renal disease) on dialysis - HD MWF - management per renal  - continue calcitrol, sensipar Generalized weakness / Spinal stenosis, thoracic with nerve root compression  Abdominal pain - physical therapy once patient able to participate  Thrombocytopenia - platelet count 122 - would stop heparin and use SCD's for DVT prophylaxis    DVT Prophylaxis  - will switch from heparin sub Q to SCD's due to low hemoglobin and thrombocytopenia   Code Status: Full.  Family Communication:  plan of care discussed with the patient Disposition Plan: PT evaluation pending, remains in SDU   IV access:   Peripheral IV  Procedures and diagnostic studies:    No results found.  Medical Consultants:   Nephrology, Dr. Delano Metzobert Schertz  Other Consultants:   Physical therapy   IAnti-Infectives:     None    Manson PasseyEVINE, Joleena Weisenburger, MD  [facility] Pager [phone]  If 7PM-7AM, please contact night-coverage [email or web address] Password [email or web address] 12/11/2013, 8:10 AM   LOS: 1 day    HPI/Subjective: No acute overnight events.  Objective: Filed Vitals:   12/10/13 2315 12/10/13 2317 12/11/13 0349 12/11/13 0745  BP: 124/68 124/68 99/58 128/67  Pulse: 74 75 61 54  Temp:  97.8 F (36.6 C) 97.9 F (36.6 C) 97.8 F (36.6 C)  TempSrc:  Oral Oral Oral  Resp:  16 21 20   Height:      Weight:   56.3 kg (124 lb 1.9 oz)   SpO2:  98% 99% 100%    Intake/Output Summary (Last 24 hours) at 12/11/13 0810 Last data filed at 12/11/13 0600  Gross per 24 hour  Intake    240 ml  Output    161 ml  Net     79 ml    Exam:   General:  Pt is sleeping this am, no distress  Cardiovascular: Regular rate and rhythm, S1/S2 appreciated, SEM soft +2/6  Respiratory: Clear to auscultation bilaterally, no wheezing, no crackles, no rhonchi  Abdomen: Soft, non tender, non distended, bowel sounds present  Extremities: No edema, pulses DP and PT palpable bilaterally  Neuro: Grossly nonfocal  Data Reviewed: Basic Metabolic Panel:  Recent Labs Lab 12/11/13 0200 12/11/13 0302  NA  --  140  K  --  3.7  CL  --  101  CO2  --  28  GLUCOSE  --  95  BUN  --  15  CREATININE  --  2.52*  CALCIUM  --  9.0  PHOS 2.9  --    Liver Function Tests: No results for input(s): AST, ALT, ALKPHOS, BILITOT, PROT, ALBUMIN in the last 168 hours. No results for input(s): LIPASE, AMYLASE in the last 168 hours. No results for input(s): AMMONIA in the last 168 hours. CBC:  Recent Labs Lab 12/11/13 0302  WBC 4.4  HGB 6.0*  HCT 19.6*  MCV 103.7*  PLT 122*   Cardiac Enzymes: No results for input(s): CKTOTAL, CKMB, CKMBINDEX, TROPONINI in the last 168 hours. BNP: Invalid input(s): POCBNP CBG:  Recent Labs Lab 12/10/13 2156  GLUCAP 89    Recent Results (from the past 240 hour(s))  MRSA PCR Screening      Status: None   Collection Time: 12/10/13  3:40 PM  Result Value Ref Range Status   MRSA by PCR NEGATIVE NEGATIVE Final    Comment:           Scheduled Meds: . amLODipine  10 mg Oral Daily  .  12/12/2013] calcitRIOL  0.75 mcg Oral Q M,W,F-HD  . calcium acetate  667 mg Oral With snacks  . carvedilol  3.125 mg Oral BID WC  . cinacalcet  30 mg Oral Q breakfast  . cloNIDine  0.1 mg Oral Daily  .  (ARANESP) injection  150 mcg Intravenous Q Wed-HD  . dicyclomine  20 mg Oral Q6H  . docusate sodium  100 mg Oral BID  .  (NEPRO CARB STEADY)  237 mL Oral BID BM  . fentaNYL  25 mcg Transdermal Q72H  . heparin  5,000 Units Subcutaneous 3 times per day  . losartan  50 mg Oral QHS  . multivitamin  1 tablet Oral QHS  . pantoprazole  40 mg Oral Daily  . rOPINIRole  1 mg Oral QHS

## 2013-12-12 DIAGNOSIS — E43 Unspecified severe protein-calorie malnutrition: Secondary | ICD-10-CM | POA: Insufficient documentation

## 2013-12-12 LAB — IRON AND TIBC
IRON: 60 ug/dL (ref 42–135)
Saturation Ratios: 37 % (ref 20–55)
TIBC: 161 ug/dL — ABNORMAL LOW (ref 250–470)
UIBC: 101 ug/dL — ABNORMAL LOW (ref 125–400)

## 2013-12-12 LAB — TECHNOLOGIST SMEAR REVIEW

## 2013-12-12 LAB — LACTATE DEHYDROGENASE: LDH: 287 U/L — ABNORMAL HIGH (ref 94–250)

## 2013-12-12 LAB — HAPTOGLOBIN: Haptoglobin: <25 mg/dL — ABNORMAL LOW (ref 45–215)

## 2013-12-12 MED ORDER — DARBEPOETIN ALFA 150 MCG/0.3ML IJ SOSY: 150.0000 ug | PREFILLED_SYRINGE | INTRAMUSCULAR | Status: DC

## 2013-12-12 NOTE — Progress Notes (Signed)
Utilization review completed. Lonzo Saulter, RN, BSN. 

## 2013-12-12 NOTE — Progress Notes (Signed)
INITIAL NUTRITION ASSESSMENT  DOCUMENTATION CODES Per approved criteria  -Severe malnutrition in the context of chronic illness   Pt meets criteria for severe MALNUTRITION in the context of chronic illness as evidenced by severe depletion of muscle and subcutaneous fat mass.  INTERVENTION:  Continue Nepro Shake po BID, each supplement provides 425 kcal and 19 grams protein  NUTRITION DIAGNOSIS: Malnutriton related to chronic renal disease as evidenced by severe depletion of muscle and subcutaneous fat mass.   Goal: Intake to meet >90% of estimated nutrition needs.  Monitor:  PO intake, labs, weight trend.  Reason for Assessment: MST  78 y.o. female  Admitting Dx: Anemia in chronic kidney disease  ASSESSMENT: 78 y.o. female with a past medical history of end-stage renal disease undergoing hemodialysis on Mondays Wednesdays Fridays, history of chronic anemia, hypertension, presenting as a transfer from [facility]. She originally presented to [facility] with complaints of generalized weakness with associated shortness of breath, progressively worsening over the past several days, missed HD on 11/20.  Patient reports that she gets her food from a food bank and her family brings her some food items as well. She does not like the hospital food because it is not seasoned enough. She likes [employer]obo seasoning, which has a lot of sodium. Discussed switching to a seasoning without sodium. Patient endorses a large amount of weight loss, she used to weight 265 lb. Down to 135 lb a few months ago, now down to 113 lb post-dialysis.   Nutrition Focused Physical Exam:  Subcutaneous Fat:  Orbital Region: mild depleiton Upper Arm Region: severe depletion Thoracic and Lumbar Region: NA  Muscle:  Temple Region: mild depletion Clavicle Bone Region: severe depletion Clavicle and Acromion Bone Region: severe dpeletion Scapular Bone Region: NA Dorsal Hand: moderate depletion Patellar Region:  moderate depletion Anterior Thigh Region: moderate depletion Posterior Calf Region: mild depletion   Height: Ht Readings from Last 1 Encounters:  12/10/13 4\' 11"  (1.499 m)    Weight: Wt Readings from Last 1 Encounters:  12/12/13 121 lb 14.6 oz (55.3 kg)  (pre-dialysis weight)  Ideal Body Weight: 44.5 kg  % Ideal Body Weight: 124%  Wt Readings from Last 10 Encounters:  12/12/13 121 lb 14.6 oz (55.3 kg)  09/09/13 112 lb 7 oz (51 kg)  08/23/13 115 lb (52.164 kg)  10/20/12 123 lb 7.3 oz (56 kg)  09/14/12 134 lb 8 oz (61.009 kg)  09/07/12 132 lb 8 oz (60.102 kg)  08/11/12 135 lb 9.3 oz (61.5 kg)    Usual Body Weight: 123 lb (1 year ago)  % Usual Body Weight: 92%  BMI:  Body mass index is 24.61 kg/(m^2).  Estimated Nutritional Needs: Kcal: 1600-1800 Protein: 70-80 gm Fluid: 1.2 L  Skin: no wounds  Diet Order: Diet renal W/124200mL fluid restriction  EDUCATION NEEDS: -Education needs addressed   Intake/Output Summary (Last 24 hours) at 12/12/13 1038 Last data filed at 12/11/13 2000  Gross per 24 hour  Intake    440 ml  Output   2000 ml  Net  -1560 ml    Last BM: 11/23   Labs:   Recent Labs Lab 12/11/13 0200 12/11/13 0302  NA  --  140  K  --  3.7  CL  --  101  CO2  --  28  BUN  --  15  CREATININE  --  2.52*  CALCIUM  --  9.0  PHOS 2.9  --   GLUCOSE  --  95    CBG (  last 3)   Recent Labs  12/10/13 2156  GLUCAP 89    Scheduled Meds: . calcitRIOL  0.75 mcg Oral Q M,W,F-HD  . cinacalcet  30 mg Oral Q breakfast  . [START ON 12/13/2013] darbepoetin (ARANESP) injection - DIALYSIS  150 mcg Intravenous Q Tue-HD  . dicyclomine  20 mg Oral Q6H  . docusate sodium  100 mg Oral BID  . feeding supplement (NEPRO CARB STEADY)  237 mL Oral BID BM  . fentaNYL  25 mcg Transdermal Q72H  .  morphine injection  1 mg Intravenous Once  . multivitamin  1 tablet Oral QHS  . pantoprazole  40 mg Oral Daily  . polyethylene glycol  17 g Oral Daily  . rOPINIRole   1 mg Oral QHS  . sodium chloride  3 mL Intravenous Q12H  . sodium chloride  3 mL Intravenous Q12H    Continuous Infusions:   Past Medical History  Diagnosis Date  . Hypertension   . GERD (gastroesophageal reflux disease)   . Carpal tunnel syndrome   . Secondary hyperparathyroidism (of renal origin)   . Anginal pain   . Pneumonia   . On home oxygen therapy     "2L prn" (09/02/2013)  . H/O hiatal hernia   . Anemia   . Hepatitis C     "I'm suppose to be clear"  . Headache(784.0)     "when I'm in pain" (09/02/2013)  . CVA (cerebral vascular accident)     residual left sided weakness  . Osteoarthritis     "back, knees, hand" (09/02/2013)  . Anxiety   . Depression   . ESRD (end stage renal disease) on dialysis 08/06/2012    [place]. [place] and got HD in [place], [place] for about a year then moved to [place] and has been getting HD there since. She is on MWF schedule. ESRD was caused by HTN.       Past Surgical History  Procedure Laterality Date  . Ankle reconstruction Left     "got 5 screws and 2 bolts in there"  . Cesarean section  X 3  . Sp av dialysis shunt access existing *l* Left   . Abdominal hysterectomy    . Cataract extraction, bilateral Bilateral   . Cardiac catheterization      Joaquin CourtsKimberly Harris, RD, LDN, CNSC Pager [phone] After Hours Pager [phone]

## 2013-12-12 NOTE — Evaluation (Signed)
Physical Therapy Evaluation Patient Details Name: Julia EdwardsBarbara Small MRN: [id number] DOB: [birth year] Today's Date: 12/12/2013   History of Present Illness  78 y.o. female admitted with abdominal and back pain. Current problem list includes: ESRD, anemia, metabolic bone disease, with Hx of CVA.  Clinical Impression  Pt admitted with above. Pt currently with functional limitations due to the deficits listed below (see PT Problem List). +2 max assist required on eval for transfers. Pt will benefit from skilled PT to increase their independence and safety with mobility to allow discharge to the venue listed below. Recommend SNF at d/c for continued therapy.      Follow Up Recommendations SNF;Supervision/Assistance - 24 hour    Equipment Recommendations  None recommended by PT    Recommendations for Other Services       Precautions / Restrictions Precautions Precautions: Fall      Mobility  Bed Mobility Overal bed mobility: Needs Assistance Bed Mobility: Supine to Sit     Supine to sit: Mod assist     General bed mobility comments: cues for sequencing  Transfers Overall transfer level: Needs assistance Equipment used: Rolling walker (2 wheeled) Transfers: Sit to/from BJ'sStand;Stand Pivot Transfers Sit to Stand: +2 physical assistance;Max assist Stand pivot transfers: +2 physical assistance;Max assist       General transfer comment: RW used for sit to stand.  Knees buckling so pt returned to EOB.  RW removed and +2 assist SPT performed bed to recliner.  Ambulation/Gait                Stairs            Wheelchair Mobility    Modified Rankin (Stroke Patients Only)       Balance Overall balance assessment: History of Falls                                           Pertinent Vitals/Pain Pain Assessment: 0-10 Pain Score: 6  Pain Location: abdomen and back Pain Intervention(s): Monitored during session;Repositioned    Home Living  Family/patient expects to be discharged to:: Assisted living Living Arrangements: Alone                    Prior Function Level of Independence: Needs assistance   Gait / Transfers Assistance Needed: uses rollator for ambulation or w/c  ADL's / Homemaking Assistance Needed: performs bath/dress independently. bathes at sink. Has aide clean home 2x/week.        Hand Dominance   Dominant Hand: Right    Extremity/Trunk Assessment   Upper Extremity Assessment: Generalized weakness           Lower Extremity Assessment: Generalized weakness (greater strength deficits in LLE as compared to RLE due to previous CVA)      Cervical / Trunk Assessment: Kyphotic  Communication   Communication: No difficulties  Cognition Arousal/Alertness: Awake/alert Behavior During Therapy: Anxious Overall Cognitive Status: Within Functional Limits for tasks assessed                      General Comments      Exercises        Assessment/Plan    PT Assessment Patient needs continued PT services  PT Diagnosis Difficulty walking;Generalized weakness;Acute pain   PT Problem List Decreased strength;Decreased activity tolerance;Decreased balance;Decreased mobility;Pain  PT Treatment Interventions DME instruction;Gait training;Functional mobility training;Therapeutic activities;Therapeutic  exercise;Patient/family education;Balance training   PT Goals (Current goals can be found in the Care Plan section) Acute Rehab PT Goals Patient Stated Goal: to get a power w/c PT Goal Formulation: With patient Time For Goal Achievement: 12/26/13 Potential to Achieve Goals: Good    Frequency Min 2X/week   Barriers to discharge Decreased caregiver support      Co-evaluation               End of Session Equipment Utilized During Treatment: Gait belt Activity Tolerance: Patient limited by fatigue;Patient limited by pain Patient left: in chair;with call bell/phone within  reach Nurse Communication: Mobility status         Time: [id number] PT Time Calculation (min) (ACUTE ONLY): 25 min   Charges:   PT Evaluation $Initial PT Evaluation Tier I: 1 Procedure PT Treatments $Therapeutic Activity: 8-22 mins   PT G Codes:          Ilda FoilGarrow, Debroh Sieloff Rene 12/12/2013, 11:25 AM

## 2013-12-12 NOTE — Progress Notes (Addendum)
[facility] Rounding Note Subjective:    Worried about her blood counts "You've gotten them up before" Reassured that we have increased her Aranesp  Objective Filed Vitals:   12/11/13 2029 12/12/13 0044 12/12/13 0406 12/12/13 0700  BP:  135/72 149/77   Pulse:  66 73   Temp: 97.8 F (36.6 C) 98.6 F (37 C) 98.6 F (37 C) 97.9 F (36.6 C)  TempSrc: Oral Oral Oral Oral  Resp:  19 24   Height:      Weight:   55.3 kg (121 lb 14.6 oz)   SpO2:  100% 98%    Physical Exam General: alert and oriented. Heart: Regular S1S2 No S3  2/6 murmur USB No diastolic murmur or rub  Lungs: Clear Abdomen: Soft and non-tender Extremities: No sig LE edema Dialysis Access:  Left upper arm AVF patient  Additional Objective Labs: No new labs today   Recent Labs Lab 12/11/13 0200 12/11/13 0302  NA  --  140  K  --  3.7  CL  --  101  CO2  --  28  GLUCOSE  --  95  BUN  --  15  CREATININE  --  2.52*  CALCIUM  --  9.0  PHOS 2.9  --     Recent Labs Lab 12/11/13 0302  WBC 4.4  HGB 6.0*  HCT 19.6*  MCV 103.7*  PLT 122*   Blood Culture    Component Value Date/Time   SDES BLOOD RIGHT HAND 09/04/2013 1305   SPECREQUEST BOTTLES DRAWN AEROBIC ONLY 10CC 09/04/2013 1305   CULT  09/04/2013 1305    NO GROWTH 5 DAYS Performed at [facility]   REPTSTATUS 09/10/2013 FINAL 09/04/2013 1305    Cardiac Enzymes: No results for input(s): CKTOTAL, CKMB, CKMBINDEX, TROPONINI in the last 168 hours. CBG:  Recent Labs Lab 12/10/13 2156  GLUCAP 89   Iron Studies:   Recent Labs  12/11/13 1052  IRON 60  TIBC 161*  FERRITIN 797*   Medications:   . calcitRIOL  0.75 mcg Oral Q M,W,F-HD  . cinacalcet  30 mg Oral Q breakfast  . [START ON 12/14/2013] darbepoetin (ARANESP) injection - DIALYSIS  150 mcg Intravenous Q Wed-HD  . dicyclomine  20 mg Oral Q6H  . docusate sodium  100 mg Oral BID  . feeding supplement (NEPRO CARB STEADY)  237 mL Oral BID BM  . fentaNYL  25 mcg  Transdermal Q72H  .  morphine injection  1 mg Intravenous Once  . multivitamin  1 tablet Oral QHS  . pantoprazole  40 mg Oral Daily  . polyethylene glycol  17 g Oral Daily  . rOPINIRole  1 mg Oral QHS  . sodium chloride  3 mL Intravenous Q12H  . sodium chloride  3 mL Intravenous Q12H    HD: MWF [facility] 4h 400/600 51kg 2/2.0 Bath LUA AVF Heparin none  Hect 4 ug TIW, Aranesp 50 ug / wk, increased to 150/week this adm Venofer 100 mg / hd x 5 thru 11/25  Assessment/Plan: 1. Anemia - Hgb 6  (from 8.6 last check at outpt unit - had been getting increasing doses of Aranesp (was at 50/wk)but was not responding - had been in the 8's then rather abruptly dropped to 5.9 at the outpt unit). FOB negative. Aranesp now increased to 150 q week. Fe replete.. No heparin. No transfusions (Jehovah[employer]itness) Will take time to see increase. Minimize lab draws. 2. ESRD - MWF [facility], HD Tues for holiday schedule (MWF=SunTuesFri).  4. Secondary hyperparathyroidism - Ca+ 9, phos 2.9. Cont sensipar and calcitiriol. hold binders 5. HTN/volume - 128/67 2kgs over edw. BP variable, meds on hold.  6. Nutrition - renal diet, mult vit. Nepro.  7. Debility/ hx thoracic bony destruction T10/11 with chronic pain radicular pain  Camille Balynthia Alea Ryer, MD [facility] [phone] Pager 12/12/2013, 10:09 AM

## 2013-12-12 NOTE — Progress Notes (Signed)
[facility]  Yevette EdwardsBarbara Lawn WUJ:[id number] DOB: July 08, 1935 DOA: 12/10/2013 PCP: Lise AuerKHAN,JABER A, MD  Brief narrative: 78 y.o. female with a history of end-stage renal disease undergoing hemodialysis on Mondays Wednesdays Fridays, chronic anemia, and hypertension who presented as a [facility]. She originally presented [facility] with complaints of generalized weakness with associated shortness of breath, progressively worsening over several days. She reported feeling too weak to undergo HD. On admission, hemoglobin was 6.4, creatinine of 4.5, potassium was 3.6 and bicarbonate 33. Patient is a Jehovah witness and declines having blood products.  HPI/Subjective: Alert. Verbalizing frustration that she does not understand her current diagnoses and how the medications work. This was explained to her in detail at the bedside today. States now that she feels better since she now understand processes.  Assessment/Plan:  Anemia in chronic kidney disease / Thrombocytopenia -Multifactorial etiology: Likely chronic component from chronic kidney disease but acute component unclear -Noted with progressive thrombocytopenia since August -? due to consumption -Baseline hemoglobin around 8 with trends downward since August-most recent hemoglobin 6.0 on 11/22-hemodynamically stable so recommend only checking blood counts every 2-3 days -Heparin discontinued 11/22 because of thrombocytopenia -Peripheral smear without schistocytes and only mild elevation of LDH-Haptoglobin is pending-etiology possibly an evolving myelodysplastic syndrome (though this would not explain an acute drop in Hgb) - Patient is Jehovah witness and has declined blood products - caution with blood draws, should minimize and preserve counts - continue aranesp per Nephrology    ESRD (end stage renal disease) on dialysis - HD MWF - management per renal  - continue calcitrol,  sensipar   Generalized weakness / Spinal stenosis, thoracic with nerve root compression - PT evaluation in process -Current recommendations are for skilled nursing facility -Patient admits to obtaining food from food banks; she lives alone and is reluctant to ask friends and family for help   DVT prophylaxis: SCDs Code Status: Full Family Communication: No family at bedside Disposition Plan/Expected LOS: Transfer to telemetry   Consultants: Nephrology/Dr. Eliott Nineunham  Procedures: None  Cultures: None  Antibiotics: None  Objective: Blood pressure 149/77, pulse 73, temperature 97.9 F (36.6 C), temperature source Oral, resp. rate 24, height 4\' 11"  (1.499 m), weight 121 lb 14.6 oz (55.3 kg), SpO2 98 %.  Intake/Output Summary (Last 24 hours) at 12/12/13 1321 Last data filed at 12/11/13 2000  Gross per 24 hour  Intake    440 ml  Output   2000 ml  Net  -1560 ml   Exam: Gen: No acute respiratory distress Chest: Clear to auscultation bilaterally without wheezes, rhonchi or crackles, room air Cardiac: Regular rate and rhythm, S1-S2, no rubs murmurs or gallops, no peripheral edema, no JVD Abdomen: Soft nontender nondistended without obvious hepatosplenomegaly, no ascites Extremities: Symmetrical in appearance without cyanosis, clubbing or effusion  Scheduled Meds:  Scheduled Meds: . calcitRIOL  0.75 mcg Oral Q M,W,F-HD  . cinacalcet  30 mg Oral Q breakfast  . [START ON 12/13/2013] darbepoetin (ARANESP) injection - DIALYSIS  150 mcg Intravenous Q Tue-HD  . dicyclomine  20 mg Oral Q6H  . docusate sodium  100 mg Oral BID  . feeding supplement (NEPRO CARB STEADY)  237 mL Oral BID BM  . fentaNYL  25 mcg Transdermal Q72H  .  morphine injection  1 mg Intravenous Once  . multivitamin  1 tablet Oral QHS  . pantoprazole  40 mg Oral Daily  . polyethylene glycol  17 g Oral Daily  .  rOPINIRole  1 mg Oral QHS  . sodium chloride  3 mL Intravenous Q12H  . sodium chloride  3 mL  Intravenous Q12H   Data Reviewed: Basic Metabolic Panel:  Recent Labs Lab 12/11/13 0200 12/11/13 0302  NA  --  140  K  --  3.7  CL  --  101  CO2  --  28  GLUCOSE  --  95  BUN  --  15  CREATININE  --  2.52*  CALCIUM  --  9.0  PHOS 2.9  --    Liver Function Tests: No results for input(s): AST, ALT, ALKPHOS, BILITOT, PROT, ALBUMIN in the last 168 hours. No results for input(s): LIPASE, AMYLASE in the last 168 hours. No results for input(s): AMMONIA in the last 168 hours.   CBC:  Recent Labs Lab 12/11/13 0302  WBC 4.4  HGB 6.0*  HCT 19.6*  MCV 103.7*  PLT 122*   CBG:  Recent Labs Lab 12/10/13 2156  GLUCAP 89    Recent Results (from the past 240 hour(s))  MRSA PCR Screening     Status: None   Collection Time: 12/10/13  3:40 PM  Result Value Ref Range Status   MRSA by PCR NEGATIVE NEGATIVE Final    Comment:        The GeneXpert MRSA Assay (FDA approved for NASAL specimens only), is one component of a comprehensive MRSA colonization surveillance program. It is not intended to diagnose MRSA infection nor to guide or monitor treatment for MRSA infections.      Studies:  Recent x-ray studies have been reviewed in detail by the Attending Physician  Time spent :  35 mins  Junious Silkllison Ellis, ANP [facility] Office  [phone] Pager [phone]  On-Call/Text Page:      [email or web address]      password [email or web address]  If 7PM-7AM, please contact night-coverage [email or web address] Password [email or web address] 12/12/2013, 1:21 PM   LOS: 2 days   I have personally examined this patient and reviewed the entire database. I have reviewed the above note, made any necessary editorial changes, and agree with its content.  Lonia BloodJeffrey T. McClung, MD [facility]

## 2013-12-12 NOTE — Plan of Care (Signed)
Problem: Phase I Progression Outcomes Goal: Voiding-avoid urinary catheter unless indicated Outcome: Not Applicable Date Met:  83/77/93 aneuric  Problem: Phase II Progression Outcomes Goal: IV changed to normal saline lock Outcome: Completed/Met Date Met:  12/12/13

## 2013-12-13 ENCOUNTER — Inpatient Hospital Stay (HOSPITAL_COMMUNITY): Payer: Medicare Other

## 2013-12-13 DIAGNOSIS — E43 Unspecified severe protein-calorie malnutrition: Secondary | ICD-10-CM

## 2013-12-13 LAB — CBC
HEMATOCRIT: 22 % — AB (ref 36.0–46.0)
Hemoglobin: 6.8 g/dL — CL (ref 12.0–15.0)
MCH: 33.7 pg (ref 26.0–34.0)
MCHC: 30.9 g/dL (ref 30.0–36.0)
MCV: 108.9 fL — AB (ref 78.0–100.0)
PLATELETS: 121 10*3/uL — AB (ref 150–400)
RBC: 2.02 MIL/uL — ABNORMAL LOW (ref 3.87–5.11)
RDW: 22.3 % — ABNORMAL HIGH (ref 11.5–15.5)
WBC: 4.3 10*3/uL (ref 4.0–10.5)

## 2013-12-13 LAB — RENAL FUNCTION PANEL
Albumin: 3 g/dL — ABNORMAL LOW (ref 3.5–5.2)
Anion gap: 12 (ref 5–15)
BUN: 23 mg/dL (ref 6–23)
CHLORIDE: 100 meq/L (ref 96–112)
CO2: 26 mEq/L (ref 19–32)
CREATININE: 3.63 mg/dL — AB (ref 0.50–1.10)
Calcium: 9.7 mg/dL (ref 8.4–10.5)
GFR calc Af Amer: 13 mL/min — ABNORMAL LOW (ref >90)
GFR, EST NON AFRICAN AMERICAN: 11 mL/min — AB (ref >90)
Glucose, Bld: 93 mg/dL (ref 70–99)
Phosphorus: 3.4 mg/dL (ref 2.3–4.6)
Potassium: 5 mEq/L (ref 3.7–5.3)
Sodium: 138 mEq/L (ref 137–147)

## 2013-12-13 MED ORDER — DARBEPOETIN ALFA 150 MCG/0.3ML IJ SOSY
PREFILLED_SYRINGE | INTRAMUSCULAR | Status: AC
Start: 2013-12-13 — End: 2013-12-13
  Filled 2013-12-13: qty 0.3

## 2013-12-13 MED ORDER — GABAPENTIN 100 MG PO CAPS
100.0000 mg | ORAL_CAPSULE | Freq: Two times a day (BID) | ORAL | Status: DC
  Administered 2013-12-13 – 2013-12-16 (×6): 100 mg via ORAL
  Filled 2013-12-13 (×7): qty 1

## 2013-12-13 MED ORDER — MORPHINE SULFATE 2 MG/ML IJ SOLN
INTRAMUSCULAR | Status: AC
  Filled 2013-12-13: qty 1

## 2013-12-13 MED ORDER — DARBEPOETIN ALFA 150 MCG/0.3ML IJ SOSY
150.0000 ug | PREFILLED_SYRINGE | INTRAMUSCULAR | Status: DC
  Administered 2013-12-13: 150 ug via INTRAVENOUS

## 2013-12-13 NOTE — Plan of Care (Signed)
Problem: Phase II Progression Outcomes Goal: Obtain order to discontinue catheter if appropriate Outcome: Not Applicable Date Met:  24/81/85  Problem: Phase III Progression Outcomes Goal: Foley discontinued Outcome: Not Applicable Date Met:  90/93/11

## 2013-12-13 NOTE — Plan of Care (Signed)
Problem: Phase II Progression Outcomes Goal: Vital signs remain stable Outcome: Completed/Met Date Met:  12/13/13     

## 2013-12-13 NOTE — Progress Notes (Signed)
[facility] Rounding Note Subjective:    Seen in dialysis Anxious Still worrying about her blood counts CBC not back yet from this AM Having back pain which is chronic On fentanyl patch, getting some morphine right now  Objective Filed Vitals:   12/13/13 0500 12/13/13 0655 12/13/13 0709 12/13/13 0730  BP: 153/97 151/81 160/89 184/107  Pulse: 73 73 68 75  Temp: 98.1 F (36.7 C) 97.1 F (36.2 C)    TempSrc: Oral Oral    Resp: 18 21 15 22   Height:      Weight:  52.1 kg (114 lb 13.8 oz)    SpO2: 100%      Physical Exam General: alert and oriented. Very anxious. On dialysis. Poor dentition Heart: Regular S1S2 No S3  2/6 murmur USB No diastolic murmur or rub  Lungs: Clear without crackles or wheezes Abdomen: Soft and non-tender Extremities: No sig LE edema Dialysis Access:  Left upper arm AVF patient, cannulated, BFR 400  Additional Objective Labs: all pending from this AM   Recent Labs Lab 12/11/13 0200 12/11/13 0302  NA  --  140  K  --  3.7  CL  --  101  CO2  --  28  GLUCOSE  --  95  BUN  --  15  CREATININE  --  2.52*  CALCIUM  --  9.0  PHOS 2.9  --     Recent Labs Lab 12/11/13 0302  WBC 4.4  HGB 6.0*  HCT 19.6*  MCV 103.7*  PLT 122*   Blood Culture    Component Value Date/Time   SDES BLOOD RIGHT HAND 09/04/2013 1305   SPECREQUEST BOTTLES DRAWN AEROBIC ONLY 10CC 09/04/2013 1305   CULT  09/04/2013 1305    NO GROWTH 5 DAYS [facility]formed at [facility]   REPTSTATUS 09/10/2013 FINAL 09/04/2013 1305    Recent Labs Lab 12/10/13 2156  GLUCAP 89   I Recent Labs  12/11/13 1052  IRON 60  TIBC 161*  FERRITIN 797*   Medications:   . calcitRIOL  0.75 mcg Oral Q M,W,F-HD  . cinacalcet  30 mg Oral Q breakfast  . darbepoetin (ARANESP) injection - DIALYSIS  150 mcg Intravenous Q Tue-HD  . dicyclomine  20 mg Oral Q6H  . docusate sodium  100 mg Oral BID  . feeding supplement (NEPRO CARB STEADY)  237 mL Oral BID BM  . fentaNYL  25  mcg Transdermal Q72H  . morphine      . multivitamin  1 tablet Oral QHS  . pantoprazole  40 mg Oral Daily  . polyethylene glycol  17 g Oral Daily  . rOPINIRole  1 mg Oral QHS  acetaminophen **OR** acetaminophen, bisacodyl, morphine injection, ondansetron **OR** ondansetron (ZOFRAN) IV, oxyCODONE, senna, zolpidem  HD: MWF [facility] 4h 400/600 51kg 2/2.0 Bath LUA AVF Heparin none  Hect 4 ug TIW, Aranesp 50 ug / wk, increased to 150/week this adm Venofer 100 mg / hd x 5 thru 11/25  Assessment/Plan: 1. Anemia - Hgb 6 on admission  (from 8.6 last check at outpt unit - had been getting increasing doses of Aranesp (was at 50/wk)but was not responding - had been in the 8's then rather abruptly dropped to 5.9 at the outpt unit). FOB negative. Aranesp now increased to 150 q week. Fe replete. No heparin. No transfusions (Jehovah'sWitness) Will take time to see increase. Minimize lab draws. Note haptoglobin low but LDH only minimally elevated. ?? If some element of hemolysis??? Also noting plts 122  on adm (258K as outpt 11/16/13) Await today's CBC 2. ESRD - MWF [facility], HD today for holiday schedule (MWF=SunTuesFri).  4. Secondary hyperparathyroidism - Cont sensipar and calcitiriol. Holding binders for low phos 5. HTN/volume -  BP variable, meds on hold.  6. Nutrition - renal diet, mult vit. Nepro.  7. Debility/ hx thoracic bony destruction T10/11 with chronic pain radicular pain radiates anteriorly, much like her pain from August admission (prior discitis, spinal stenosis, cord and nerve root compression, not a surgical candidate). On fentanyl patch. Getting prn morphine   Camille Balynthia Elida Harbin, MD [facility] [phone] Pager 12/13/2013, 7:46 AM

## 2013-12-13 NOTE — Clinical Social Work Note (Signed)
CSW received consult for SNF placement and patient assessed and in agreement with SNF. Facility preference indicated - [facility] and they can take patient. Full assessment to follow.  Genelle BalVanessa Brittay Mogle, MSW, LCSW [phone]

## 2013-12-13 NOTE — Progress Notes (Signed)
Received critical value of a hemoglobin result of 6.8, which has increased from 6.0 previous. Patient is Jehovah's Witness and has refused blood products. Dr Eliott Nineunham has addressed anemia. Patient has received 150 mcg of Aranesp 12/13/2013.

## 2013-12-13 NOTE — Progress Notes (Signed)
[facility]   Yevette EdwardsBarbara Middlesworth HYQ:[id number] DOB: January 15, 1936 DOA: 12/10/2013 PCP: Lise AuerKHAN,JABER A, MD  Brief narrative: 78 y.o. female with a history of end-stage renal disease undergoing hemodialysis on Mondays Wednesdays Fridays, chronic anemia, and hypertension who [facility] of generalized weakness with associated shortness of breath, progressively worsening over several days. She reported feeling too weak to undergo HD. On admission, hemoglobin was 6.4, creatinine of 4.5, potassium was 3.6 and bicarbonate 33. Patient is a Jehovah witness and declines having blood products.  HPI/Subjective: Seen on Dialysis, crying uncomfortable due to severe acute on chronic low back pain radiating down to the spine  Assessment/Plan:  Anemia in chronic kidney disease / Thrombocytopenia -Multifactorial etiology: Likely chronic component from chronic kidney disease but acute component unclear -Noted with mild thrombocytopenia since August -? due to consumption -Baseline hemoglobin around 8 with trends downward since August-most recent hemoglobin 6.0 on 11/22 -Heparin discontinued 11/22 because of thrombocytopenia -peripheral smear without schistocytes, LDH only minimally elevated, no argues against hemolysis, but Haptoglobin low so will check Coombs test -Anemia panel c/w chronic disease, which i suspect is the primary pathology here -her Hemoccult was negative at OSH prior to transfer - Patient is Jehovah witness and has declined blood products - caution with blood draws, should minimize and preserve counts - continue aranesp per Nephrology    ESRD (end stage renal disease) on dialysis - HD MWF - management per renal  - continue calcitrol, sensipar   Generalized weakness / Spinal stenosis, thoracic with nerve root compression -add gabapentin, fentanyl patch -repeat Xray LS spine -Pt  following -Current recommendations are for skilled nursing facility  DVT prophylaxis: SCDs Code Status: Full Family Communication: No family at bedside Disposition Plan/Expected LOS: Transfer to telemetry  Consultants: Nephrology/Dr. Eliott Nineunham  Objective: Blood pressure 188/94, pulse 85, temperature 98.2 F (36.8 C), temperature source Oral, resp. rate 20, height 4\' 11"  (1.499 m), weight 50 kg (110 lb 3.7 oz), SpO2 96 %.  Intake/Output Summary (Last 24 hours) at 12/13/13 1604 Last data filed at 12/13/13 1300  Gross per 24 hour  Intake    480 ml  Output   1484 ml  Net  -1004 ml   Exam: Gen: AAOx3, no distress, uncomfortable crying Chest: Clear to auscultation bilaterally without wheezes, rhonchi or crackles, room air Cardiac: Regular rate and rhythm, S1-S2, no rubs murmurs or gallops, no peripheral edema, no JVD Abdomen: Soft nontender nondistended without obvious hepatosplenomegaly, no ascites Extremities: Symmetrical in appearance without cyanosis, clubbing or effusion  Scheduled Meds:  Scheduled Meds: . calcitRIOL  0.75 mcg Oral Q M,W,F-HD  . cinacalcet  30 mg Oral Q breakfast  . Darbepoetin Alfa      . darbepoetin (ARANESP) injection - DIALYSIS  150 mcg Intravenous Q Tue-HD  . dicyclomine  20 mg Oral Q6H  . docusate sodium  100 mg Oral BID  . feeding supplement (NEPRO CARB STEADY)  237 mL Oral BID BM  . fentaNYL  25 mcg Transdermal Q72H  . gabapentin  100 mg Oral BID  . multivitamin  1 tablet Oral QHS  . pantoprazole  40 mg Oral Daily  . polyethylene glycol  17 g Oral Daily  . rOPINIRole  1 mg Oral QHS   Data Reviewed: Basic Metabolic Panel:  Recent Labs Lab 12/11/13 0200 12/11/13 0302 12/13/13 0710  NA  --  140 138  K  --  3.7 5.0  CL  --  101 100  CO2  --  28 26  GLUCOSE  --  95 93  BUN  --  15 23  CREATININE  --  2.52* 3.63*  CALCIUM  --  9.0 9.7  PHOS 2.9  --  3.4   Liver Function Tests:  Recent Labs Lab 12/13/13 0710  ALBUMIN 3.0*   No  results for input(s): LIPASE, AMYLASE in the last 168 hours. No results for input(s): AMMONIA in the last 168 hours.   CBC:  Recent Labs Lab 12/11/13 0302 12/13/13 0710  WBC 4.4 4.3  HGB 6.0* 6.8*  HCT 19.6* 22.0*  MCV 103.7* 108.9*  PLT 122* 121*   CBG:  Recent Labs Lab 12/10/13 2156  GLUCAP 89    Recent Results (from the past 240 hour(s))  MRSA PCR Screening     Status: None   Collection Time: 12/10/13  3:40 PM  Result Value Ref Range Status   MRSA by PCR NEGATIVE NEGATIVE Final    Comment:        The GeneXpert MRSA Assay (FDA approved for NASAL specimens only), is one component of a comprehensive MRSA colonization surveillance program. It is not intended to diagnose MRSA infection nor to guide or monitor treatment for MRSA infections.     If 7PM-7AM, please contact night-coverage [email or web address] Password [email or web address] 12/13/2013, 4:04 PM   LOS: 3 days   Zannie CovePreetha Rufino Staup, MD [facility]

## 2013-12-13 NOTE — Procedures (Signed)
I have personally attended this patient's dialysis session.   2K bath pending lab CBC pending also No heparin with HD AVF 400 BFR Wt 52.1 with 51 EDW goal 1.1 kg Anxious due to her chronic back pain-getting morphine Aranesp 150 mcg with HD  Camille Balynthia Bodee Lafoe, MD [facility] [phone] Pager 12/13/2013, 7:57 AM

## 2013-12-14 ENCOUNTER — Encounter (HOSPITAL_COMMUNITY): Payer: Self-pay | Admitting: Physician Assistant

## 2013-12-14 DIAGNOSIS — M4804 Spinal stenosis, thoracic region: Secondary | ICD-10-CM

## 2013-12-14 LAB — RETICULOCYTES
RBC.: 2.37 MIL/uL — ABNORMAL LOW (ref 3.87–5.11)
RETIC COUNT ABSOLUTE: 192 10*3/uL — AB (ref 19.0–186.0)
RETIC CT PCT: 8.1 % — AB (ref 0.4–3.1)

## 2013-12-14 LAB — CBC WITH DIFFERENTIAL/PLATELET
BASOS ABS: 0 10*3/uL (ref 0.0–0.1)
Basophils Relative: 0 % (ref 0–1)
EOS PCT: 1 % (ref 0–5)
Eosinophils Absolute: 0 10*3/uL (ref 0.0–0.7)
HCT: 26 % — ABNORMAL LOW (ref 36.0–46.0)
Hemoglobin: 8 g/dL — ABNORMAL LOW (ref 12.0–15.0)
Lymphocytes Relative: 21 % (ref 12–46)
Lymphs Abs: 0.8 10*3/uL (ref 0.7–4.0)
MCH: 33.2 pg (ref 26.0–34.0)
MCHC: 30.8 g/dL (ref 30.0–36.0)
MCV: 107.9 fL — AB (ref 78.0–100.0)
MONOS PCT: 13 % — AB (ref 3–12)
Monocytes Absolute: 0.5 10*3/uL (ref 0.1–1.0)
Neutro Abs: 2.4 10*3/uL (ref 1.7–7.7)
Neutrophils Relative %: 65 % (ref 43–77)
PLATELETS: 131 10*3/uL — AB (ref 150–400)
RBC: 2.41 MIL/uL — AB (ref 3.87–5.11)
RDW: 21.8 % — ABNORMAL HIGH (ref 11.5–15.5)
WBC: 3.7 10*3/uL — AB (ref 4.0–10.5)

## 2013-12-14 LAB — DIRECT ANTIGLOBULIN TEST (NOT AT ARMC)
DAT, COMPLEMENT: NEGATIVE
DAT, IgG: NEGATIVE

## 2013-12-14 LAB — VITAMIN B12: Vitamin B-12: 523 pg/mL (ref 211–911)

## 2013-12-14 LAB — SAVE SMEAR

## 2013-12-14 MED ORDER — DARBEPOETIN ALFA 150 MCG/0.3ML IJ SOSY: 150.0000 ug | PREFILLED_SYRINGE | INTRAMUSCULAR | Status: DC

## 2013-12-14 NOTE — Consult Note (Signed)
[facility] Consult: 11:07 AM 12/14/2013  LOS: 4 days    Referring Provider: Catarina Hartshorn  Primary Care Physician:  Lise Auer, MD at [facility] physicians in [place] Primary Gastroenterologist: Drs. in [place] and [facility].  She doesn't recall name of the doctors.    Reason for Consultation:  Anemia in a Jehovah's Witness   HPI: Julia Small is a 78 y.o. female.  Past medical history ESRD.  Degenerative spine disease.  History CVA with residual left-sided weakness.  Anemia of chronic disease. She dialyzes on Monday Wednesday Friday.  Admitted to [place] 4 days ago with weakness.  This was associated with shortness of breath. Onset had been over several days. She was so weak that she skipped dialysis.  At [facility] her hemoglobin was 6.4.  In mid August 2015 hemoglobin range was 10.7-11.5. Since admission it has ranged between 6 and 6.8. Her MCV previously was normal in August. However it is now up to 108.  Iron studies are showing normal iron level at 60, normal iron saturation and ferritin of 797. Her total iron binding capacity is diminished at 161. Stool was last tested for FOB on 08/22/13 and was negative. Renal has seen the patient and has increased her dose of Aranesp from 50 g weekly to weekly.  Renal has also ordered Venofer 5 doses to be given at dialysis. B12 and folate levels have not been checked. 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  Patient has some stable, chronic generalized abdominal pain. This was worked up with a CT angiogram of chest abdomen pelvis in August and there was no evidence for vascular disease. There is diverticulosis on imaging. She denies bloody stools, black or diarrheal stools.  She has not been vomiting.  She does not take nonsteroidal anti-inflammatories. Patient recalls having had upper endoscopy in [place] about 2 years ago. She has knowledge of reflux disease but her daily Nexium takes care of any symptoms.  She had colonoscopy at [facility] probably at least 5 years ago and recalls having diagnosis of internal hemorrhoids. She periodically will wipe and see scant amount of blood on the tissue after a bowel movement she denies having had any polyps or diverticulosis For quite a while she thinks at least a few months she's had dysphagia and choking location at the upper esophagus/pharynx. Liquids are more of a problem than solids. She's never had an esophageal stricture to her knowledge. For at least a year she says her weight has been stable at about 113 pounds postdialysis.    Patient also says that in 2001 she was told by the dialysis Center in [facility] that she was hepatitis C positive. When she moved to [place] a few years ago, she was checked and told that she was free of hepatitis C virus. She is not cognizant of any details and does not sound like she ever underwent any antiretroviral treatment.  I do not see any hepatitis serology testing in Epic.  Past Medical History  Diagnosis Date  . Hypertension   . GERD (gastroesophageal reflux disease)   . Carpal tunnel syndrome   . Secondary hyperparathyroidism (of renal origin)   . Anginal pain   . Pneumonia   . On home oxygen therapy     "2L prn" (09/02/2013)  . H/O hiatal hernia   . Anemia   . Hepatitis C     "I'm suppose to be clear"  . Headache(784.0)     "when I'm in pain" (09/02/2013)  . CVA (cerebral vascular accident)     residual left sided weakness  . Osteoarthritis     "back, knees, hand" (09/02/2013)  . Anxiety   . Depression   . ESRD (end stage renal disease) on dialysis 08/06/2012    Started HD in 2000 in [place], [place]. She moved to [facility] in 2009 and got HD in [place],  for about a  year then moved to [place] and has been getting HD there since. She is on MWF schedule. ESRD was caused by HTN.       Past Surgical History  Procedure Laterality Date  . Ankle reconstruction Left     "got 5 screws and 2 bolts in there"  . Cesarean section  X 3  . Sp av dialysis shunt access existing *l* Left   . Abdominal hysterectomy    . Cataract extraction, bilateral Bilateral   . Cardiac catheterization      Prior to Admission medications   Medication Sig Start Date End Date Taking? Authorizing Provider  amLODipine (NORVASC) 10 MG tablet Take 10 mg by mouth daily. 08/16/13  Yes Historical Provider, MD  bisacodyl (DULCOLAX) 5 MG EC tablet Take 5 mg by mouth daily as needed for moderate constipation.   Yes Historical Provider, MD  calcitRIOL (ROCALTROL) 0.25 MCG capsule Take 3 capsules (0.75 mcg total) by mouth every Monday, Wednesday, and Friday with hemodialysis. 09/09/13  Yes Belkys A Regalado, MD  carvedilol (COREG) 12.5 MG tablet Take 12.5 mg by mouth 2 (two) times daily with a meal.   Yes Historical Provider, MD  cloNIDine (CATAPRES) 0.1 MG tablet Take 0.1 mg by mouth daily.  12/20/12  Yes Historical Provider, MD  diclofenac sodium (VOLTAREN) 1 % GEL Apply 2 g topically as needed (for pain). 09/09/13  Yes Belkys A Regalado, MD  dicyclomine (BENTYL) 20 MG tablet Take 20 mg by mouth every 6 (six) hours.   Yes Historical Provider, MD  docusate sodium (COLACE) 100 MG capsule Take 100 mg by mouth 2 (two) times daily.   Yes Historical Provider, MD  esomeprazole (NEXIUM) 40 MG capsule Take 40 mg by mouth daily.   Yes Historical Provider, MD  losartan (COZAAR) 50 MG tablet Take 50 mg by mouth daily.    Yes Historical Provider, MD  Nutritional Supplements (FEEDING SUPPLEMENT, NEPRO CARB STEADY,) LIQD Take 237 mLs by mouth 2 (two) times daily between meals. 09/09/13  Yes Belkys A Regalado, MD  senna (SENOKOT) 8.6 MG TABS tablet Take 2 tablets by mouth 2 (two) times daily as needed for mild  constipation.    Yes Historical Provider, MD  SENSIPAR 30 MG tablet Take 30 mg by mouth daily. 08/11/13  Yes Historical Provider, MD  calcium acetate (PHOSLO) 667 MG capsule Take 2,001 mg by mouth 3 (three) times daily with meals.     Historical Provider, MD  dexamethasone (DECADRON) 4 MG tablet Take 1 tablet with meals twice a day for 3 days then 1 tablet daily  with meals for 2 days then stop. 09/09/13   Belkys A Regalado, MD  fentaNYL (DURAGESIC - DOSED MCG/HR) 25 MCG/HR patch Place 25 mcg onto the skin every 3 (three) days.  02/08/13   Historical Provider, MD  multivitamin (RENA-VIT) TABS tablet Take 1 tablet by mouth daily.     Historical Provider, MD  oxyCODONE (ROXICODONE) 5 MG immediate release tablet Take 1 tablet (5 mg total) by mouth every 4 (four) hours as needed for severe pain. 09/09/13   Belkys A Regalado, MD  rOPINIRole (REQUIP) 1 MG tablet Take 1 mg by mouth at bedtime.    Historical Provider, MD  zolpidem (AMBIEN) 5 MG tablet Take 5 mg by mouth at bedtime as needed for sleep.    Historical Provider, MD    Scheduled Meds: . calcitRIOL  0.75 mcg Oral Q M,W,F-HD  . cinacalcet  30 mg Oral Q breakfast  . darbepoetin (ARANESP) injection - DIALYSIS  150 mcg Intravenous Q Tue-HD  . dicyclomine  20 mg Oral Q6H  . docusate sodium  100 mg Oral BID  . feeding supplement (NEPRO CARB STEADY)  237 mL Oral BID BM  . fentaNYL  25 mcg Transdermal Q72H  . gabapentin  100 mg Oral BID  . multivitamin  1 tablet Oral QHS  . pantoprazole  40 mg Oral Daily  . polyethylene glycol  17 g Oral Daily  . rOPINIRole  1 mg Oral QHS   Infusions:   PRN Meds: acetaminophen **OR** acetaminophen, bisacodyl, morphine injection, ondansetron **OR** ondansetron (ZOFRAN) IV, oxyCODONE, senna, zolpidem   Allergies as of 12/09/2013 - Review Complete 09/02/2013  Allergen Reaction Noted  . Simvastatin Other (See Comments) 10/20/2012    History reviewed. No pertinent family history.  History   Social History    . Marital Status: Widowed    Spouse Name: N/A    Number of Children: N/A  . Years of Education: N/A   Occupational History  . Not on file.   Social History Main Topics  . Smoking status: Former Smoker -- 1.00 packs/day for 10 years    Types: Cigarettes  . Smokeless tobacco: Never Used     Comment: "stopped smoking in the 1980's  . Alcohol Use: Yes     Comment: 09/02/2013 'only drink at celebrations"  . Drug Use: No  . Sexual Activity: Not on file   Other Topics Concern  . 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   She rarely drinks alcohol, maybe 2 or 3 times a year she'll take a drink as part of a celebration    REVIEW OF SYSTEMS: Constitutional:  Generally weak and not very mobile due to spinal disease. She is currently considering entering a SNF rather than returning home where she lives alone ENT:  No nose bleeds Pulm:  No cough, no dyspnea, no PND CV:  No palpitations, no LE edema. No exertional chest pain GU: Patient is anuric. GI:  Per history of present illness Heme:  Patient has never been aware of having significant problems with low blood counts were been faced with decision to accept blood transfusions   Transfusions:  Never. Would decline transfusions even if medically necessary Neuro:  No headaches, no peripheral tingling or numbness.  She is hard of hearing Derm:  No itching, no rash or sores.  Endocrine:  No sweats  or chills.  No polyuria or dysuria Immunization:  Did not inquire Travel:  None beyond local counties in last few months.    PHYSICAL EXAM: Vital signs in last 24 hours: Filed Vitals:   12/14/13 0949  BP: 145/50  Pulse: 86  Temp: 98.5 F (36.9 C)  Resp: 20   Wt Readings from Last 3 Encounters:  12/13/13 110 lb 3.7 oz (50 kg)  09/09/13 112 lb 7 oz (51 kg)  08/23/13 115 lb (52.164 kg)    General: Frail,  alert, comfortable AAF Head:  No asymmetry, no swelling, no signs trauma  Eyes:  No icterus, pallor, EOMI. Ears:  HOH  Nose:  No congestion, no discharge Mouth:  She wears full dentures above. She only has a few incisors on the lower jaw and these are markedly askew Neck:  No JVD, no thyromegaly, no bruits Lungs:  No dyspnea. Does have a slight cough. Lungs are clear bilaterally Heart:  Heartbeat is irregular, there is a 2 to 3/6 systolic murmur. S1/S2 audible Abdomen:  Soft, nontender, nondistended. No masses no HSM.Marland Kitchen   Rectal: Deferred   Musc/Skeltl: Kyphosis. Extremities:  No lower extremity edema. Dialysis graft is located in left upper arm  Neurologic:  Oriented 3. No tremor. Limb strength not tested, gait not tested. Skin:  Skin is dry. No rash, sores or lesions Tattoos:  None seen Nodes:  No cervical adenopathy   Psych:  Pleasant, minimally anxious, cooperative  Intake/Output from previous day: 11/24 0701 - 11/25 0700 In: 360 [P.O.:360] Out: 1484  Intake/Output this shift: Total I/O In: 120 [P.O.:120] Out: -   LAB RESULTS:  Recent Labs  12/13/13 0710  WBC 4.3  HGB 6.8*  HCT 22.0*  PLT 121*   BMET Lab Results  Component Value Date   NA 138 12/13/2013   NA 140 12/11/2013   NA 135* 09/09/2013   K 5.0 12/13/2013   K 3.7 12/11/2013   K 4.1 09/09/2013   CL 100 12/13/2013   CL 101 12/11/2013   CL 94* 09/09/2013   CO2 26 12/13/2013   CO2 28 12/11/2013   CO2 22 09/09/2013   GLUCOSE 93 12/13/2013   GLUCOSE 95 12/11/2013   GLUCOSE 125* 09/09/2013   BUN 23 12/13/2013   BUN 15 12/11/2013   BUN 70* 09/09/2013   CREATININE 3.63* 12/13/2013   CREATININE 2.52* 12/11/2013   CREATININE 5.53* 09/09/2013   CALCIUM 9.7 12/13/2013   CALCIUM 9.0 12/11/2013   CALCIUM 8.6 09/09/2013   LFT  Recent Labs  12/13/13 0710  ALBUMIN 3.0*   PT/INR Lab Results  Component Value Date   INR 1.14 08/07/2012   Hepatitis Panel No results for input(s): HEPBSAG, HCVAB,  HEPAIGM, HEPBIGM in the last 72 hours. C-Diff No components found for: CDIFF Lipase     Component Value Date/Time   LIPASE 30 08/22/2013 1636    Drugs of Abuse  No results found for: LABOPIA, COCAINSCRNUR, LABBENZ, AMPHETMU, THCU, LABBARB   RADIOLOGY STUDIES: Dg Lumbar Spine Complete  12/13/2013   CLINICAL DATA:  Low back pain  EXAM: LUMBAR SPINE - COMPLETE 4+ VIEW  COMPARISON:  MRI T spine 09/02/2013, CT abdomen 09/02/2013 and 08/09/2012  FINDINGS: There are 5 nonrib bearing lumbar-type vertebral bodies. There is generalized osteopenia. The lumbar spine vertebral body heights are maintained. The alignment is anatomic. There is no spondylolysis. There is no acute fracture or static listhesis. There is endplate irregularity and sclerosis with disc space narrowing at L4-5.  There is severe disc  space loss and vertebral body bone destruction at T10-T11 consistent with history of osteomyelitis. There is kyphosis centered at T10-T11.  The SI joints are unremarkable.  IMPRESSION: 1. There is severe disc space loss and vertebral body bone destruction at T10-T11 consistent with history of osteomyelitis. There is kyphosis centered at T10-T11. 2. Endplate irregularity and sclerosis at L4-5 with mild disc space narrowing. These may reflect degenerative changes versus sequela of discitis. There is no significant change compared with 08/09/2012.   Electronically Signed   By: Elige KoHetal  Patel   On: 12/13/2013 18:46    ENDOSCOPIC STUDIES: We have no actual reports of any of her purported upper endoscopies and colonoscopies within the last several years  IMPRESSION:     *  Acute macrocytic anemia.  On scheduled outpatient Aranesp.  She is not iron deficient. Iron level is 60 and her ferritin level is 797. B12 and folate levels have not been checked  she is a Jehovah's Witness and is not amenable to blood transfusion  *  ESRD.  HD MWF   *  Dysphagia.  *  History hepatitis C? By CT angiogram of 08/22/2013,  liver is normal in appearance.  Her LFTs were normal in 8 2015. However the CT does show chronically dilated main pancreatic duct up to 4 mm which is stable compared with April 2015.  *   destructive spinal degeneration especially in the thoracic spine.  Chronic pain. She is not a surgical candidate for her spinal stenosis and cord/nerve root compression.   *  By CT angiogram of chest on 08/22/2013 has a 15 mm right middle lobe nodule which is described as gradually increased in size and density.  Follow-up in managing in 6 months was recommended.   PLAN:     *  Per Dr. Arlyce DiceKaplan. *  We'll work on trying to obtain records from previous endo and colon studies. *  If her hemoglobin stabilizes and she can discharged to home. She could follow-up with her GI doctor in [place] who performed EGD in past if it is determined she needs repeat upper endoscopy and/or colonoscopy..   *  Hemoccult a stool.  Check b12 and folate. . *  Dysphagia may be best worked up with a barium esophagram.    Jennye MoccasinSarah Gribbin  12/14/2013, 11:07 AM Pager: [phone]  GI Attending Note  Signing off.   Chart was reviewed and patient was examined. X-rays and lab were reviewed.  At this point there is no evidence for GI bleeding to account for pt's anemia.  Other minor GI complaints can be dealt with as an outpatient.  I would check serial hemeoccults, however.  Barbette Hairobert D. Arlyce DiceKaplan, M.D., [facility] Cell [phone]

## 2013-12-14 NOTE — Progress Notes (Addendum)
PROGRESS NOTE  Yevette EdwardsBarbara Rayl ZOX:[id number] DOB: 11/17/1935 DOA: 12/10/2013 PCP: Lise AuerKHAN,JABER A, MD  Assessment/Plan: Anemia in chronic kidney disease / Thrombocytopenia -Multifactorial etiology: Likely chronic component from chronic kidney disease but acute component unclear -Consult gastroenterology -Noted with mild thrombocytopenia since August -? due to consumption -Consult a GI -Baseline hemoglobin around 10-11 -Heparin discontinued 11/22 because of thrombocytopenia -peripheral smear without schistocytes, LDH only minimally elevated -Haptoglobin<25 -Direct Coombs test was negative -Anemia panel c/w chronic disease, which i suspect is the primary pathology here -her Hemoccult was negative at outside hospital prior to transfer - Patient is Jehovah witness and has declined blood products - caution with blood draws, should minimize and preserve counts - continue aranesp per Nephrology  -pt with hx of chronic leukopenia--suspect underlying MDS -check B12/ RBC folate/SPEP next lab draw -Platelet counts have remained stable ESRD (end stage renal disease) on dialysis - HD MWF - management per renal  - continue calcitrol, sensipar  Generalized weakness / Spinal stenosis, thoracic with nerve root compression -add gabapentin, fentanyl patch -repeat Xray LS spine--severe disc space loss with bone destruction T10-11, L4-L5 sclerosis -PT following-->SNF -Previous discitis treated with antibiotics--last seen by infectious disease in August 2015 who determined that the patient did not need any antimicrobial therapy   DVT prophylaxis: SCDs Code Status: Full Family Communication: No family at bedside Disposition Plan/Expected LOS: Transfer to telemetry  Consultants: Nephrology/Dr. Eliott Nineunham -GI--[place]   Family Communication:   Pt at beside Disposition Plan:   SNF      Procedures/Studies: Dg Lumbar Spine Complete  12/13/2013   CLINICAL DATA:  Low back pain   EXAM: LUMBAR SPINE - COMPLETE 4+ VIEW  COMPARISON:  MRI T spine 09/02/2013, CT abdomen 09/02/2013 and 08/09/2012  FINDINGS: There are 5 nonrib bearing lumbar-type vertebral bodies. There is generalized osteopenia. The lumbar spine vertebral body heights are maintained. The alignment is anatomic. There is no spondylolysis. There is no acute fracture or static listhesis. There is endplate irregularity and sclerosis with disc space narrowing at L4-5.  There is severe disc space loss and vertebral body bone destruction at T10-T11 consistent with history of osteomyelitis. There is kyphosis centered at T10-T11.  The SI joints are unremarkable.  IMPRESSION: 1. There is severe disc space loss and vertebral body bone destruction at T10-T11 consistent with history of osteomyelitis. There is kyphosis centered at T10-T11. 2. Endplate irregularity and sclerosis at L4-5 with mild disc space narrowing. These may reflect degenerative changes versus sequela of discitis. There is no significant change compared with 08/09/2012.   Electronically Signed   By: Elige KoHetal  Patel   On: 12/13/2013 18:46         Subjective: Patient complains of back pain but is controlled with oral opioids. Denies any chest pain, shortness breath, nausea, vomiting, diarrhea, hematochezia, melena. No fevers or chills.  Objective: Filed Vitals:   12/13/13 1230 12/13/13 1738 12/13/13 2038 12/14/13 0512  BP: 188/94 166/88 132/88 132/82  Pulse: 85 85 73 70  Temp: 98.2 F (36.8 C) 97.6 F (36.4 C) 98.3 F (36.8 C)   TempSrc: Oral Oral Oral   Resp: 20 20 18 19   Height:      Weight:      SpO2: 96% 96% 98% 98%    Intake/Output Summary (Last 24 hours) at 12/14/13 0827 Last data filed at 12/13/13 1300  Gross per 24 hour  Intake    360 ml  Output   1484 ml  Net  -  1124 ml   Weight change: -5.3 kg (-11 lb 11 oz) Exam:   General:  Pt is alert, follows commands appropriately, not in acute distress  HEENT: No icterus, No thrush,   [place]/AT  Cardiovascular: RRR, S1/S2, no rubs, no gallops  Respiratory: CTA bilaterally, no wheezing, no crackles, no rhonchi  Abdomen: Soft/+BS, mild epigastric tenderness without any guarding or rebound, non distended, no guarding  Extremities: No edema, No lymphangitis, No petechiae, No rashes, no synovitis  Data Reviewed: Basic Metabolic Panel:  Recent Labs Lab 12/11/13 0200 12/11/13 0302 12/13/13 0710  NA  --  140 138  K  --  3.7 5.0  CL  --  101 100  CO2  --  28 26  GLUCOSE  --  95 93  BUN  --  15 23  CREATININE  --  2.52* 3.63*  CALCIUM  --  9.0 9.7  PHOS 2.9  --  3.4   Liver Function Tests:  Recent Labs Lab 12/13/13 0710  ALBUMIN 3.0*   No results for input(s): LIPASE, AMYLASE in the last 168 hours. No results for input(s): AMMONIA in the last 168 hours. CBC:  Recent Labs Lab 12/11/13 0302 12/13/13 0710  WBC 4.4 4.3  HGB 6.0* 6.8*  HCT 19.6* 22.0*  MCV 103.7* 108.9*  PLT 122* 121*   Cardiac Enzymes: No results for input(s): CKTOTAL, CKMB, CKMBINDEX, TROPONINI in the last 168 hours. BNP: Invalid input(s): POCBNP CBG:  Recent Labs Lab 12/10/13 2156  GLUCAP 89    Recent Results (from the past 240 hour(s))  MRSA PCR Screening     Status: None   Collection Time: 12/10/13  3:40 PM  Result Value Ref Range Status   MRSA by PCR NEGATIVE NEGATIVE Final    Comment:        The GeneXpert MRSA Assay (FDA approved for NASAL specimens only), is one component of a comprehensive MRSA colonization surveillance program. It is not intended to diagnose MRSA infection nor to guide or monitor treatment for MRSA infections.      Scheduled Meds: . calcitRIOL  0.75 mcg Oral Q M,W,F-HD  . cinacalcet  30 mg Oral Q breakfast  . darbepoetin (ARANESP) injection - DIALYSIS  150 mcg Intravenous Q Tue-HD  . dicyclomine  20 mg Oral Q6H  . docusate sodium  100 mg Oral BID  . feeding supplement (NEPRO CARB STEADY)  237 mL Oral BID BM  . fentaNYL  25 mcg  Transdermal Q72H  . gabapentin  100 mg Oral BID  . multivitamin  1 tablet Oral QHS  . pantoprazole  40 mg Oral Daily  . polyethylene glycol  17 g Oral Daily  . rOPINIRole  1 mg Oral QHS   Continuous Infusions:    Friend Dorfman, DO  [facility] Pager [phone]  If 7PM-7AM, please contact night-coverage [email or web address] Password [email or web address] 12/14/2013, 8:27 AM   LOS: 4 days

## 2013-12-14 NOTE — Clinical Social Work Psychosocial (Signed)
Clinical Social Work Department BRIEF PSYCHOSOCIAL ASSESSMENT 12/14/2013  Patient:  Julia EdwardsCOFIELD,Kazzandra     Account Number:  [id number]     Admit date:  12/10/2013  Clinical Social Worker:  Delmer IslamRAWFORD,Braddock Servellon, LCSW  Date/Time:  12/14/2013 01:27 AM  Referred by:    Date Referred:  12/13/2013 Referred for  SNF Placement   Other Referral:   Interview type:  Patient Other interview type:    PSYCHOSOCIAL DATA Living Status:  ALONE Admitted from facility:   Level of care:   Primary support name:  Luisa HartKeith Cross Primary support relationship to patient:  CHILD, ADULT Degree of support available:   Patient stated that she has a son and 6 grandchildren. Level of support unknown. Son's mobile number - [phone].    CURRENT CONCERNS Current Concerns  Post-Acute Placement   Other Concerns:    SOCIAL WORK ASSESSMENT / PLAN CSW talked with patient on 12/13/13  at the bedside regarding discharge planning and recommendation of ST rehab. Patient is alert and oriented and was agreeable to talking with CSW although she had just gotten back from dialysis and wanted to eat her breakfast. Patient agreeable to ST rehab and [facility] [facility]   Assessment/plan status:  Psychosocial Support/Ongoing Assessment of Needs Other assessment/ plan:   Information/referral to community resources:   None needed or requested at this time.    PATIENT'S/FAMILY'S RESPONSE TO PLAN OF CARE: Ms. Glade NurseCofield is agreeable to ST rehab and provided a facility preference.

## 2013-12-14 NOTE — Clinical Social Work Placement (Addendum)
Clinical Social Work Department CLINICAL SOCIAL WORK PLACEMENT NOTE 12/14/2013  Patient:  Julia EdwardsCOFIELD,Bernetha  Account Number:  [id number] Admit date:  12/10/2013  Clinical Social Worker:  Genelle BalVANESSA Yahir Tavano, LCSW  Date/time:  12/14/2013 01:36 AM  Clinical Social Work is seeking post-discharge placement for this patient at the following level of care:   SKILLED NURSING   (*CSW will update this form in Epic as items are completed)     Patient/family provided with [facility] Department of Clinical Social Work's list of facilities offering this level of care within the geographic area requested by the patient (or if unable, by the patient's family).  12/13/2013  Patient/family informed of their freedom to choose among providers that offer the needed level of care, that participate in Medicare, Medicaid or managed care program needed by the patient, have an available bed and are willing to accept the patient.    Patient/family informed of [facility]' ownership interest in [facility], as well as of the fact that they are under no obligation to receive care at this facility.  PASARR submitted to EDS in 2013  PASARR number received in 2013   FL2 transmitted to all facilities in geographic area requested by pt/family on  12/13/2013 FL2 transmitted to all facilities within larger geographic area on   Patient informed that his/her managed care company has contracts with or will negotiate with  certain facilities, including the following:     Patient/family informed of bed offers received:  12/14/2013 Patient chooses bed at [facility] Physician recommends and patient chooses bed at    Patient to be transferred to [facility] on Friday, 12/16/13  Patient to be transferred to facility by ambulance Patient and family notified of transfer on 12/16/13 Name of family member notified: Left message for son Luisa HartKeith Cross [phone]) to call SW or front desk regarding  patient's discharge.   The following physician request were entered in Epic:   Additional Comments:

## 2013-12-14 NOTE — Progress Notes (Signed)
Physical Therapy Treatment Patient Details Name: Julia EdwardsBarbara Small MRN: [id number] DOB: Nov 22, 1935 Today's Date: 12/14/2013    History of Present Illness 78 y.o. female admitted with abdominal and back pain. Current problem list includes: ESRD, anemia, metabolic bone disease, with Hx of CVA.    PT Comments    Pt progressing slowly towards physical therapy goals. Pt was limited by fear of falling and anxiety with mobility. Pt was returned to the bed for peri-care as she had bowel incontinence with sit<>stand. Slide board may be beneficial next session for increased independence with transfers. Will continue to follow.   Follow Up Recommendations  SNF;Supervision/Assistance - 24 hour     Equipment Recommendations   (Sliding board)    Recommendations for Other Services       Precautions / Restrictions Precautions Precautions: Fall Restrictions Weight Bearing Restrictions: No    Mobility  Bed Mobility Overal bed mobility: Needs Assistance Bed Mobility: Sit to Supine       Sit to supine: Mod assist   General bed mobility comments: Assist for LE elevation back into the bed. +2 total assist to [facility] to [facility]   Transfers Overall transfer level: Needs assistance Equipment used: Rolling walker (2 wheeled) Transfers: Sit to/from [employer];Squat Pivot Transfers Sit to Stand: +2 physical assistance;Max assist   Squat pivot transfers: Total assist     General transfer comment: RW and +2 assist for sit>stand. Buckling of knees noted and manual support provided to keep knees in extension.   Ambulation/Gait             General Gait Details: Pt was not able to take steps at this time due to knee instability and increased anxiety.    Stairs            Wheelchair Mobility    Modified Rankin (Stroke Patients Only)       Balance Overall balance assessment: Needs assistance;History of Falls Sitting-balance support: Feet supported;No upper extremity supported Sitting  balance-Leahy Scale: Fair     Standing balance support: Bilateral upper extremity supported;During functional activity Standing balance-Leahy Scale: Zero                      Cognition Arousal/Alertness: Awake/alert Behavior During Therapy: Anxious Overall Cognitive Status: Within Functional Limits for tasks assessed                      Exercises      General Comments        Pertinent Vitals/Pain Pain Assessment: Faces Faces Pain Scale: Hurts even more Pain Location: Pt reports back pain which appears to come in spasms. Pain Intervention(s): Limited activity within patient's tolerance;Monitored during session    Home Living                      Prior Function            PT Goals (current goals can now be found in the care plan section) Acute Rehab PT Goals Patient Stated Goal: to get a power w/c PT Goal Formulation: With patient Time For Goal Achievement: 12/26/13 Potential to Achieve Goals: Good Progress towards PT goals: Progressing toward goals    Frequency  Min 2X/week    PT Plan Current plan remains appropriate    Co-evaluation             End of Session Equipment Utilized During Treatment: Gait belt Activity Tolerance: Patient limited by fatigue;Patient limited by pain Patient left:  in bed;with bed alarm set;with call bell/phone within reach     Time: 1324-1350 PT Time Calculation (min) (ACUTE ONLY): 26 min  Charges:  $Gait Training: 8-22 mins $Therapeutic Activity: 8-22 mins                    G Codes:      Julia Small, Julia Small 12/14/2013, 2:08 PM  Julia SlipperLaura Shriyan Small, PT, DPT Acute Rehabilitation Services Pager: [phone]

## 2013-12-14 NOTE — Progress Notes (Signed)
[facility] Rounding Note Subjective:  Feeling slightly better today, less anxiety, wants to get out of bed (PT later today), concerned about finding the right SNF  Objective: Vital signs in last 24 hours: Temp:  [97.1 F (36.2 C)-98.3 F (36.8 C)] 98.3 F (36.8 C) (11/24 2038) Pulse Rate:  [70-85] 70 (11/25 0512) Resp:  [18-24] 19 (11/25 0512) BP: (132-188)/(39-94) 132/82 mmHg (11/25 0512) SpO2:  [96 %-100 %] 98 % (11/25 0512) Weight:  [50 kg (110 lb 3.7 oz)] 50 kg (110 lb 3.7 oz) (11/24 1123) Weight change: -5.3 kg (-11 lb 11 oz)  Intake/Output from previous day: 11/24 0701 - 11/25 0700 In: 360 [P.O.:360] Out: 1484  Intake/Output this shift:   EXAM: General appearance:  Alert, in no apparent distress Sitting up in the chair. As upbeat as I've seen her in a long time. Resp:  CTA without rales, rhonchi, or wheezes Cardio:  RRR with Gr II/VI systolic murmur, no rub GI: + BS, soft and nontender Extremities:  No edema Access:  AVF @ LUA with + bruit  Lab Results:  Recent Labs  12/13/13 0710  WBC 4.3  HGB 6.8*  HCT 22.0*  PLT 121*   BMET:  Recent Labs  12/13/13 0710  NA 138  K 5.0  CL 100  CO2 26  GLUCOSE 93  BUN 23  CREATININE 3.63*  CALCIUM 9.7  ALBUMIN 3.0*   Iron Studies:  Recent Labs  12/11/13 1052  IRON 60  TIBC 161*  FERRITIN 797*    Studies/Results: Dg Lumbar Spine Complete  12/13/2013   CLINICAL DATA:  Low back pain  EXAM: LUMBAR SPINE - COMPLETE 4+ VIEW  COMPARISON:  MRI T spine 09/02/2013, CT abdomen 09/02/2013 and 08/09/2012  FINDINGS: There are 5 nonrib bearing lumbar-type vertebral bodies. There is generalized osteopenia. The lumbar spine vertebral body heights are maintained. The alignment is anatomic. There is no spondylolysis. There is no acute fracture or static listhesis. There is endplate irregularity and sclerosis with disc space narrowing at L4-5.  There is severe disc space loss and vertebral body bone destruction at  T10-T11 consistent with history of osteomyelitis. There is kyphosis centered at T10-T11.  The SI joints are unremarkable.  IMPRESSION: 1. There is severe disc space loss and vertebral body bone destruction at T10-T11 consistent with history of osteomyelitis. There is kyphosis centered at T10-T11. 2. Endplate irregularity and sclerosis at L4-5 with mild disc space narrowing. These may reflect degenerative changes versus sequela of discitis. There is no significant change compared with 08/09/2012.   Electronically Signed   By: Elige KoHetal  Patel   On: 12/13/2013 18:46   Scheduled Medications . calcitRIOL  0.75 mcg Oral Q M,W,F-HD  . cinacalcet  30 mg Oral Q breakfast  . [START ON 12/21/2013] darbepoetin (ARANESP) injection - DIALYSIS  150 mcg Intravenous Q Wed-HD  . dicyclomine  20 mg Oral Q6H  . docusate sodium  100 mg Oral BID  . feeding supplement (NEPRO CARB STEADY)  237 mL Oral BID BM  . fentaNYL  25 mcg Transdermal Q72H  . gabapentin  100 mg Oral BID  . multivitamin  1 tablet Oral QHS  . pantoprazole  40 mg Oral Daily  . polyethylene glycol  17 g Oral Daily  . rOPINIRole  1 mg Oral QHS   HD: MWF [facility] 4h 400/600 51kg 2/2.0 Bath LUA AVF Heparin none  Hect 4 ug TIW, Aranesp 50 ug / wk, increased to 150/week this adm Venofer 100 mg /  hd x 5 thru 11/25  Assessment/Plan: 1. Anemia - symptomatic, Hgb 6 on admission, now up to 6.8; previously on Aranesp 50 mcg qwk at outpatient center, now responding to 150 mcg qwk (received a dose 11/24); no transfusions (Jehovah's Witness) or Heparin; Fe sat 37%, plts stable @ 121. GI evaluating as well. 2. ESRD - HD on MWF @ [facility], pre-HD K 5.  Next HD 11/27. 3. HTN/Volume - BP 132/82, no meds; wt 50 kg s/p net UF 1.5 L yesterday, @ EDW. 4. Sec HPT - Ca 9.7 (10.5 corrected), P 3.4; Calcitriol 0.75 mcg, Sensipar 30 mg qd, no binders. 5. Nutrition - Alb 3, renal diet, vitamin. 6. Debility - Hx thoracic bony destruction T10-11 with chronic radicular pain,  radiates anteriorly similar to pain during August admission (prior diskitis, spinal stenosis, cord and nerve root compression, not a surgical candidate). On fentanyl patch & PRN morphine.  7. Dispo - probable SNF when all w/u complete   LOS: 4 days   LYLES,CHARLES 12/14/2013,7:32 AM  I have seen and examined this patient and agree with plan and assessment in the above note with highlighted additions.   Dreana Britz B,MD 12/14/2013 11:38 AM

## 2013-12-15 LAB — FOLATE RBC: RBC FOLATE: 1295 ng/mL — AB (ref >280)

## 2013-12-15 MED ORDER — GABAPENTIN 100 MG PO CAPS: 100.0000 mg | ORAL_CAPSULE | Freq: Two times a day (BID) | ORAL | Status: AC

## 2013-12-15 MED ORDER — FENTANYL 25 MCG/HR TD PT72: 25.0000 ug | MEDICATED_PATCH | TRANSDERMAL | Status: AC

## 2013-12-15 MED ORDER — OXYCODONE HCL 5 MG PO TABS: 5.0000 mg | ORAL_TABLET | ORAL | Status: AC | PRN

## 2013-12-15 NOTE — Evaluation (Signed)
Clinical/Bedside Swallow Evaluation Patient Details  Name: Julia Small MRN: [id number] Date of Birth: 25-Apr-1935  Today's Date: 12/15/2013 Time: 1302-1322 SLP Time Calculation (min) (ACUTE ONLY): 20 min  Past Medical History:  Past Medical History  Diagnosis Date  . Hypertension   . GERD (gastroesophageal reflux disease)   . Carpal tunnel syndrome   . Secondary hyperparathyroidism (of renal origin)   . Anginal pain   . Pneumonia   . On home oxygen therapy     "2L prn" (09/02/2013)  . H/O hiatal hernia   . Anemia   . Hepatitis C     "I'm suppose to be clear"  . Headache(784.0)     "when I'm in pain" (09/02/2013)  . CVA (cerebral vascular accident)     residual left sided weakness  . Osteoarthritis     "back, knees, hand" (09/02/2013)  . Anxiety   . Depression   . ESRD (end stage renal disease) on dialysis 08/06/2012    [place]. [place] [facility] [place] and got HD in [place], [place] for about a year then moved to [place] and has been getting HD there since. She is on MWF schedule. ESRD was caused by HTN.      Past Surgical History:  Past Surgical History  Procedure Laterality Date  . Ankle reconstruction Left     "got 5 screws and 2 bolts in there"  . Cesarean section  X 3  . Sp av dialysis shunt access existing *l* Left   . Abdominal hysterectomy    . Cataract extraction, bilateral Bilateral   . Cardiac catheterization     HPI:  Pt is a 78 y.o. female with PMH end stage renal disease undergoing hemodialysis, chronic anemia, hypertention. [facility] with generalized weakness and SOB worsening over past several days. [facility]. PMH also positive for PNA, GERD, CVA resulting in residual L side weakness. Bedside swallow eval ordered 11/25.   Assessment / Plan / Recommendation Clinical Impression  The pt demonstrated no overt s/s of aspiration at bedside. Swallow appeared timely with adequate  hyolaryngeal excursion. Observed pt consume about 50% of lunch with no difficulties. The pt is very frustrated with positioning in bed for meal- needed to use left hand to stay upright and R hand to eat. Recommend continuing regular diet, thin liquids, meds whole with liquid or in puree if difficulties arise. If possible, consider transferring pt to chair for meals to increase ease with feeding. Pt would benefit from full supervision during meals if possible to aide in feeding as well. No acute needs identified with swallow function- ST will sign off. Please re-consult if needs arise.    Aspiration Risk  Mild    Diet Recommendation Regular;Thin liquid   Liquid Administration via: Cup;Straw Medication Administration: Whole meds with liquid Supervision: Staff to assist with self feeding Compensations: Slow rate;Small sips/bites Postural Changes and/or Swallow Maneuvers: Seated upright 90 degrees;Out of bed for meals (in chair for meals if possible)    Other  Recommendations Oral Care Recommendations: Oral care BID   Follow Up Recommendations  None    Frequency and Duration        Pertinent Vitals/Pain n/a    SLP Swallow Goals     Swallow Study Prior Functional Status       General HPI: Pt is a 78 y.o. female with PMH end stage renal disease undergoing hemodialysis, chronic anemia, hypertention. [facility] with generalized weakness and SOB worsening over past several days. [facility]. PMH also positive for PNA, GERD, CVA resulting in residual L side weakness. Bedside swallow eval ordered 11/25. Type of Study: Bedside swallow evaluation Diet Prior to this Study: Regular;Thin liquids Temperature Spikes Noted: Yes Respiratory Status: Room air History of Recent Intubation: No Behavior/Cognition: Alert;Cooperative Oral Cavity - Dentition: Poor condition;Missing dentition Self-Feeding Abilities: Able to feed self;Needs assist Patient  Positioning: Upright in bed Baseline Vocal Quality: Clear Volitional Cough: Strong Volitional Swallow: Able to elicit    Oral/Motor/Sensory Function Overall Oral Motor/Sensory Function: Appears within functional limits for tasks assessed   Ice Chips Ice chips: Not tested   Thin Liquid Thin Liquid: Within functional limits Presentation: Cup;Straw    Nectar Thick Nectar Thick Liquid: Not tested   Honey Thick Honey Thick Liquid: Not tested   Puree Puree: Within functional limits Presentation: Self Fed;Spoon   Solid   GO    Solid: Within functional limits Presentation: Self Fed       Oleksiak, Amy K, MA, CCC-SLP 12/15/2013,1:28 PM

## 2013-12-15 NOTE — Progress Notes (Signed)
[facility] Rounding Note Subjective:   Tired, no complaints  Objective Filed Vitals:   12/14/13 0949 12/14/13 2053 12/15/13 0613 12/15/13 0923  BP: 145/50 120/58 100/51 106/56  Pulse: 86 75 72 72  Temp: 98.5 F (36.9 C) 98.6 F (37 C) 100.1 F (37.8 C) 100.8 F (38.2 C)  TempSrc: Oral Oral  Oral  Resp: 20 18 18 19   Height:      Weight:      SpO2: 98% 97% 97% 97%   Physical Exam General: drowsy, arousable, oriented. No acute distress.  Heart: RRR  Lungs:CTA, unlabored. shallow Abdomen: soft, nontender +BS  Extremities: no edema  Dialysis Access: L avf+b/t  HD: MWF [facility] 4h 400/600 51kg 2/2.0 Bath LUA AVF Heparin none  Hect 4 ug TIW, Aranesp 50 ug / wk, increased to 150/week this adm Venofer 100 mg / hd x 5 thru 11/25   Assessment/Plan: 1. Anemia - symptomatic, Hgb 6 on admission, now up to 8; previously on Aranesp 50 mcg qwk at outpatient center, now responding to 150 mcg qwk (received a dose 11/24); no transfusions (Jehovah's Witness) or Heparin; Fe sat 37%, plts stable @ 121. GI evaluating -if hgb stable follow up w outpt GI for possible EGD/colonoscopy 2. ESRD - HD on MWF @ [facility], pre-HD K 5. Next HD 11/27. 3. HTN/Volume - BP 106/56 no meds; 1 kg under edw. 4. Sec HPT - Ca 9.7 (10.5 corrected), P 3.4; Calcitriol 0.75 mcg, Sensipar 30 mg qd, no binders. 5. Nutrition - Alb 3, renal diet, vitamin. 6. 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 On fentanyl patch & PRN morphine.  7. Dispo - probable SNF when all w/u complete  Jetty DuhamelBridget Whelan, NP [facility] [phone] 12/15/2013,9:29 AM  LOS: 5 days  I have seen and examined this patient and agree with plan and assessment in the above note of BWhelan. Hb responding to higher dose of Aranesp. Next  HD on Friday. Recheck Hb then. Anticipating discharge to SNF in next day or so. Susa Bones B,MD 12/15/2013 10:35 AM   Additional Objective Labs: Basic Metabolic Panel:  Recent Labs Lab 12/11/13 0200 12/11/13 0302 12/13/13 0710  NA  --  140 138  K  --  3.7 5.0  CL  --  101 100  CO2  --  28 26  GLUCOSE  --  95 93  BUN  --  15 23  CREATININE  --  2.52* 3.63*  CALCIUM  --  9.0 9.7  PHOS 2.9  --  3.4   Liver Function Tests:  Recent Labs Lab 12/13/13 0710  ALBUMIN 3.0*    Recent Labs Lab 12/11/13 0302 12/13/13 0710 12/14/13 1435  WBC 4.4 4.3 3.7*  NEUTROABS  --   --  2.4  HGB 6.0* 6.8* 8.0*  HCT 19.6* 22.0* 26.0*  MCV 103.7* 108.9* 107.9*  PLT 122* 121* 131*   Blood Culture    Component Value Date/Time   SDES BLOOD RIGHT HAND 09/04/2013 1305   SPECREQUEST BOTTLES DRAWN AEROBIC ONLY 10CC 09/04/2013 1305   CULT  09/04/2013 1305    NO GROWTH 5 DAYS Performed at [facility]   REPTSTATUS 09/10/2013 FINAL 09/04/2013 1305   Recent Labs Lab 12/10/13 2156  GLUCAP 89  Studies/Results: Dg Lumbar Spine Complete  12/13/2013   CLINICAL DATA:  Low back pain  EXAM: LUMBAR  SPINE - COMPLETE 4+ VIEW  COMPARISON:  MRI T spine 09/02/2013, CT abdomen 09/02/2013 and 08/09/2012  FINDINGS: There are 5 nonrib bearing lumbar-type vertebral bodies. There is generalized osteopenia. The lumbar spine vertebral body heights are maintained. The alignment is anatomic. There is no spondylolysis. There is no acute fracture or static listhesis. There is endplate irregularity and sclerosis with disc space narrowing at L4-5.  There is severe disc space loss and vertebral body bone destruction at T10-T11 consistent with history of osteomyelitis. There is kyphosis centered at T10-T11.  The SI joints are unremarkable.  IMPRESSION: 1. There is severe disc space loss and vertebral body bone destruction at T10-T11 consistent with history of osteomyelitis. There is kyphosis centered at T10-T11.  2. Endplate irregularity and sclerosis at L4-5 with mild disc space narrowing. These may reflect degenerative changes versus sequela of discitis. There is no significant change compared with 08/09/2012.   Electronically Signed   By: Elige KoHetal  Patel   On: 12/13/2013 18:46   Medications:   . calcitRIOL  0.75 mcg Oral Q M,W,F-HD  . cinacalcet  30 mg Oral Q breakfast  . [START ON 12/21/2013] darbepoetin (ARANESP) injection - DIALYSIS  150 mcg Intravenous Q Wed-HD  . dicyclomine  20 mg Oral Q6H  . docusate sodium  100 mg Oral BID  . feeding supplement (NEPRO CARB STEADY)  237 mL Oral BID BM  . fentaNYL  25 mcg Transdermal Q72H  . gabapentin  100 mg Oral BID  . multivitamin  1 tablet Oral QHS  . pantoprazole  40 mg Oral Daily  . polyethylene glycol  17 g Oral Daily  . rOPINIRole  1 mg Oral QHS

## 2013-12-15 NOTE — Discharge Summary (Signed)
Physician Discharge Summary  Julia Small YNW:[id number] DOB: [birth year] DOA: 12/10/2013  PCP: Lise Auer, MD  Admit date: 12/10/2013 Discharge date: 12/16/13 Recommendations for Outpatient Follow-up:  1. Pt will need to follow up with PCP in 2 weeks post discharge 2. Please obtain BMP and CBC in one week   Discharge Diagnoses:  Anemia in chronic kidney disease / Thrombocytopenia -Multifactorial etiology: Likely chronic component from chronic kidney disease but acute component unclear -Consult gastroenterology -Noted with mild thrombocytopenia since August -Consult a GI--seen by Dr. Arlyce Dice who did not feel that there is any evidence of GI bleed to account for patient's anemia -Patient will need to follow-up with her gastroenterologist in [facility] for further workup -Baseline hemoglobin around 10-11  -12/15/2013--discussed with hematology--Dr. Quinn Axe to concerns for possible hemolysis  -Dr. Clelia Croft stated that although the patient may have a component of nonimmune mediated hemolysis, he was not completely convinced that the patient truly has hemolysis based upon the laboratory data  -He recommended recheck LDH, bilirubin and to treat with steroids only if there is significant increases in LDH and bilirubin  -12/16/2013 LDH 250, total bilirubin 0.5, hemoglobin 7.5 - 12/14/2013 --Reticulocyte count 192, -As the patient's LDH, total bilirubin, and hemoglobin remained stable, the patient was discharged. I have called the [facility] to schedule an outpatient follow-up appointment for the patient's pancytopenia--since Dr. Bertis Ruddy is out of town, they will contact the patient after discharge to schedule the appointment -Heparin discontinued 11/22 because of thrombocytopenia -peripheral smear without schistocytes, LDH only minimally elevated -Haptoglobin<25 -Direct Coombs test was negative -her Hemoccult was negative at outside hospital prior to  transfer - Patient is Jehovah witness and has declined blood products - caution with blood draws, should minimize and preserve counts - continue aranesp per Nephrology  -pt with hx of chronic leukopenia--suspect underlying MDS -check B12--523 -RBC folate--1295 -Platelet counts have remained stable ESRD (end stage renal disease) on dialysis - HD MWF - management per renal  - continue calcitrol, sensipar  Generalized weakness / Spinal stenosis, thoracic with nerve root compression -add gabapentin, fentanyl patch -repeat Xray LS spine--severe disc space loss with bone destruction T10-11, L4-L5 sclerosis -PT following-->SNF -Previous discitis treated with antibiotics--last seen by infectious disease in August 2015 who determined that the patient did not need any antimicrobial therapy  Hypertension -The patient's blood pressure has been well controlled off her antihypertensive medications -Therefore, clonidine, losartan, amlodipine, carvedilol have been discontinued -Her blood pressure was monitored throughout the hospitalization -Although the patient had some intermittent high blood pressure readings, the preponderance of her readings have been acceptable  GERD -Continue PPI   History of Bacterial endocarditis  -2-D echo showed no obvious vegetations on 09/04/2013   Code Status: Full Family Communication: No family at bedside Dispo--to SNF if stable on 12/16/13 Consultants: Nephrology/Dr. Eliott Nine -GI--[place]  Discharge Condition: Stable  Disposition:      Follow-up Information    Follow up with [facility], NI, MD In 1 month.   Specialty:  Hematology and Oncology   Contact information:   [address] [place] [address] [phone]      skilled nursing facility  Diet: Renal diet with 1200 mL fluid restriction Wt Readings from Last 3 Encounters:  12/16/13 52.6 kg (115 lb 15.4 oz)  09/09/13 51 kg (112 lb 7 oz)  08/23/13 52.164 kg (115 lb)    History of  present illness:  78 y.o. female with a past medical history of end-stage renal disease undergoing hemodialysis  on Mondays Wednesdays Fridays, history of chronic anemia, hypertension, presenting as a [facility]. [facility] presented to [facility] with complaints of generalized weakness with associated shortness of breath, progressively worsening over the past several days. She reported feeling too weak to even undergo hemodialysis yesterday for which she skipped this session. She has a complaints of ongoing abdominal pain which she confirms is chronic. Lab work performed at [facility] showed a hemoglobin of 6.4, hematocrit of 20.4. She was found to have a creatinine of 4.5, BUN 35, potassium 3.6 and bicarbonate 33. Patient reporting being a Jehovah Witness and does not want to receive blood products. She denies hematemesis, bloody stools, bright red blood per rectum.  Nephrology was consulted to manage the patient's dialysis. Because of the patient's acute anemia, gastroenterology was consulted. They do not feel that there was a GI source for the patient's anemia. As a result, further studies were obtained which suggested possible hemolysis with a low haptoglobin and mildly elevated LDH and reticulocyte count. The case was discussed with hematology, Dr. Clelia CroftShadad. Repeat LDH, hemoglobin, and bilirubin were obtained all of which have remained stable.    Discharge Exam: Filed Vitals:   12/16/13 1000  BP: 94/60  Pulse: 84  Temp:   Resp: 17   Filed Vitals:   12/16/13 0820 12/16/13 0900 12/16/13 0930 12/16/13 1000  BP: 129/65 128/88 124/72 94/60  Pulse: 76 77 82 84  Temp:      TempSrc:      Resp: 20 18 25 17   Height:      Weight:      SpO2:       General: A&O x 3, NAD, pleasant, cooperative Cardiovascular: RRR, no rub, no gallop, no S3 Respiratory: CTAB, no wheeze, no rhonchi Abdomen:soft, nontender, nondistended, positive bowel sounds Extremities: No edema, No  lymphangitis, no petechiae  Discharge Instructions     Medication List    STOP taking these medications        amLODipine 10 MG tablet  Commonly known as:  NORVASC     carvedilol 12.5 MG tablet  Commonly known as:  COREG     cloNIDine 0.1 MG tablet  Commonly known as:  CATAPRES     dexamethasone 4 MG tablet  Commonly known as:  DECADRON     losartan 50 MG tablet  Commonly known as:  COZAAR      TAKE these medications        bisacodyl 5 MG EC tablet  Commonly known as:  DULCOLAX  Take 5 mg by mouth daily as needed for moderate constipation.     calcitRIOL 0.25 MCG capsule  Commonly known as:  ROCALTROL  Take 3 capsules (0.75 mcg total) by mouth every Monday, Wednesday, and Friday with hemodialysis.     calcium acetate 667 MG capsule  Commonly known as:  PHOSLO  Take 2,001 mg by mouth 3 (three) times daily with meals.     diclofenac sodium 1 % Gel  Commonly known as:  VOLTAREN  Apply 2 g topically as needed (for pain).     dicyclomine 20 MG tablet  Commonly known as:  BENTYL  Take 20 mg by mouth every 6 (six) hours.     docusate sodium 100 MG capsule  Commonly known as:  COLACE  Take 100 mg by mouth 2 (two) times daily.     esomeprazole 40 MG capsule  Commonly known as:  NEXIUM  Take 40 mg by mouth daily.  feeding supplement (NEPRO CARB STEADY) Liqd  Take 237 mLs by mouth 2 (two) times daily between meals.     fentaNYL 25 MCG/HR patch  Commonly known as:  DURAGESIC - dosed mcg/hr  Place 1 patch (25 mcg total) onto the skin every 3 (three) days.     gabapentin 100 MG capsule  Commonly known as:  NEURONTIN  Take 1 capsule (100 mg total) by mouth 2 (two) times daily.     multivitamin Tabs tablet  Take 1 tablet by mouth daily.     oxyCODONE 5 MG immediate release tablet  Commonly known as:  ROXICODONE  Take 1 tablet (5 mg total) by mouth every 4 (four) hours as needed for severe pain.     rOPINIRole 1 MG tablet  Commonly known as:  REQUIP  Take  1 mg by mouth at bedtime.     senna 8.6 MG Tabs tablet  Commonly known as:  SENOKOT  Take 2 tablets by mouth 2 (two) times daily as needed for mild constipation.     SENSIPAR 30 MG tablet  Generic drug:  cinacalcet  Take 30 mg by mouth daily.     zolpidem 5 MG tablet  Commonly known as:  AMBIEN  Take 5 mg by mouth at bedtime as needed for sleep.         The results of significant diagnostics from this hospitalization (including imaging, microbiology, ancillary and laboratory) are listed below for reference.    Significant Diagnostic Studies: Dg Lumbar Spine Complete  12/13/2013   CLINICAL DATA:  Low back pain  EXAM: LUMBAR SPINE - COMPLETE 4+ VIEW  COMPARISON:  MRI T spine 09/02/2013, CT abdomen 09/02/2013 and 08/09/2012  FINDINGS: There are 5 nonrib bearing lumbar-type vertebral bodies. There is generalized osteopenia. The lumbar spine vertebral body heights are maintained. The alignment is anatomic. There is no spondylolysis. There is no acute fracture or static listhesis. There is endplate irregularity and sclerosis with disc space narrowing at L4-5.  There is severe disc space loss and vertebral body bone destruction at T10-T11 consistent with history of osteomyelitis. There is kyphosis centered at T10-T11.  The SI joints are unremarkable.  IMPRESSION: 1. There is severe disc space loss and vertebral body bone destruction at T10-T11 consistent with history of osteomyelitis. There is kyphosis centered at T10-T11. 2. Endplate irregularity and sclerosis at L4-5 with mild disc space narrowing. These may reflect degenerative changes versus sequela of discitis. There is no significant change compared with 08/09/2012.   Electronically Signed   By: Elige Ko   On: 12/13/2013 18:46     Microbiology: Recent Results (from the past 240 hour(s))  MRSA PCR Screening     Status: None   Collection Time: 12/10/13  3:40 PM  Result Value Ref Range Status   MRSA by PCR NEGATIVE NEGATIVE Final     Comment:        The GeneXpert MRSA Assay (FDA approved for NASAL specimens only), is one component of a comprehensive MRSA colonization surveillance program. It is not intended to diagnose MRSA infection nor to guide or monitor treatment for MRSA infections.      Labs: Basic Metabolic Panel:  Recent Labs Lab 12/11/13 0200  12/11/13 0302 12/13/13 0710 12/16/13 0800  NA  --   --  140 138 138  K  --   < > 3.7 5.0 4.6  CL  --   --  101 100 98  CO2  --   --  28 26  26  GLUCOSE  --   --  95 93 80  BUN  --   --  15 23 33*  CREATININE  --   --  2.52* 3.63* 5.42*  CALCIUM  --   --  9.0 9.7 9.8  PHOS 2.9  --   --  3.4 4.2  < > = values in this interval not displayed. Liver Function Tests:  Recent Labs Lab 12/13/13 0710 12/16/13 0800  AST  --  13  ALT  --  7  ALKPHOS  --  89  BILITOT  --  0.5  PROT  --  6.1  ALBUMIN 3.0* 2.8*  2.7*   No results for input(s): LIPASE, AMYLASE in the last 168 hours. No results for input(s): AMMONIA in the last 168 hours. CBC:  Recent Labs Lab 12/11/13 0302 12/13/13 0710 12/14/13 1435 12/16/13 0800  WBC 4.4 4.3 3.7* 4.4  NEUTROABS  --   --  2.4  --   HGB 6.0* 6.8* 8.0* 7.5*  HCT 19.6* 22.0* 26.0* 24.8*  MCV 103.7* 108.9* 107.9* 108.8*  PLT 122* 121* 131* 141*   Cardiac Enzymes: No results for input(s): CKTOTAL, CKMB, CKMBINDEX, TROPONINI in the last 168 hours. BNP: Invalid input(s): POCBNP CBG:  Recent Labs Lab 12/10/13 2156  GLUCAP 89    Time coordinating discharge:  Greater than 30 minutes  Signed:  Elsye Mccollister, DO [facility] Pager: [phone] 12/16/2013, 10:55 AM

## 2013-12-15 NOTE — Plan of Care (Signed)
Problem: Phase I Progression Outcomes Goal: Initial discharge plan identified Outcome: Completed/Met Date Met:  12/15/13     

## 2013-12-15 NOTE — Progress Notes (Signed)
PROGRESS NOTE  Yevette EdwardsBarbara Willcox ZOX:[id number] DOB: 11/04/1935 DOA: 12/10/2013 PCP: Lise AuerKHAN,JABER A, MD  Assessment/Plan: Anemia in chronic kidney disease / Thrombocytopenia -Multifactorial etiology: Likely chronic component from chronic kidney disease but acute component unclear -Consult gastroenterology -Noted with mild thrombocytopenia since August -? due to consumption -Consult a GI--seen by Dr. Arlyce DiceKaplan who did not feel that there is any evidence of GI bleed to account for patient's anemia -Baseline hemoglobin around 10-11  -12/15/2013--discussed with hematology--Dr. Quinn AxeShadad--due to concerns for possible hemolysis  -Dr. Clelia CroftShadad stated that although the patient may have a component of nonimmune mediated hemolysis, he was not completely convinced that the patient truly has hemolysis based upon the laboratory data  -He recommended recheck LDH, bilirubin and to treat with steroids only if there is significant increases in LDH and bilirubin  -Reticulocyte count 192, -Heparin discontinued 11/22 because of thrombocytopenia -peripheral smear without schistocytes, LDH only minimally elevated -Haptoglobin<25 -Direct Coombs test was negative -her Hemoccult was negative at outside hospital prior to transfer - Patient is Jehovah witness and has declined blood products - caution with blood draws, should minimize and preserve counts - continue aranesp per Nephrology  -pt with hx of chronic leukopenia--suspect underlying MDS -check B12--523 -RBC folate--pending -Platelet counts have remained stable ESRD (end stage renal disease) on dialysis - HD MWF - management per renal  - continue calcitrol, sensipar  Generalized weakness / Spinal stenosis, thoracic with nerve root compression -add gabapentin, fentanyl patch -repeat Xray LS spine--severe disc space loss with bone destruction T10-11, L4-L5 sclerosis -PT following-->SNF -Previous discitis treated with antibiotics--last seen by  infectious disease in August 2015 who determined that the patient did not need any antimicrobial therapy   Code Status: Full Family Communication: No family at bedside Dispo--to SNF if stable on 12/16/13 Consultants: Nephrology/Dr. Eliott Nineunham -GI--[place]         Procedures/Studies: Dg Lumbar Spine Complete  12/13/2013   CLINICAL DATA:  Low back pain  EXAM: LUMBAR SPINE - COMPLETE 4+ VIEW  COMPARISON:  MRI T spine 09/02/2013, CT abdomen 09/02/2013 and 08/09/2012  FINDINGS: There are 5 nonrib bearing lumbar-type vertebral bodies. There is generalized osteopenia. The lumbar spine vertebral body heights are maintained. The alignment is anatomic. There is no spondylolysis. There is no acute fracture or static listhesis. There is endplate irregularity and sclerosis with disc space narrowing at L4-5.  There is severe disc space loss and vertebral body bone destruction at T10-T11 consistent with history of osteomyelitis. There is kyphosis centered at T10-T11.  The SI joints are unremarkable.  IMPRESSION: 1. There is severe disc space loss and vertebral body bone destruction at T10-T11 consistent with history of osteomyelitis. There is kyphosis centered at T10-T11. 2. Endplate irregularity and sclerosis at L4-5 with mild disc space narrowing. These may reflect degenerative changes versus sequela of discitis. There is no significant change compared with 08/09/2012.   Electronically Signed   By: Elige KoHetal  Patel   On: 12/13/2013 18:46         Subjective: Patient denies fevers, chills, headache, chest pain, dyspnea, nausea, vomiting, diarrhea, abdominal pain, dysuria, hematuria   Objective: Filed Vitals:   12/14/13 0949 12/14/13 2053 12/15/13 0613 12/15/13 0923  BP: 145/50 120/58 100/51 106/56  Pulse: 86 75 72 72  Temp: 98.5 F (36.9 C) 98.6 F (37 C) 100.1 F (37.8 C) 100.8 F (38.2 C)  TempSrc: Oral Oral  Oral  Resp: 20 18 18 19   Height:  Weight:      SpO2: 98% 97% 97% 97%   No  intake or output data in the 24 hours ending 12/15/13 1126 Weight change:  Exam:   General:  Pt is alert, follows commands appropriately, not in acute distress  HEENT: No icterus, No thrush,[place]/AT  Cardiovascular: RRR, S1/S2, no rubs, no gallops  Respiratory: CTA bilaterally, no wheezing, no crackles, no rhonchi  Abdomen: Soft/+BS, non tender, non distended, no guarding  Extremities: No edema, No lymphangitis, No petechiae, No rashes, no synovitis  Data Reviewed: Basic Metabolic Panel:  Recent Labs Lab 12/11/13 0200 12/11/13 0302 12/13/13 0710  NA  --  140 138  K  --  3.7 5.0  CL  --  101 100  CO2  --  28 26  GLUCOSE  --  95 93  BUN  --  15 23  CREATININE  --  2.52* 3.63*  CALCIUM  --  9.0 9.7  PHOS 2.9  --  3.4   Liver Function Tests:  Recent Labs Lab 12/13/13 0710  ALBUMIN 3.0*   No results for input(s): LIPASE, AMYLASE in the last 168 hours. No results for input(s): AMMONIA in the last 168 hours. CBC:  Recent Labs Lab 12/11/13 0302 12/13/13 0710 12/14/13 1435  WBC 4.4 4.3 3.7*  NEUTROABS  --   --  2.4  HGB 6.0* 6.8* 8.0*  HCT 19.6* 22.0* 26.0*  MCV 103.7* 108.9* 107.9*  PLT 122* 121* 131*   Cardiac Enzymes: No results for input(s): CKTOTAL, CKMB, CKMBINDEX, TROPONINI in the last 168 hours. BNP: Invalid input(s): POCBNP CBG:  Recent Labs Lab 12/10/13 2156  GLUCAP 89    Recent Results (from the past 240 hour(s))  MRSA PCR Screening     Status: None   Collection Time: 12/10/13  3:40 PM  Result Value Ref Range Status   MRSA by PCR NEGATIVE NEGATIVE Final    Comment:        The GeneXpert MRSA Assay (FDA approved for NASAL specimens only), is one component of a comprehensive MRSA colonization surveillance program. It is not intended to diagnose MRSA infection nor to guide or monitor treatment for MRSA infections.      Scheduled Meds: . calcitRIOL  0.75 mcg Oral Q M,W,F-HD  . cinacalcet  30 mg Oral Q breakfast  . [START ON  12/21/2013] darbepoetin (ARANESP) injection - DIALYSIS  150 mcg Intravenous Q Wed-HD  . dicyclomine  20 mg Oral Q6H  . docusate sodium  100 mg Oral BID  . feeding supplement (NEPRO CARB STEADY)  237 mL Oral BID BM  . fentaNYL  25 mcg Transdermal Q72H  . gabapentin  100 mg Oral BID  . multivitamin  1 tablet Oral QHS  . pantoprazole  40 mg Oral Daily  . polyethylene glycol  17 g Oral Daily  . rOPINIRole  1 mg Oral QHS   Continuous Infusions:    Blayze Haen, DO  [facility] Pager [phone]  If 7PM-7AM, please contact night-coverage [email or web address] Password [email or web address] 12/15/2013, 11:26 AM   LOS: 5 days

## 2013-12-16 DIAGNOSIS — D61818 Other pancytopenia: Secondary | ICD-10-CM

## 2013-12-16 LAB — CBC
HEMATOCRIT: 24.8 % — AB (ref 36.0–46.0)
HEMOGLOBIN: 7.5 g/dL — AB (ref 12.0–15.0)
MCH: 32.9 pg (ref 26.0–34.0)
MCHC: 30.2 g/dL (ref 30.0–36.0)
MCV: 108.8 fL — ABNORMAL HIGH (ref 78.0–100.0)
Platelets: 141 10*3/uL — ABNORMAL LOW (ref 150–400)
RBC: 2.28 MIL/uL — ABNORMAL LOW (ref 3.87–5.11)
RDW: 21.5 % — ABNORMAL HIGH (ref 11.5–15.5)
WBC: 4.4 10*3/uL (ref 4.0–10.5)

## 2013-12-16 LAB — RENAL FUNCTION PANEL
Albumin: 2.7 g/dL — ABNORMAL LOW (ref 3.5–5.2)
Anion gap: 14 (ref 5–15)
BUN: 33 mg/dL — AB (ref 6–23)
CHLORIDE: 98 meq/L (ref 96–112)
CO2: 26 meq/L (ref 19–32)
CREATININE: 5.42 mg/dL — AB (ref 0.50–1.10)
Calcium: 9.8 mg/dL (ref 8.4–10.5)
GFR calc Af Amer: 8 mL/min — ABNORMAL LOW (ref >90)
GFR, EST NON AFRICAN AMERICAN: 7 mL/min — AB (ref >90)
GLUCOSE: 80 mg/dL (ref 70–99)
Phosphorus: 4.2 mg/dL (ref 2.3–4.6)
Potassium: 4.6 mEq/L (ref 3.7–5.3)
Sodium: 138 mEq/L (ref 137–147)

## 2013-12-16 LAB — HEPATIC FUNCTION PANEL
ALBUMIN: 2.8 g/dL — AB (ref 3.5–5.2)
ALT: 7 U/L (ref 0–35)
AST: 13 U/L (ref 0–37)
Alkaline Phosphatase: 89 U/L (ref 39–117)
BILIRUBIN TOTAL: 0.5 mg/dL (ref 0.3–1.2)
Bilirubin, Direct: <0.2 mg/dL (ref 0.0–0.3)
TOTAL PROTEIN: 6.1 g/dL (ref 6.0–8.3)

## 2013-12-16 LAB — LACTATE DEHYDROGENASE: LDH: 250 U/L (ref 94–250)

## 2013-12-16 MED ORDER — CALCITRIOL 1 MCG/ML IV SOLN
INTRAVENOUS | Status: AC
  Filled 2013-12-16: qty 1

## 2013-12-16 NOTE — Progress Notes (Signed)
[facility] Rounding Note Subjective:  Seen on dialysis No new issues Still "worrying about those blood counts" (labs not back yet this AM)  Objective Filed Vitals:   12/15/13 0923 12/15/13 1859 12/15/13 2010 12/16/13 0643  BP: 106/56 125/75 114/69 118/63  Pulse: 72 129 75 86  Temp: 100.8 F (38.2 C) 98.2 F (36.8 C) 98.1 F (36.7 C) 99.1 F (37.3 C)  TempSrc: Oral Oral Oral Oral  Resp: 19 20 18 17   Height:      Weight:      SpO2: 97% 100% 99% 96%   Physical Exam General: Awake, alert, pleasant  Heart: Regular S1S2 No S3 2/6 systolic murmur base  Lungs:Clear Abdomen: soft, nontender +BS  Extremities: no edema  Dialysis Access: L avf+b/t - currently cannulated for dialysis  Recent Labs  12/14/13 1435  WBC 3.7*  NEUTROABS 2.4  HGB 8.0*  HCT 26.0*  MCV 107.9*  PLT 131*    Recent Labs  12/14/13 1405  VITAMINB12 523  RETICCTPCT 8.1*   Medications:   . calcitRIOL  0.75 mcg Oral Q M,W,F-HD  . cinacalcet  30 mg Oral Q breakfast  . [START ON 12/21/2013] darbepoetin (ARANESP) injection - DIALYSIS  150 mcg Intravenous Q Wed-HD  . dicyclomine  20 mg Oral Q6H  . docusate sodium  100 mg Oral BID  . feeding supplement (NEPRO CARB STEADY)  237 mL Oral BID BM  . fentaNYL  25 mcg Transdermal Q72H  . gabapentin  100 mg Oral BID  . multivitamin  1 tablet Oral QHS  . pantoprazole  40 mg Oral Daily  . polyethylene glycol  17 g Oral Daily  . rOPINIRole  1 mg Oral QHS   HD: MWF [facility] 4h 400/600 51kg will prob be a bit lower at discharge) 2/2.0 Bath LUA AVF Heparin none  Hect 4 ug TIW, Aranesp 50 ug / wk, increased to 150/week this adm Venofer 100 mg / hd x 5 thru 11/25  Assessment/Plan: 1. Anemia - symptomatic, Hgb 6 on admission, last up to 8 and pending from today; previously on Aranesp 50 mcg qwk at outpatient center, now responding to 150 mcg qwk (received a dose 11/24); no transfusions (Jehovah's Witness) No Heparin; Fe sat 37%, plts stable @  131. GI evaluated - if hgb stable follow up w outpt GI for possible EGD/colonoscopy. Low haptoglobin and sl elev LDH without schiscocytes - Dr. Arbutus Leasat spoke with Dr. Clelia CroftShadad who doubts sig hemolysis. With low WBC and sl low plts as well may be element MDS 2. ESRD - HD on MWF @ [facility] today. 3. HTN/Volume - BP 106/56 no meds. Has generally been under EDW. (50.6 after last tmt) 4. Sec HPT - Ca 9.7 (10.5 corrected), P 3.4; Calcitriol 0.75 mcg, Sensipar 30 mg qd, no binders. 5. Nutrition - Alb 3, renal diet, vitamin. 6. 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 On fentanyl patch & PRN morphine.  7. Dispo - probable SNF when all w/u complete (?possibly today)  Julia Balynthia Deaysia Grigoryan, MD [facility] [phone] Pager 12/16/2013, 8:36 AM

## 2013-12-16 NOTE — Procedures (Signed)
I have personally attended this patient's dialysis session.   2K bath pending lab AVF BFR 400 No heparin CBC pending for Hb f/u (last 8) BP stable  Camille Balynthia Rashon Rezek, MD [facility] [phone] Pager 12/16/2013, 8:39 AM

## 2013-12-16 NOTE — Care Management Note (Signed)
CARE MANAGEMENT NOTE 12/16/2013  Patient:  Grabski,Suzzanne   Account Number:  [id number]  Date Initiated:  12/12/2013  Documentation initiated by:  SHAVIS,ALESIA  Subjective/Objective Assessment:   anemia     Action/Plan:   home oxygen, home alone  12/16/2013 Plan to d/c to SNF today.   Anticipated DC Date:  12/16/2013   Anticipated DC Plan:  SKILLED NURSING FACILITY      DC Planning Services  CM consult      Choice offered to / List presented to:             Status of service:  Completed, signed off Medicare Important Message given?  YES (If response is "NO", the following Medicare IM given date fields will be blank) Date Medicare IM given:  12/12/2013 Medicare IM given by:  SHAVIS,ALESIA Date Additional Medicare IM given:  12/16/2013 Additional Medicare IM given by:  Akua Blethen  Discharge Disposition:  SKILLED NURSING FACILITY  Per UR Regulation:    If discussed at Long Length of Stay Meetings, dates discussed:    Comments:  12/12/2013 1541 Recommendation is for SNF. Alesia Shavis RN CCM Case Mgmt phone [phone]   

## 2013-12-16 NOTE — Progress Notes (Signed)
Report called to Stephanie at [facility].  All questions answered.  

## 2013-12-19 ENCOUNTER — Telehealth: Payer: Self-pay | Admitting: Oncology

## 2013-12-19 NOTE — Telephone Encounter (Signed)
Called pt lvm in ref to np appt @ [facility].

## 2014-03-21 DEATH — deceased

## 2015-04-09 IMAGING — CR DG LUMBAR SPINE COMPLETE 4+V
5 series · 5 of 5 positions shown · non-contrast
Comparison: MRI T spine 09/02/2013, CT abdomen 09/02/2013 and
08/09/2012

CLINICAL DATA: Low back pain

EXAM:
LUMBAR SPINE - COMPLETE 4+ VIEW

[l-spine ap]
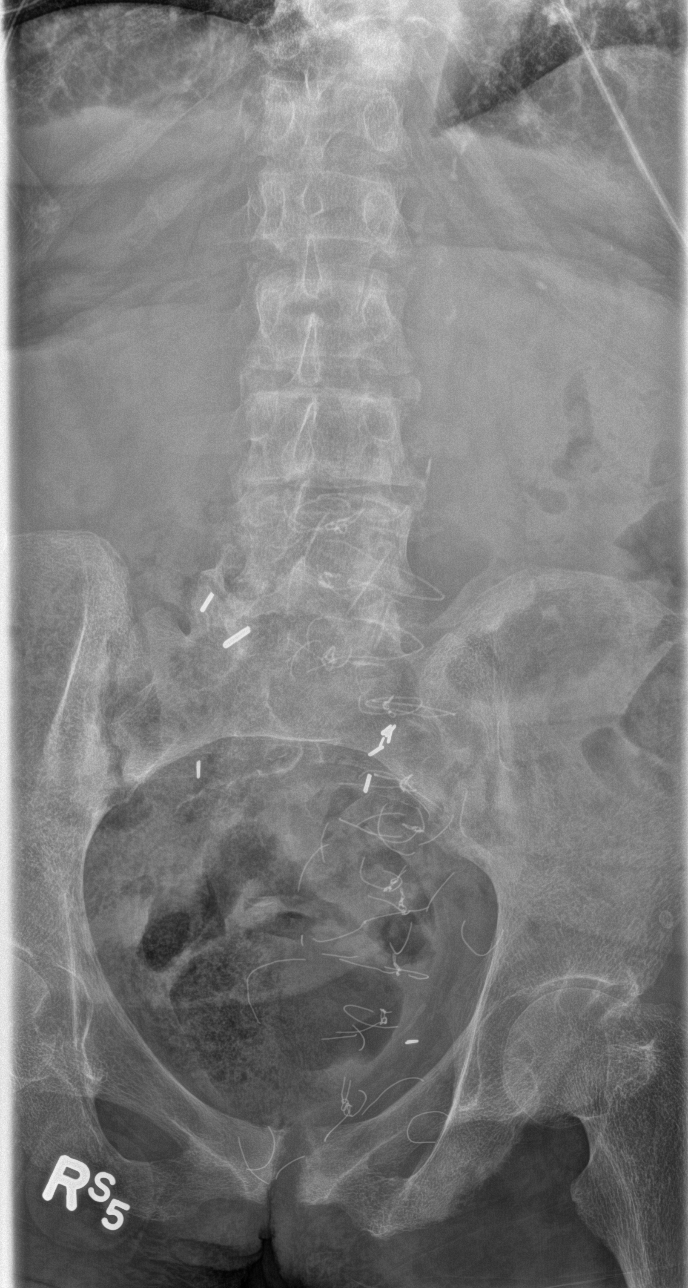

[l-spine obl (1 of 2)]
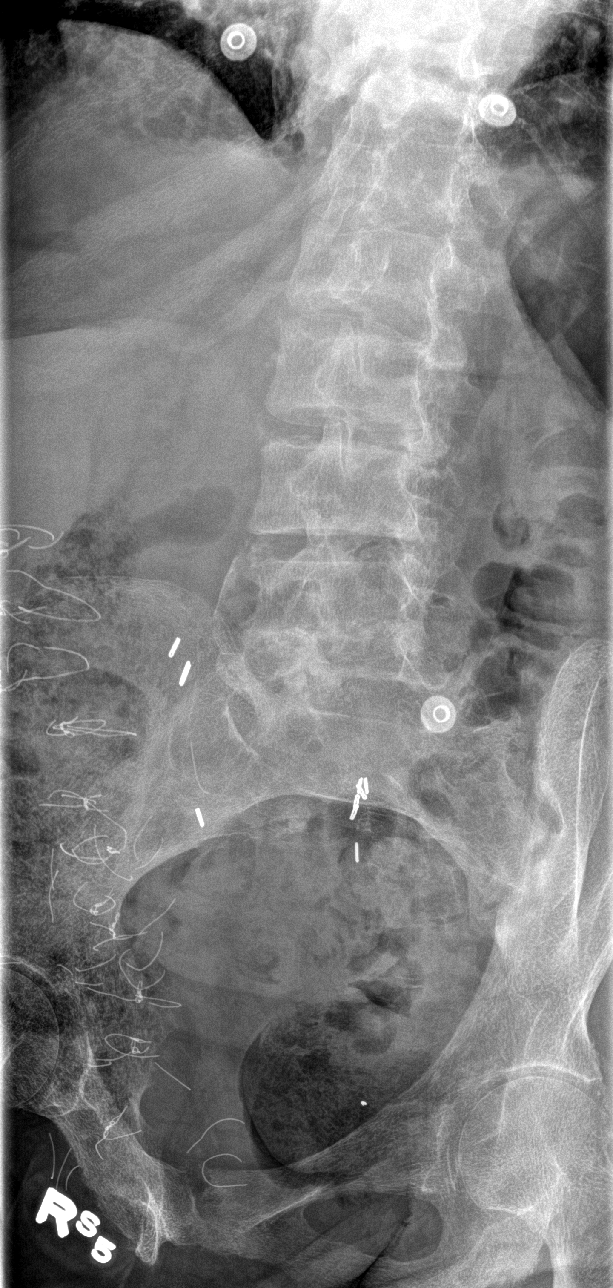

[l-spine obl (2 of 2)]
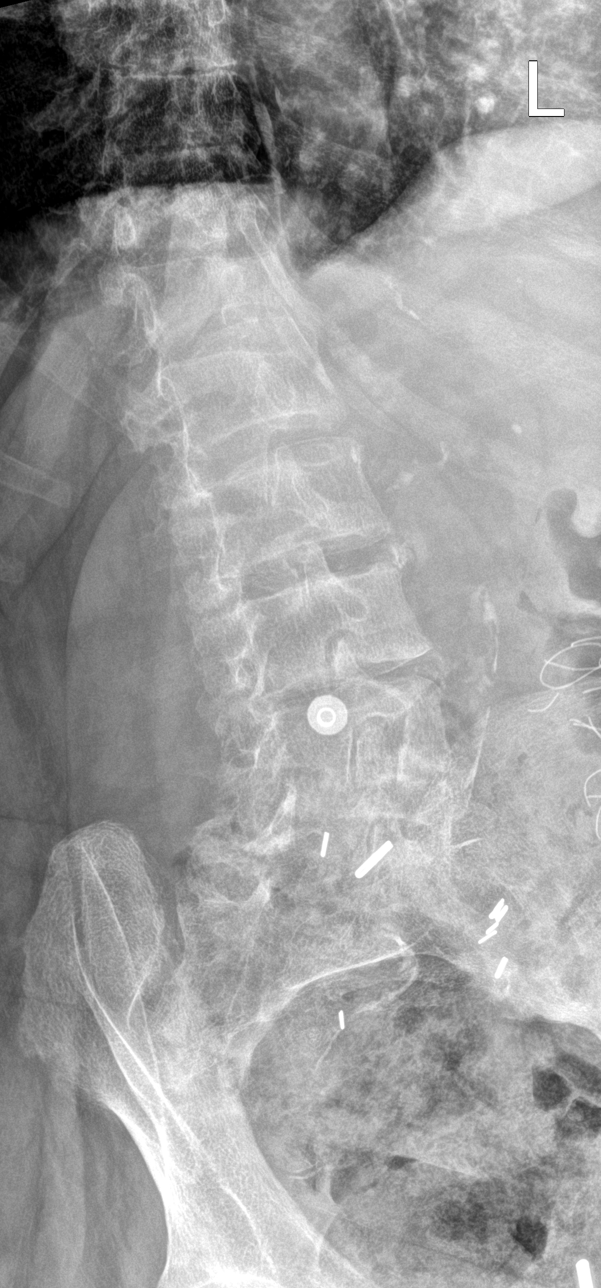

[l-spine lat]
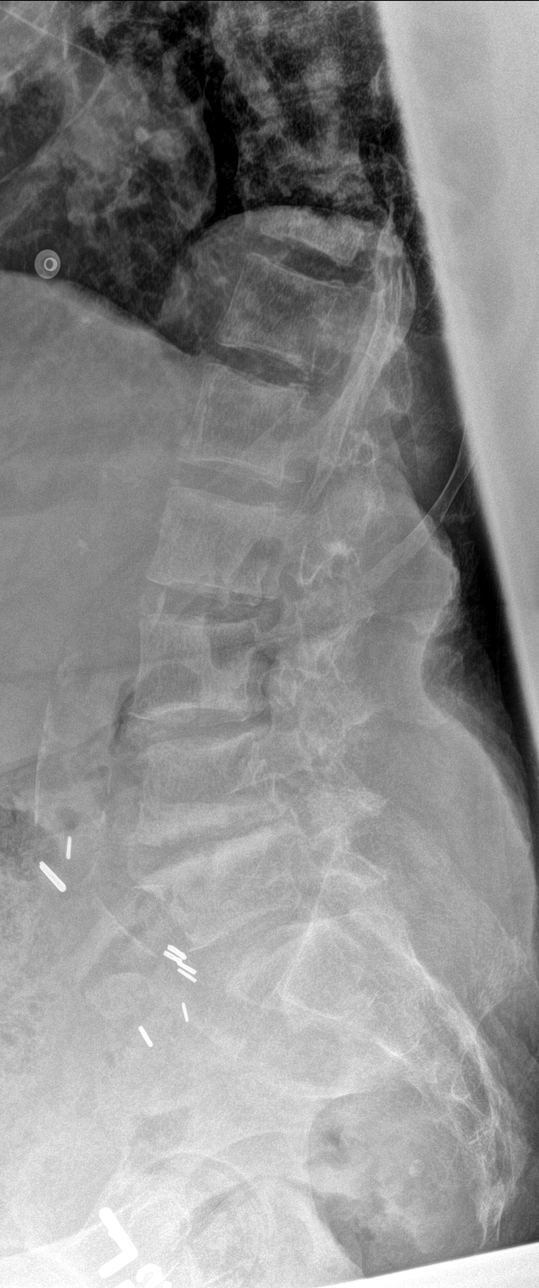

[l-spine spot]
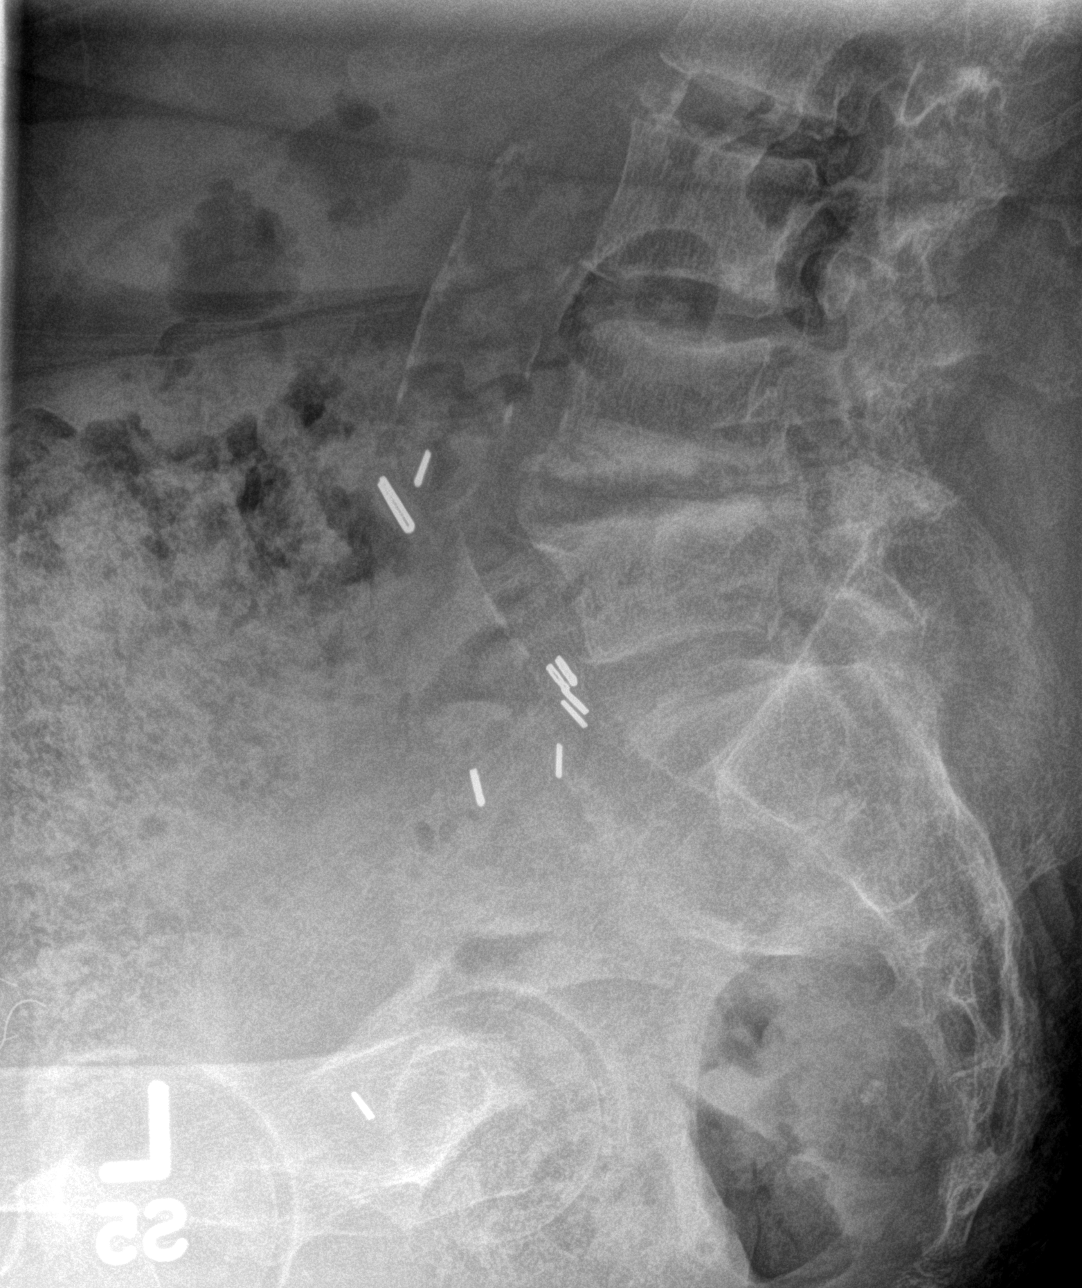

[5 of 5 positions shown; findings below may reference images not displayed]

FINDINGS: There are 5 nonrib bearing lumbar-type vertebral bodies. There is
generalized osteopenia. The lumbar spine vertebral body heights are
maintained. The alignment is anatomic. There is no spondylolysis.
There is no acute fracture or static listhesis. There is endplate
irregularity and sclerosis with disc space narrowing at L4-5.

There is severe disc space loss and vertebral body bone destruction
at T10-T11 consistent with history of osteomyelitis. There is
kyphosis centered at T10-T11.

The SI joints are unremarkable.
IMPRESSION: 1. There is severe disc space loss and vertebral body bone
destruction at T10-T11 consistent with history of osteomyelitis.
There is kyphosis centered at T10-T11.
2. Endplate irregularity and sclerosis at L4-5 with mild disc space
narrowing. These may reflect degenerative changes versus sequela of
discitis. There is no significant change compared with 08/09/2012.
